# Patient Record
Sex: Male | Born: 1935 | Race: Black or African American | Hispanic: No | Marital: Single | Smoking: Former smoker
Health system: Southern US, Community
[De-identification: ages and names within clinical notes are randomized; demographics above are authoritative.]

## PROBLEM LIST (undated history)

## (undated) DIAGNOSIS — I10 Essential (primary) hypertension: Secondary | ICD-10-CM

## (undated) DIAGNOSIS — R0602 Shortness of breath: Secondary | ICD-10-CM

## (undated) DIAGNOSIS — J449 Chronic obstructive pulmonary disease, unspecified: Secondary | ICD-10-CM

## (undated) DIAGNOSIS — K219 Gastro-esophageal reflux disease without esophagitis: Secondary | ICD-10-CM

## (undated) DIAGNOSIS — M1990 Unspecified osteoarthritis, unspecified site: Secondary | ICD-10-CM

## (undated) DIAGNOSIS — H9190 Unspecified hearing loss, unspecified ear: Secondary | ICD-10-CM

## (undated) HISTORY — DX: Gastro-esophageal reflux disease without esophagitis: K21.9

## (undated) HISTORY — PX: EYE SURGERY: SHX253

## (undated) HISTORY — DX: Essential (primary) hypertension: I10

---

## 1998-01-07 HISTORY — PX: CYSTOSCOPY: SHX5120

## 2000-12-16 ENCOUNTER — Ambulatory Visit (HOSPITAL_COMMUNITY): Admission: RE | Admit: 2000-12-16 | Discharge: 2000-12-16 | Payer: Self-pay | Admitting: Internal Medicine

## 2000-12-16 ENCOUNTER — Encounter: Payer: Self-pay | Admitting: Internal Medicine

## 2000-12-19 ENCOUNTER — Ambulatory Visit (HOSPITAL_COMMUNITY): Admission: RE | Admit: 2000-12-19 | Discharge: 2000-12-19 | Payer: Self-pay | Admitting: Internal Medicine

## 2000-12-19 ENCOUNTER — Encounter: Payer: Self-pay | Admitting: Internal Medicine

## 2002-02-18 ENCOUNTER — Ambulatory Visit (HOSPITAL_COMMUNITY): Admission: RE | Admit: 2002-02-18 | Discharge: 2002-02-18 | Payer: Self-pay | Admitting: Internal Medicine

## 2002-02-19 ENCOUNTER — Encounter (INDEPENDENT_AMBULATORY_CARE_PROVIDER_SITE_OTHER): Payer: Self-pay | Admitting: Internal Medicine

## 2002-02-19 ENCOUNTER — Ambulatory Visit (HOSPITAL_COMMUNITY): Admission: RE | Admit: 2002-02-19 | Discharge: 2002-02-19 | Payer: Self-pay | Admitting: Internal Medicine

## 2002-08-05 ENCOUNTER — Emergency Department (HOSPITAL_COMMUNITY): Admission: EM | Admit: 2002-08-05 | Discharge: 2002-08-05 | Payer: Self-pay | Admitting: Internal Medicine

## 2002-08-18 ENCOUNTER — Encounter: Payer: Self-pay | Admitting: Internal Medicine

## 2002-08-18 ENCOUNTER — Ambulatory Visit (HOSPITAL_COMMUNITY): Admission: RE | Admit: 2002-08-18 | Discharge: 2002-08-18 | Payer: Self-pay | Admitting: Internal Medicine

## 2002-08-26 ENCOUNTER — Emergency Department (HOSPITAL_COMMUNITY): Admission: EM | Admit: 2002-08-26 | Discharge: 2002-08-26 | Payer: Self-pay | Admitting: Emergency Medicine

## 2002-08-26 ENCOUNTER — Encounter: Payer: Self-pay | Admitting: Emergency Medicine

## 2002-08-31 ENCOUNTER — Ambulatory Visit (HOSPITAL_COMMUNITY): Admission: RE | Admit: 2002-08-31 | Discharge: 2002-08-31 | Payer: Self-pay | Admitting: Internal Medicine

## 2003-06-14 ENCOUNTER — Emergency Department (HOSPITAL_COMMUNITY): Admission: EM | Admit: 2003-06-14 | Discharge: 2003-06-14 | Payer: Self-pay | Admitting: Emergency Medicine

## 2003-06-27 ENCOUNTER — Ambulatory Visit (HOSPITAL_COMMUNITY): Admission: RE | Admit: 2003-06-27 | Discharge: 2003-06-27 | Payer: Self-pay | Admitting: Urology

## 2003-09-04 ENCOUNTER — Emergency Department (HOSPITAL_COMMUNITY): Admission: EM | Admit: 2003-09-04 | Discharge: 2003-09-04 | Payer: Self-pay | Admitting: Emergency Medicine

## 2003-10-24 ENCOUNTER — Emergency Department (HOSPITAL_COMMUNITY): Admission: EM | Admit: 2003-10-24 | Discharge: 2003-10-24 | Payer: Self-pay | Admitting: Emergency Medicine

## 2003-11-09 ENCOUNTER — Ambulatory Visit: Payer: Self-pay | Admitting: Orthopedic Surgery

## 2004-03-28 ENCOUNTER — Ambulatory Visit: Payer: Self-pay | Admitting: Internal Medicine

## 2004-03-28 ENCOUNTER — Ambulatory Visit (HOSPITAL_COMMUNITY): Admission: RE | Admit: 2004-03-28 | Discharge: 2004-03-28 | Payer: Self-pay | Admitting: Internal Medicine

## 2005-02-21 ENCOUNTER — Ambulatory Visit: Payer: Self-pay | Admitting: Internal Medicine

## 2005-04-01 ENCOUNTER — Ambulatory Visit (HOSPITAL_COMMUNITY): Admission: RE | Admit: 2005-04-01 | Discharge: 2005-04-01 | Payer: Self-pay | Admitting: General Surgery

## 2005-08-13 ENCOUNTER — Ambulatory Visit (HOSPITAL_COMMUNITY): Admission: RE | Admit: 2005-08-13 | Discharge: 2005-08-13 | Payer: Self-pay | Admitting: Internal Medicine

## 2006-02-27 ENCOUNTER — Ambulatory Visit: Payer: Self-pay | Admitting: Internal Medicine

## 2006-03-03 ENCOUNTER — Ambulatory Visit (HOSPITAL_COMMUNITY): Admission: RE | Admit: 2006-03-03 | Discharge: 2006-03-03 | Payer: Self-pay | Admitting: Internal Medicine

## 2006-04-24 ENCOUNTER — Ambulatory Visit (HOSPITAL_COMMUNITY): Admission: RE | Admit: 2006-04-24 | Discharge: 2006-04-24 | Payer: Self-pay | Admitting: Ophthalmology

## 2006-05-08 ENCOUNTER — Ambulatory Visit (HOSPITAL_COMMUNITY): Admission: RE | Admit: 2006-05-08 | Discharge: 2006-05-08 | Payer: Self-pay | Admitting: Ophthalmology

## 2007-04-14 ENCOUNTER — Ambulatory Visit (HOSPITAL_COMMUNITY): Admission: RE | Admit: 2007-04-14 | Discharge: 2007-04-14 | Payer: Self-pay | Admitting: Internal Medicine

## 2007-04-16 ENCOUNTER — Ambulatory Visit (HOSPITAL_COMMUNITY): Admission: RE | Admit: 2007-04-16 | Discharge: 2007-04-16 | Payer: Self-pay | Admitting: Internal Medicine

## 2008-10-14 ENCOUNTER — Encounter: Payer: Self-pay | Admitting: Orthopedic Surgery

## 2008-10-17 ENCOUNTER — Ambulatory Visit: Payer: Self-pay | Admitting: Orthopedic Surgery

## 2008-10-17 DIAGNOSIS — M7580 Other shoulder lesions, unspecified shoulder: Secondary | ICD-10-CM

## 2008-10-17 DIAGNOSIS — M75120 Complete rotator cuff tear or rupture of unspecified shoulder, not specified as traumatic: Secondary | ICD-10-CM | POA: Insufficient documentation

## 2008-10-17 DIAGNOSIS — M25519 Pain in unspecified shoulder: Secondary | ICD-10-CM | POA: Insufficient documentation

## 2008-11-10 ENCOUNTER — Encounter: Payer: Self-pay | Admitting: Urgent Care

## 2009-01-03 ENCOUNTER — Encounter: Payer: Self-pay | Admitting: Gastroenterology

## 2009-06-20 ENCOUNTER — Ambulatory Visit (HOSPITAL_COMMUNITY): Admission: RE | Admit: 2009-06-20 | Discharge: 2009-06-20 | Payer: Self-pay | Admitting: Internal Medicine

## 2009-07-24 ENCOUNTER — Encounter: Payer: Self-pay | Admitting: Emergency Medicine

## 2009-07-25 ENCOUNTER — Ambulatory Visit: Payer: Self-pay | Admitting: Pulmonary Disease

## 2009-07-25 ENCOUNTER — Ambulatory Visit: Payer: Self-pay | Admitting: Internal Medicine

## 2009-07-25 ENCOUNTER — Inpatient Hospital Stay (HOSPITAL_COMMUNITY): Admission: EM | Admit: 2009-07-25 | Discharge: 2009-07-26 | Payer: Self-pay | Admitting: Internal Medicine

## 2009-07-25 ENCOUNTER — Encounter: Payer: Self-pay | Admitting: Cardiovascular Disease

## 2009-08-11 ENCOUNTER — Encounter: Payer: Self-pay | Admitting: *Deleted

## 2009-08-13 ENCOUNTER — Inpatient Hospital Stay (HOSPITAL_COMMUNITY): Admission: EM | Admit: 2009-08-13 | Discharge: 2009-08-18 | Payer: Self-pay | Admitting: Emergency Medicine

## 2009-08-17 ENCOUNTER — Encounter (INDEPENDENT_AMBULATORY_CARE_PROVIDER_SITE_OTHER): Payer: Self-pay | Admitting: *Deleted

## 2010-02-06 NOTE — Letter (Signed)
Summary: Appointment - Missed  [facility] [place]  [address] [place] [address]   Phone: [phone]  Fax: [phone]     August 17, 2009 MRN: [id number]   Kurt Burke [address] [name], [place]  [address]   Dear [name],  Our records indicate you missed your appointment on      08/17/09                  with Dr.       .      Ladona Ridgel                              It is very important that we reach you to reschedule this appointment. We look forward to participating in your health care needs. Please contact us at the number listed above at your earliest convenience to reschedule this appointment.     Sincerely,    Glass blower/designer

## 2010-03-23 LAB — CBC
HCT: 36.3 % — ABNORMAL LOW (ref 39.0–52.0)
HCT: 39.5 % (ref 39.0–52.0)
Hemoglobin: 13.4 g/dL (ref 13.0–17.0)
Hemoglobin: 14.1 g/dL (ref 13.0–17.0)
MCHC: 33.6 g/dL (ref 30.0–36.0)
MCHC: 33.8 g/dL (ref 30.0–36.0)
MCV: 91.6 fL (ref 78.0–100.0)
Platelets: 200 10*3/uL (ref 150–400)
Platelets: 203 10*3/uL (ref 150–400)
RBC: 3.93 MIL/uL — ABNORMAL LOW (ref 4.22–5.81)
RBC: 4.29 MIL/uL (ref 4.22–5.81)
RBC: 4.58 MIL/uL (ref 4.22–5.81)
RDW: 13.2 % (ref 11.5–15.5)
RDW: 13.5 % (ref 11.5–15.5)
WBC: 4.4 10*3/uL (ref 4.0–10.5)
WBC: 5.4 10*3/uL (ref 4.0–10.5)

## 2010-03-23 LAB — DIFFERENTIAL
Basophils Absolute: 0 10*3/uL (ref 0.0–0.1)
Basophils Relative: 1 % (ref 0–1)
Basophils Relative: 1 % (ref 0–1)
Basophils Relative: 1 % (ref 0–1)
Eosinophils Absolute: 0.2 10*3/uL (ref 0.0–0.7)
Eosinophils Relative: 4 % (ref 0–5)
Lymphocytes Relative: 30 % (ref 12–46)
Lymphocytes Relative: 33 % (ref 12–46)
Lymphs Abs: 1.6 10*3/uL (ref 0.7–4.0)
Lymphs Abs: 2.5 10*3/uL (ref 0.7–4.0)
Monocytes Absolute: 0.4 10*3/uL (ref 0.1–1.0)
Monocytes Relative: 14 % — ABNORMAL HIGH (ref 3–12)
Monocytes Relative: 9 % (ref 3–12)
Neutro Abs: 2.3 10*3/uL (ref 1.7–7.7)
Neutro Abs: 2.3 10*3/uL (ref 1.7–7.7)
Neutro Abs: 3 10*3/uL (ref 1.7–7.7)
Neutrophils Relative %: 42 % — ABNORMAL LOW (ref 43–77)
Neutrophils Relative %: 42 % — ABNORMAL LOW (ref 43–77)

## 2010-03-23 LAB — LUPUS ANTICOAGULANT PANEL
DRVVT: 46.8 secs — ABNORMAL HIGH (ref 36.2–44.3)
Lupus Anticoagulant: NOT DETECTED
PTTLA 4:1 Mix: 58.1 secs — ABNORMAL HIGH (ref 30.0–45.6)
dRVVT Incubated 1:1 Mix: 40.5 secs (ref 36.2–44.3)

## 2010-03-23 LAB — BASIC METABOLIC PANEL
BUN: 13 mg/dL (ref 6–23)
Calcium: 10.1 mg/dL (ref 8.4–10.5)
Chloride: 105 mEq/L (ref 96–112)
Creatinine, Ser: 1.19 mg/dL (ref 0.4–1.5)
GFR calc Af Amer: >60 mL/min (ref >60)
GFR calc Af Amer: >60 mL/min (ref >60)
GFR calc non Af Amer: 50 mL/min — ABNORMAL LOW (ref >60)
GFR calc non Af Amer: 59 mL/min — ABNORMAL LOW (ref >60)
Potassium: 3.7 mEq/L (ref 3.5–5.1)
Sodium: 139 mEq/L (ref 135–145)

## 2010-03-23 LAB — COMPREHENSIVE METABOLIC PANEL
ALT: 14 U/L (ref 0–53)
AST: 22 U/L (ref 0–37)
Albumin: 3.4 g/dL — ABNORMAL LOW (ref 3.5–5.2)
Alkaline Phosphatase: 55 U/L (ref 39–117)
Alkaline Phosphatase: 73 U/L (ref 39–117)
BUN: 11 mg/dL (ref 6–23)
CO2: 28 mEq/L (ref 19–32)
Chloride: 105 mEq/L (ref 96–112)
GFR calc Af Amer: 59 mL/min — ABNORMAL LOW (ref >60)
GFR calc Af Amer: >60 mL/min (ref >60)
GFR calc non Af Amer: 49 mL/min — ABNORMAL LOW (ref >60)
Glucose, Bld: 118 mg/dL — ABNORMAL HIGH (ref 70–99)
Potassium: 3.4 mEq/L — ABNORMAL LOW (ref 3.5–5.1)
Potassium: 3.8 mEq/L (ref 3.5–5.1)
Sodium: 139 mEq/L (ref 135–145)
Total Bilirubin: 0.4 mg/dL (ref 0.3–1.2)
Total Protein: 6.4 g/dL (ref 6.0–8.3)

## 2010-03-23 LAB — D-DIMER, QUANTITATIVE: D-Dimer, Quant: 1.56 ug/mL-FEU — ABNORMAL HIGH (ref 0.00–0.48)

## 2010-03-23 LAB — PROTIME-INR
INR: 1.79 — ABNORMAL HIGH (ref 0.00–1.49)
Prothrombin Time: 13.5 seconds (ref 11.6–15.2)
Prothrombin Time: 14.5 seconds (ref 11.6–15.2)
Prothrombin Time: 19.7 seconds — ABNORMAL HIGH (ref 11.6–15.2)
Prothrombin Time: 21 seconds — ABNORMAL HIGH (ref 11.6–15.2)

## 2010-03-23 LAB — POCT CARDIAC MARKERS: Troponin i, poc: <0.05 ng/mL (ref 0.00–0.09)

## 2010-03-23 LAB — URINALYSIS, ROUTINE W REFLEX MICROSCOPIC
Hgb urine dipstick: NEGATIVE
Nitrite: NEGATIVE
Protein, ur: NEGATIVE mg/dL
Urobilinogen, UA: 1 mg/dL (ref 0.0–1.0)

## 2010-03-23 LAB — CK TOTAL AND CKMB (NOT AT ARMC)
CK, MB: 4.3 ng/mL — ABNORMAL HIGH (ref 0.3–4.0)
CK, MB: 5.5 ng/mL — ABNORMAL HIGH (ref 0.3–4.0)
Relative Index: 2.3 (ref 0.0–2.5)
Total CK: 208 U/L (ref 7–232)

## 2010-03-23 LAB — CARDIOLIPIN ANTIBODIES, IGG, IGM, IGA
Anticardiolipin IgA: 2 APL U/mL — ABNORMAL LOW (ref <22)
Anticardiolipin IgG: 9 GPL U/mL — ABNORMAL LOW (ref <23)

## 2010-03-23 LAB — MRSA PCR SCREENING: MRSA by PCR: POSITIVE — AB

## 2010-03-23 LAB — BETA-2-GLYCOPROTEIN I ABS, IGG/M/A: Beta-2 Glyco I IgG: 2 G Units (ref <20)

## 2010-03-23 LAB — BRAIN NATRIURETIC PEPTIDE: Pro B Natriuretic peptide (BNP): <30 pg/mL (ref 0.0–100.0)

## 2010-03-23 LAB — PROTHROMBIN GENE MUTATION

## 2010-03-23 LAB — TROPONIN I: Troponin I: 0.01 ng/mL (ref 0.00–0.06)

## 2010-03-23 LAB — ANTITHROMBIN III: AntiThromb III Func: 95 % (ref 76–126)

## 2010-03-23 LAB — APTT: aPTT: 59 seconds — ABNORMAL HIGH (ref 24–37)

## 2010-03-24 LAB — PROTIME-INR
INR: 1.17 (ref 0.00–1.49)
Prothrombin Time: 14.8 seconds (ref 11.6–15.2)

## 2010-03-24 LAB — CARDIAC PANEL(CRET KIN+CKTOT+MB+TROPI)
CK, MB: 4.7 ng/mL — ABNORMAL HIGH (ref 0.3–4.0)
CK, MB: 5.6 ng/mL — ABNORMAL HIGH (ref 0.3–4.0)
CK, MB: 5.7 ng/mL — ABNORMAL HIGH (ref 0.3–4.0)
Relative Index: 2.4 (ref 0.0–2.5)
Total CK: 199 U/L (ref 7–232)
Total CK: 237 U/L — ABNORMAL HIGH (ref 7–232)
Total CK: 276 U/L — ABNORMAL HIGH (ref 7–232)
Troponin I: 0.11 ng/mL — ABNORMAL HIGH (ref 0.00–0.06)

## 2010-03-24 LAB — LIPID PANEL
Cholesterol: 204 mg/dL — ABNORMAL HIGH (ref 0–200)
LDL Cholesterol: 143 mg/dL — ABNORMAL HIGH (ref 0–99)
Total CHOL/HDL Ratio: 4.1 RATIO

## 2010-03-24 LAB — MRSA PCR SCREENING: MRSA by PCR: NEGATIVE

## 2010-03-24 LAB — POCT CARDIAC MARKERS
CKMB, poc: 1.2 ng/mL (ref 1.0–8.0)
Myoglobin, poc: 220 ng/mL (ref 12–200)
Troponin i, poc: <0.05 ng/mL (ref 0.00–0.09)

## 2010-03-24 LAB — DIFFERENTIAL
Basophils Absolute: 0 10*3/uL (ref 0.0–0.1)
Lymphocytes Relative: 3 % — ABNORMAL LOW (ref 12–46)
Lymphs Abs: 0.4 10*3/uL — ABNORMAL LOW (ref 0.7–4.0)
Neutro Abs: 12.6 10*3/uL — ABNORMAL HIGH (ref 1.7–7.7)
Neutrophils Relative %: 92 % — ABNORMAL HIGH (ref 43–77)

## 2010-03-24 LAB — COMPREHENSIVE METABOLIC PANEL
ALT: 18 U/L (ref 0–53)
Alkaline Phosphatase: 63 U/L (ref 39–117)
BUN: 14 mg/dL (ref 6–23)
CO2: 26 mEq/L (ref 19–32)
Calcium: 9.5 mg/dL (ref 8.4–10.5)
GFR calc non Af Amer: 60 mL/min — ABNORMAL LOW (ref >60)
Glucose, Bld: 98 mg/dL (ref 70–99)
Potassium: 3.8 mEq/L (ref 3.5–5.1)
Sodium: 139 mEq/L (ref 135–145)
Total Protein: 6.9 g/dL (ref 6.0–8.3)

## 2010-03-24 LAB — BASIC METABOLIC PANEL
Calcium: 9.8 mg/dL (ref 8.4–10.5)
Chloride: 107 mEq/L (ref 96–112)
Creatinine, Ser: 1.33 mg/dL (ref 0.4–1.5)
GFR calc Af Amer: >60 mL/min (ref >60)
GFR calc non Af Amer: 52 mL/min — ABNORMAL LOW (ref >60)

## 2010-03-24 LAB — CBC
MCV: 92.2 fL (ref 78.0–100.0)
Platelets: 194 10*3/uL (ref 150–400)
RBC: 4.3 MIL/uL (ref 4.22–5.81)
WBC: 13.7 10*3/uL — ABNORMAL HIGH (ref 4.0–10.5)

## 2010-03-24 LAB — TROPONIN I: Troponin I: 0.14 ng/mL — ABNORMAL HIGH (ref 0.00–0.06)

## 2010-03-24 LAB — BLOOD GAS, ARTERIAL
Acid-Base Excess: 1.7 mmol/L (ref 0.0–2.0)
Bicarbonate: 25.7 mEq/L — ABNORMAL HIGH (ref 20.0–24.0)
Patient temperature: 37
TCO2: 22.8 mmol/L (ref 0–100)

## 2010-03-24 LAB — D-DIMER, QUANTITATIVE: D-Dimer, Quant: 0.52 ug/mL-FEU — ABNORMAL HIGH (ref 0.00–0.48)

## 2010-03-24 LAB — CK TOTAL AND CKMB (NOT AT ARMC): Relative Index: 1.8 (ref 0.0–2.5)

## 2010-03-24 LAB — BRAIN NATRIURETIC PEPTIDE: Pro B Natriuretic peptide (BNP): 35 pg/mL (ref 0.0–100.0)

## 2010-05-25 NOTE — H&P (Signed)
NAME:  Kurt Burke, Kurt Burke                       ACCOUNT NO.:  [id number]   MEDICAL RECORD NO.:  [id number]                   PATIENT TYPE:  AMB   LOCATION:  DAY                                  FACILITY:  [facility]   PHYSICIAN:  Dennie Maizes, M.D.                DATE OF BIRTH:  01/14/1929   DATE OF ADMISSION:  06/27/2003  DATE OF DISCHARGE:                                HISTORY & PHYSICAL   CHIEF COMPLAINT:  Scrotal swelling, elevated PSA.   HISTORY OF PRESENT ILLNESS:  This 77 year old male was referred to me by Dr.  Jerolyn Shin C. Smith for evaluation of elevated PSA.  The patient had a PSA of  7.56 ng/mL.  He was treated with a course of antibiotics.  His repeat PSA  has come down to 3.0 ng/mL.   The patient complains of having bilateral scrotal swelling which has been  increasing in size.  He has had the swelling for several years.  The  swellings are increasing in size and causes him some discomfort.  The  patient denies having any injury to the scrotum.  He denies having any  voiding difficulty.  He has urinary frequency x3-4 and nocturia x0.  There  is no past history of hematuria, urolithiasis, urinary tract infections or  chronic prostatitis.   PAST MEDICAL HISTORY:  History of:  1. Hypertension.  2. GERD.  3. COPD.   MEDICATIONS:  __________ for hypertension, albuterol inhaler, ibuprofen,  which has been discontinued for the surgery.   ALLERGIES:  CODEINE.   FAMILY HISTORY:  Family history is positive for carcinoma of prostate, heart  disease and valvular heart disease.   EXAMINATION:  GENERAL:  Height 5 feet 5 inches.  Weight 209 pounds.  HEENT:  Normal.  LUNGS:  Lungs clear to auscultation.  HEART:  Regular rate and rhythm.  No murmurs.  ABDOMEN:  Abdomen soft.  No palpable flank mass.  No CVA tenderness.  Bladder not palpable.  GU:  Penis normal.  Bilateral large hydroceles are noted; the left-sided  hydrocele is larger than the right side.  Testes could not be  palpated.  RECTAL:  Forty-five-gram-size benign prostate.   IMPRESSION:  1. Bilateral hydroceles.  2. Benign prostatic hypertrophy.   PLAN:  Bilateral hydrocelectomies under anesthesia in day hospital.  I have  discussed with the patient regarding the diagnosis, operative details, the  alternative treatments, the outcomes, the possible risks and complications  and he has agreed for the patient to be done.     ___________________________________________                                         Dennie Maizes, M.D.   SK/MEDQ  D:  06/26/2003  T:  06/27/2003  Job:  [id number]   cc:   Dirk Dress. Katrinka Blazing, M.D.  [address]  [place]  [place] [address]  Fax: [phone]

## 2010-05-25 NOTE — Op Note (Signed)
NAME:  Kurt Burke, Kurt Burke                       ACCOUNT NO.:  [id number]   MEDICAL RECORD NO.:  [id number]                   PATIENT TYPE:  AMB   LOCATION:  DAY                                  FACILITY:  [facility]   PHYSICIAN:  Lionel December, M.D.                 DATE OF BIRTH:  1929-05-15   DATE OF PROCEDURE:  02/18/2002  DATE OF DISCHARGE:                                 OPERATIVE REPORT   PROCEDURE:  Esophagogastroduodenoscopy followed by total colonoscopy with  polypectomy.   INDICATION:  Kurt Burke is a 77 year old, African-American male, who has  chronic GERD.  He has been maintained on PPI, but his symptoms are very  poorly controlled.  He has frequent heartburn and regurgitation.  He also  gives history of rectal bleeding.  He was hospitalized at __________ seen  September 2002 with liver abscess.  At that time, he was anemic and  documented to have iron deficiency.  According to records received,  colonoscopy was attempted but prep was poor, and it was not completed.  To  date, he has never had GI evaluation.  However, recent hemoglobin was in mid  __________ range.  He has intermittent hematochezia.  He is undergoing a  diagnostic evaluation.  Procedure risks were reviewed with the patient.  Informed consent was obtained.   PREOPERATIVE MEDICATIONS:  Cetacaine spray for pharyngeal topical  anesthesia, Demerol 25 mg IV, Versed 4 mg IV in divided dose.   INSTRUMENT:  Olympus video system.   FINDINGS:  Procedure performed in endoscopy suite.  The patient's vital  signs and O2 saturations were monitored during the procedure and remained  stable.   PROCEDURE #1 ESOPHAGOGASTRODUODENOSCOPY:  The patient was placed in left  lateral position.  Endoscope was passed via pharynx without any difficulty  into esophagus.   ESOPHAGUS:  Mucosa of the proximal mid was normal.  Distal 6-7 cm revealed  three columns of esophageal varices which were felt to be at least grade 2.  There  were no real markings or stigmata of bleeding.  He had few erosions  distal esophagus and a large ulcer and two ulcers at esophagogastric  junction.  In this area, he also had a pseudodiverticulum.  There was a  moderate-size sliding hiatal hernia with the dependent portion on the left  side.  There was no stricture at GE junction.   STOMACH:  It was empty and distended very well with insufflation.  Folds of  the proximal stomach are normal.  Examination of the mucosa revealed some  erythema, but there were no erosions or ulcers noted.  Pyloric channel was  patent.  Angularis, fundus, and cardia were examined by retroflexing the  scope and were normal.  I did not see fundal varices.   DUODENUM:  Exam of the bulb;  The second and third part of the duodenum was  normal.  Endoscope was withdrawn, and the patient was  prepared for procedure  #2.   PROCEDURE #2 TOTAL COLONOSCOPY:  Total colonoscopy.  Rectal examination  performed.  No abnormality noted on external or digital exam.  Scope was  placed in the rectum and advanced under vision into the sigmoid colon.  He  had multiple large diverticula at sigmoid colon.  He had some formed stool  scattered throughout his colon.  I was able to pass the scope to the cecum,  which was identified by ileocecal valve and appendiceal orifice.  Pictures  taken for the record.  There were few small scattered diverticula proximal  to splenic flexure.  There was about 10-12 mm pedunculated polyp at mid  sigmoid colon with ulcerated surface.  This was snared and retrieved for  histologic examination.  Rectal mucosa was normal.  Scope was retroflexed to  examine the anorectal junction, which was unremarkable.  Endoscope was  straightened and withdrawn.  The patient tolerated the procedure well.   FINAL DIAGNOSES:  Multiple findings which include:  1. Three columns of grade 2 esophageal varices which is worrisome for     underlying cirrhosis.  2. Ulcerative  reflux esophagitis with moderate-sized sliding hiatal hernia.  3. Pancolonic diverticula.  Most of the diverticula, however, were in the     sigmoid colon.  4. About 10-12 mm pedunculated polyp, snared from the sigmoid colon.   RECOMMENDATIONS:  1. Antireflux measures reinforced.  2. Will increase his Prevacid to 30 mg b.i.d. for the next 8-12 weeks.  New     prescription given for a month with 2 refills.  3. Will check LFTs and prothrombin time today.  4. High fiber diet and Citrucel 1 tbsp. full daily.  5. Will arrange for abdominal ultrasound with attention to liver, spleen,     hepatoportal venous system, including Doppler study.  6. He was also asked not to take aspirin for 10 days.  7. I will contact patient with results of these studies and further     recommendations.                                               Lionel December, M.D.    NR/MEDQ  D:  02/18/2002  T:  02/18/2002  Job:  [id number]   cc:   Kurt Burke, M.D.  [address]  [place]  [place] [address]  Fax: [phone]

## 2010-05-25 NOTE — Procedures (Signed)
   NAME:  JEF, FUTCH                       ACCOUNT NO.:  [id number]   MEDICAL RECORD NO.:  [id number]                   PATIENT TYPE:  EMS   LOCATION:  ED                                   FACILITY:  [facility]   PHYSICIAN:  Edward L. Juanetta Gosling, M.D.             DATE OF BIRTH:  1929-12-25   DATE OF PROCEDURE:  08/26/2002  DATE OF DISCHARGE:  08/26/2002                                EKG INTERPRETATION   1755 hours on August 26, 2002.   RESULTS:  The rhythm is sinus rhythm with a tachycardic rate of about 115.  There are Q waves inferiorly which could indicate a previous inferior  myocardial infarction.  There is suggestion of atrial enlargement.  There  are diffuse, but nonspecific ST abnormalities.   IMPRESSION:  Abnormal electrocardiogram.                                               Edward L. Juanetta Gosling, M.D.    ELH/MEDQ  D:  09/06/2002  T:  09/07/2002  Job:  [id number]

## 2010-05-25 NOTE — Procedures (Signed)
   NAME:  Kurt Burke, Kurt Burke                       ACCOUNT NO.:  [id number]   MEDICAL RECORD NO.:  [id number]                   PATIENT TYPE:  OUT   LOCATION:  RAD                                  FACILITY:  [facility]   PHYSICIAN:  Vida Roller, M.D.                DATE OF BIRTH:  1929-09-22   DATE OF PROCEDURE:  DATE OF DISCHARGE:                                  ECHOCARDIOGRAM   TAPE NUMBER:  [id number]   TAPE COUNT:  [id number]   CLINICAL INFORMATION:  This is a 77 year old male with dyspnea and  hypertension.   TECHNICAL QUALITY:  Adequate   M-MODE TRACINGS:  1. The aorta is 35 mm.  2. The left atrium is 40 mm.  3. The septum is 13 mm.  4. The posterior wall is 13 mm.  5. The left ventricular diastolic dimension is 54 mm.  6. The left ventricular systolic dimension is 47 mm.   2-D AND DOPPLER IMAGING:  1. The left ventricle is normal size with moderate left ventricular     hypertrophy in a concentric manner.  The left ventricular systolic     function is depressed at 45-50% with evidence of inferior, inferior-     posterior hypokinesis.  Diastolic function was not assessed.  2. The right ventricle is normal size with normal systolic function.  3. Both atria are at the top end of normal.  4. The aortic valve is sclerotic with no evidence of stenosis or     regurgitation.  5. The mitral valve is morphologically unremarkable with trace     insufficiency.  No stenosis is seen.  6. The tricuspid valve is morphologically unremarkable with mild     insufficiency.  No stenosis is seen.  7. The pulmonic valve is not well seen.  8. The inferior vena cava is not well seen.  The pericardial structures     appear to be normal.  The ascending aorta was not well seen.                                               Vida Roller, M.D.    JH/MEDQ  D:  08/31/2002  T:  09/01/2002  Job:  [id number]

## 2010-05-25 NOTE — Op Note (Signed)
Burke, Kurt             ACCOUNT NO.:  [id number]   MEDICAL RECORD NO.:  [id number]          PATIENT TYPE:  AMB   LOCATION:  DAY                           FACILITY:  [facility]   PHYSICIAN:  Lionel December, M.D.    DATE OF BIRTH:  06/23/1929   DATE OF PROCEDURE:  03/28/2004  DATE OF DISCHARGE:                                 OPERATIVE REPORT   PROCEDURE:  Esophagogastroduodenoscopy with esophageal dilation.   INDICATIONS FOR PROCEDURE:  Kurt Burke is a 77 year old African/American male  who has known esophageal varices presumed to be secondary to cirrhosis,  which has not been confirmed cirrhosis, whose last EGD was in February 2004,  revealing three columns of grade 2 esophageal varices. Follow-up EGD was  therefore advised. He is now complaining of dysphagia, primarily to solids.  He has a history of complicated gastroesophageal reflux disease.  He states  his heartburn is well-controlled with a PPI, and he may have it occasionally  with certain foods. He had melena a few months ago. His CBC was normal one  year ago, but was 10.9 and 34.0 in November 2005. He has not had a follow-up  test.   The procedure and risks were reviewed with the patient. An informed consent  was obtained.   PREOPERATIVE MEDICATIONS:  Cetacaine spray for pharyngeal topical  anesthesia, Demerol 25 milligrams IV and Versed 4 milligrams IV in divided  dose.   </FINDINGS/DESCRIPTION OF PROCEDURE>  The procedure was performed in the endoscopy suite. The patient's vital  signs and O2 sat were monitored during the procedure and remained stable.  The patient was placed in left lateral recumbent position, and the Olympus  video scope was passed through the oropharynx without any difficulty, into  the esophagus.   Esophagus. There were three columns of esophageal varices involving the  distal 8-9 cm. One column at 2 or 3 o'clock was at least grade two.  The  other ones were smaller. There were a few erosions  noted in the distal 5 cm.  One of the erosions was overlying the varix, but there was no bleeding.  There was a 5 mm ux 5 mm ulcer at the GE junction with a stricture. The  scope  was easily passed through it. The GE junction was at 35 cm, and the  hiatus was at 40 cm. He had a hiatal hernia which was felt to be moderate-  sized.   Stomach:  The stomach was empty and distended very well with insufflation.  The folds in the proximal stomach were normal. Examination of the mucosa at  body, antrum, pyloric channel, as well as the fundus and cardia were  normal. No fundal varices were identified.   Duodenum:  The bulbar mucosa was normal. The scope was passed into the  second part of the duodenum. The mucosa and folds were normal. The endoscope  was pulled back into the stomach.  A balloon  dilator was advanced through  the scope and positioned across the stricture by withdrawing the scope into  the body of the esophagus. The stricture was dilated to 15 mm, 16.5  mm and  18 mm successively. There was only a very slight mucosal disruption at the  level of the ulcer, but no bleeding was noted. Pictures were taken for the  record. The endoscope was withdrawn. The patient tolerated the procedure  well.   FINAL DIAGNOSIS:  1.  Three columns of grade 1 to 2 esophageal varices. These have not changed      since the previous esophagogastroduodenoscopy of two years ago.  2.  Erosive/ ulcerative esophagitis with stricture at the gastroesophageal      junction, which was dilated to 18 mm with a balloon.  3.  Moderate-sized sliding hiatal hernia.  4.  Normal examination of the stomach and first and second part of duodenum.   RECOMMENDATIONS:  1.  Will check a CBC and LFTs today.  2.  Anti-reflux measures reinforced.  3.  Will increase Prevacid to 30 milligrams p.o. b.i.d..   He will return for an OV in two months from now.      NR/MEDQ  D:  03/28/2004  T:  03/28/2004  Job:  [id number]

## 2010-05-25 NOTE — Op Note (Signed)
NAME:  Kurt Burke, Kurt Burke                       ACCOUNT NO.:  [id number]   MEDICAL RECORD NO.:  [id number]                   PATIENT TYPE:  AMB   LOCATION:  DAY                                  FACILITY:  [facility]   PHYSICIAN:  Dennie Maizes, M.D.                DATE OF BIRTH:  September 14, 1929   DATE OF PROCEDURE:  06/27/2003  DATE OF DISCHARGE:                                 OPERATIVE REPORT   PREOPERATIVE DIAGNOSIS:  Large bilateral hydrocele.   POSTOPERATIVE DIAGNOSIS:  Large bilateral hydrocele.   OPERATION/PROCEDURE:  Bilateral hydrocelectomy.   ANESTHESIA:  Spinal.   SURGEON:  Dennie Maizes, M.D.   COMPLICATIONS:  None.   ESTIMATED BLOOD LOSS:  Minimal.   INDICATIONS FOR PROCEDURE:  This 77 year old male with a large symptomatic  bilateral hydrocele was taken to the OR today for bilateral hydrocelectomy  under anesthesia.   DESCRIPTION OF PROCEDURE:  Spinal anesthesia was induced and the patient was  placed on the OR table in the supine position.  The lower abdomen and  genitalia were prepped and draped in a sterile fashion.  A transverse  anterior scrotal incision was then made.  The hydrocele sac on the left side  was then separated by sharp and blunt dissection.  The hydrocele sac was  then delivered out of the incision.  An incision was made in the tunica and  about 500 cc of clear amber-colored fluid was drained.  Examination of the  testes and epididymis was normal.  The hydrocele sac was then reversed.  Hemostasis was obtained by cauterization.  The sac was then approximated  using 3-0 Vicryl.  A similar procedure was done on the right side and the  hydrocele repair was done.  Both the testes were then replaced in the  scrotal cavity.  The scrotal cavity was closely examined and complete  hemostasis was obtained by cauterization.  The scrotal wall was then closed  in two layers using 3-0 chromic.  The skin was closed using 3-0  chromic.  The estimated blood loss was  minimal.  The sponges and instruments  were correct x2 at the time of closure.  The patient was transferred to the  PACU in a satisfactory condition.  A scrotal support was applied before the  transfer.      ___________________________________________                                            Dennie Maizes, M.D.   SK/MEDQ  D:  06/27/2003  T:  06/28/2003  Job:  [id number]   cc:   Dirk Dress. Katrinka Blazing, M.D.  [address]  [place]  [place] [address]  Fax: [phone]

## 2010-05-25 NOTE — Procedures (Signed)
NAMEMITCHELL, IWANICKI             ACCOUNT NO.:  [id number]   MEDICAL RECORD NO.:  [id number]          PATIENT TYPE:  OUT   LOCATION:  RESP                          FACILITY:  [facility]   PHYSICIAN:  Edward L. Juanetta Gosling, M.D.DATE OF BIRTH:  [birth year]   DATE OF PROCEDURE:  08/14/2005  DATE OF DISCHARGE:                              PULMONARY FUNCTION TEST   PULMONARY FUNCTION:  1. Spirometry shows a mild ventilatory defect with evidence of airflow      obstruction.  2. Lung volumes are normal.  3. DLCO is normal.  4. Arterial blood gases were normal.   This is consistent with the clinical diagnosis of chronic obstructive  pulmonary disease.      Edward L. Juanetta Gosling, M.D.  Electronically Signed     ELH/MEDQ  D:  08/14/2005  T:  08/14/2005  Job:  [id number]   cc:   Catalina Pizza, M.D.  Fax: [phone]

## 2010-05-25 NOTE — Procedures (Signed)
   NAME:  Kurt Burke, Kurt Burke                       ACCOUNT NO.:  [id number]   MEDICAL RECORD NO.:  [id number]                   PATIENT TYPE:  EMS   LOCATION:  ED                                   [facility]   PHYSICIAN:  Edward L. Juanetta Gosling, M.D.             DATE OF BIRTH:  08-Jan-1930   DATE OF PROCEDURE:  08/26/2002  DATE OF DISCHARGE:  08/26/2002                                EKG INTERPRETATION   1754 hours on August 26, 2002.   RESULTS:  The rhythm is sinus tachycardia with a rate of about 108.  There  are Q waves inferiorly which could indicate a previous inferior myocardial  infarction and clinical correlation is suggested.  There appears to be  atrial enlargement.  There are diffuse ST and T wave changes which are  nonspecific.   IMPRESSION:  Abnormal electrocardiogram.                                               Edward L. Juanetta Gosling, M.D.    ELH/MEDQ  D:  09/06/2002  T:  09/07/2002  Job:  [id number]

## 2010-09-18 ENCOUNTER — Ambulatory Visit (INDEPENDENT_AMBULATORY_CARE_PROVIDER_SITE_OTHER): Payer: Self-pay | Admitting: Internal Medicine

## 2010-10-22 ENCOUNTER — Ambulatory Visit (INDEPENDENT_AMBULATORY_CARE_PROVIDER_SITE_OTHER): Payer: Medicare Other | Admitting: Internal Medicine

## 2010-10-22 ENCOUNTER — Encounter (INDEPENDENT_AMBULATORY_CARE_PROVIDER_SITE_OTHER): Payer: Self-pay | Admitting: Internal Medicine

## 2010-10-22 DIAGNOSIS — K219 Gastro-esophageal reflux disease without esophagitis: Secondary | ICD-10-CM

## 2010-10-22 DIAGNOSIS — K7460 Unspecified cirrhosis of liver: Secondary | ICD-10-CM

## 2010-10-22 NOTE — Patient Instructions (Signed)
No changes made in medications today. We'll request copy of blood work from Dr. Keane Police office and determine what needs to be done. Followup with Dr. Jaynie Bream regarding shoulder pain and limited mobility. Dr. Dwana Melena may need to make an appointment for you

## 2010-10-23 DIAGNOSIS — K219 Gastro-esophageal reflux disease without esophagitis: Secondary | ICD-10-CM | POA: Insufficient documentation

## 2010-10-23 DIAGNOSIS — K7460 Unspecified cirrhosis of liver: Secondary | ICD-10-CM | POA: Insufficient documentation

## 2010-10-23 NOTE — Progress Notes (Signed)
Presenting complaint; followup for chronic GERD and cirrhosis. Subjective; patient is 77 year old Afro-American male who is here for scheduled visit. He was last seen one year ago in [place]. He tells been August last year he developed DVT and pulmonary embolism and was admitted to  [facility] and [facility]. His Coumadin was stopped after one year of therapy. His only complaint is one of a right shoulder pain and limited mobility. He has seen Dr. Romeo Apple. and had intra-articular steroid injection. That was over 2 years ago. He states his heartburn is well controlled with treatment. However if he misses one dose he starts having heartburn and regurgitation within 24 hours. He denies nausea vomiting abdominal pain melena or rectal bleeding. He says he has a very good appetite. Current Outpatient Prescriptions  Medication Sig Dispense Refill  . benazepril-hydrochlorthiazide (LOTENSIN HCT) 20-12.5 MG per tablet Take 1 tablet by mouth daily.        . carbamide peroxide (DEBROX) 6.5 % otic solution Place 5 drops into both ears as needed.        Marland Kitchen dexlansoprazole (DEXILANT) 60 MG capsule Take 60 mg by mouth daily.        Marland Kitchen ENSURE (ENSURE) Take 1 Can by mouth daily.        . fluticasone (FLONASE) 50 MCG/ACT nasal spray Place 2 sprays into the nose every evening.        . furosemide (LASIX) 20 MG tablet Take 20 mg by mouth as needed.        . meclizine (ANTIVERT) 25 MG tablet Take 25 mg by mouth daily.        . metoprolol (LOPRESSOR) 50 MG tablet Take 50 mg by mouth daily.         objective BP 110/68  Pulse 74  Temp(Src) 97.3 F (36.3 C) (Oral)  Resp 12  Ht 5\' 6"  (1.676 m)  Wt 180 lb (81.647 kg)  BMI 29.05 kg/m2 He is alert and does not have asterixis. Conjunctiva is pink; sclera is nonicteric.  Oropharyngeal mucosa is normal. No neck masses or thyromegaly noted. No spider angiomata or gynecomastia noted. Abdomen is full but soft and nontender. Spleen is not palpable. Left lobe of the liver is easily palpable  firm and nontender. Shifting dullness is absent. He does not have peripheral edema. Assessment #1. Chronic GERD. He is doing well with therapy. We consider dropping dose of dexilant next year. #2. History of cirrhosis presumed to be due to prior ethanol use. He does not have stigmata of chronic liver disease. Last screening ultrasound was in June 2011 and was normal Plan He will continue PPI as before. Will request copy of his blood work from Dr. Keane Police office before ordering alpha-fetoprotein or other tests.

## 2010-11-01 ENCOUNTER — Other Ambulatory Visit (INDEPENDENT_AMBULATORY_CARE_PROVIDER_SITE_OTHER): Payer: Self-pay | Admitting: Internal Medicine

## 2010-11-01 ENCOUNTER — Telehealth (INDEPENDENT_AMBULATORY_CARE_PROVIDER_SITE_OTHER): Payer: Self-pay | Admitting: *Deleted

## 2010-11-01 DIAGNOSIS — K7460 Unspecified cirrhosis of liver: Secondary | ICD-10-CM

## 2010-11-01 NOTE — Telephone Encounter (Signed)
Per Dr. Karilyn Cota the patient will need a AFP and Hepatic Ultrasound. Diagsnosis: H/O Cirrhosis. Lab has been noted, forwarded to [facility] to address Hepatic U/S.

## 2010-11-01 NOTE — Telephone Encounter (Signed)
U/S sch'd 11/07/10, patient aware

## 2010-11-07 ENCOUNTER — Ambulatory Visit (HOSPITAL_COMMUNITY)
Admission: RE | Admit: 2010-11-07 | Discharge: 2010-11-07 | Disposition: A | Discharge status: Home / Self Care | Payer: Medicare Other | Source: Ambulatory Visit | Attending: Internal Medicine | Admitting: Internal Medicine

## 2010-11-07 DIAGNOSIS — K7460 Unspecified cirrhosis of liver: Secondary | ICD-10-CM | POA: Insufficient documentation

## 2010-11-07 DIAGNOSIS — Q619 Cystic kidney disease, unspecified: Secondary | ICD-10-CM | POA: Insufficient documentation

## 2010-11-14 ENCOUNTER — Encounter (INDEPENDENT_AMBULATORY_CARE_PROVIDER_SITE_OTHER): Payer: Self-pay | Admitting: *Deleted

## 2010-11-22 ENCOUNTER — Other Ambulatory Visit (INDEPENDENT_AMBULATORY_CARE_PROVIDER_SITE_OTHER): Payer: Self-pay | Admitting: Internal Medicine

## 2010-11-23 LAB — AFP TUMOR MARKER: AFP-Tumor Marker: 3.5 ng/mL (ref 0.0–8.0)

## 2010-11-26 ENCOUNTER — Telehealth (INDEPENDENT_AMBULATORY_CARE_PROVIDER_SITE_OTHER): Payer: Self-pay | Admitting: *Deleted

## 2010-11-26 DIAGNOSIS — K7460 Unspecified cirrhosis of liver: Secondary | ICD-10-CM

## 2010-11-26 NOTE — Telephone Encounter (Signed)
This lab is due May 2013.

## 2011-05-09 ENCOUNTER — Encounter (INDEPENDENT_AMBULATORY_CARE_PROVIDER_SITE_OTHER): Payer: Self-pay | Admitting: *Deleted

## 2011-05-13 ENCOUNTER — Other Ambulatory Visit (INDEPENDENT_AMBULATORY_CARE_PROVIDER_SITE_OTHER): Payer: Self-pay | Admitting: *Deleted

## 2011-05-13 DIAGNOSIS — K7460 Unspecified cirrhosis of liver: Secondary | ICD-10-CM

## 2011-05-22 LAB — AFP TUMOR MARKER: AFP-Tumor Marker: 2.4 ng/mL (ref 0.0–8.0)

## 2011-05-23 ENCOUNTER — Telehealth (INDEPENDENT_AMBULATORY_CARE_PROVIDER_SITE_OTHER): Payer: Self-pay | Admitting: *Deleted

## 2011-05-23 DIAGNOSIS — K7460 Unspecified cirrhosis of liver: Secondary | ICD-10-CM

## 2011-05-23 NOTE — Telephone Encounter (Signed)
Recent AFP normal and will repeat in 6 months,lab noted for November 2013

## 2011-06-12 ENCOUNTER — Telehealth (INDEPENDENT_AMBULATORY_CARE_PROVIDER_SITE_OTHER): Payer: Self-pay | Admitting: *Deleted

## 2011-06-12 MED ORDER — DEXLANSOPRAZOLE 60 MG PO CPDR: 60.0000 mg | DELAYED_RELEASE_CAPSULE | Freq: Every day | ORAL | Status: DC

## 2011-06-12 NOTE — Telephone Encounter (Signed)
[facility] has requested a refill on Dexilant 60 mg capsules, take 1 capsule by mouth once daily for acid reflux.

## 2011-09-19 ENCOUNTER — Encounter (INDEPENDENT_AMBULATORY_CARE_PROVIDER_SITE_OTHER): Payer: Self-pay | Admitting: *Deleted

## 2011-09-23 ENCOUNTER — Ambulatory Visit (INDEPENDENT_AMBULATORY_CARE_PROVIDER_SITE_OTHER): Payer: PRIVATE HEALTH INSURANCE

## 2011-09-23 ENCOUNTER — Ambulatory Visit (INDEPENDENT_AMBULATORY_CARE_PROVIDER_SITE_OTHER): Payer: PRIVATE HEALTH INSURANCE | Admitting: Orthopedic Surgery

## 2011-09-23 ENCOUNTER — Encounter: Payer: Self-pay | Admitting: Orthopedic Surgery

## 2011-09-23 VITALS — BP 110/62 | Ht 62.0 in | Wt 179.0 lb

## 2011-09-23 DIAGNOSIS — M25511 Pain in right shoulder: Secondary | ICD-10-CM

## 2011-09-23 DIAGNOSIS — M67919 Unspecified disorder of synovium and tendon, unspecified shoulder: Secondary | ICD-10-CM

## 2011-09-23 DIAGNOSIS — M25519 Pain in unspecified shoulder: Secondary | ICD-10-CM

## 2011-09-23 DIAGNOSIS — M25319 Other instability, unspecified shoulder: Secondary | ICD-10-CM | POA: Insufficient documentation

## 2011-09-23 NOTE — Progress Notes (Signed)
Patient ID: Kurt Burke, male   DOB: 1929/09/09, 78 y.o.   MRN: [id number] Chief Complaint  Patient presents with  . Shoulder Pain    right shoulder to elbow pain, gradual onset    Consult requested by Dr. Dwana Melena Reason for consultation shoulder pain, right x3 months  This is an 12+ or old male with no history of trauma a gradual onset of dull pain in his right shoulder for the last 3 months. He complains a 4/10 intermittent pain in his right shoulder associated with his activities of daily living he also complains of some numbness in the right shoulder but not below the elbow. No previous treatment. No current medications related to this problem.  His reported review of systems that are positive include fatigue redness and watering of the eyes blurred vision. Shortness of breath, cough. Heartburn. Stiffness in the joint. Weakness of the right upper extremity. Itching, dizziness, allergies the remaining systems were reviewed and were negative.  His medical history has been recorded and reviewed.  BP 110/62  Ht 5\' 2"  (1.575 m)  Wt 81.194 kg (179 lb)  BMI 32.74 kg/m2  General exam is notable for no gross deformities. He is oriented x3 his mood and affect are normal he is ambulatory with a cane in the right hand  His right shoulder has normal passive range of motion he has a positive drop test he has slight traction of the humerus related to the acromion. His external rotation was normal his internal rotation was limited by 4-5 levels in terms of vertebrae. The shoulder was otherwise stable with weakness of the supraspinatus normal strength of the infraspinatus and subscapularis. The skin overlying the right shoulder was normal  Had a normal pulse in the right upper extremity temperature was normal no edema no swelling no lymph nodes in the axilla or the supraclavicular region. He had normal sensation no pathologic reflexes and coordination was normal  His left shoulder had full range  of motion normal stability and strength skin was normal and inspection revealed no tenderness  X-rays show chronic rotator cuff disease type changes with a type III acromion  Impression rotator cuff insufficiency  Recommend subacromial injection and occupational therapy at the hospital to improve strength in the rotator cuff that remains  His glenohumeral joint looks good so it is unlikely that he would need surgery at this time. He may be a candidate for a reverse prosthesis or humeral head substituting prosthesis such as the CTA followup as needed  Shoulder Injection Procedure Note   Pre-operative Diagnosis: right  RC Syndrome  Post-operative Diagnosis: same  Indications: pain   Anesthesia: ethyl chloride   Procedure Details   Verbal consent was obtained for the procedure. The shoulder was prepped withalcohol and the skin was anesthetized. A 20 gauge needle was advanced into the subacromial space through posterior approach without difficulty  The space was then injected with 3 ml 1% lidocaine and 1 ml of depomedrol. The injection site was cleansed with isopropyl alcohol and a dressing was applied.  Complications:  None; patient tolerated the procedure well.

## 2011-09-23 NOTE — Patient Instructions (Addendum)
START PHYSICAL THERAPY  Call hospital to make appointment   You have received a steroid shot. 15% of patients experience increased pain at the injection site with in the next 24 hours. This is best treated with ice and tylenol extra strength 2 tabs every 8 hours. If you are still having pain please call the office.    Rotator Cuff Tear The rotator cuff is four tendons that assist in the motion of the shoulder. A rotator cuff tear is a tear in one of these four tendons. It is characterized by pain and weakness of the shoulder. The rotator cuff tendons surround the shoulder ball and socket joint (humeral head). The rotator cuff tendons attach to the shoulder blade (scapula) on one side and the upper arm bone (humerus) on the other side. The rotator cuff is essential for shoulder stability and shoulder motion. SYMPTOMS    Pain around the shoulder, often at the outer portion of the upper arm.   Pain that is worse with shoulder function, especially when reaching overhead or lifting.   Weakness of the shoulder muscles.   Aching when not using your arm; often, pain awakens you at night, especially when sleeping on the affected side.   Tenderness, swelling, warmth, or redness over the outer aspect of the shoulder.   Loss of strength.   Limited motion of the shoulder, especially reaching behind (reaching into one's back pocket) or across your body.   A crackling sound (crepitation) when moving the shoulder.   Biceps tendon pain (in the front of the shoulder) and inflammation, worse with bending the elbow or lifting.  CAUSES    Strain from sudden increase in amount or intensity of activity.   Direct blow or injury to the shoulder.   Aging, wear from from normal use.   Roof of the shoulder (acromial) spur.  RISK INCREASES WITH:    Contact sports (football, wrestling, or boxing).   Throwing or hitting sports (baseball, tennis, or volleyball).   Weightlifting and bodybuilding.   Heavy  labor.   Previous injury to rotator cuff.   Failure to warm up properly before activity.   Inadequate protective equipment.   Increasing age.   Spurring of the outer end of the scapula (acromion).   Cortisone injections.   Poor shoulder strength and flexibility.  PREVENTION  Warm up and stretch properly before activity.   Allow time for rest and recovery between practices and competition.   Maintain physical fitness:   Cardiovascular fitness.   Shoulder flexibility.   Strength and endurance of the rotator cuff muscles and muscles of the shoulder blade.   Learn and use proper technique when throwing or hitting.  PROGNOSIS Surgery is often needed. Although, symptoms may go away by themselves. RELATED COMPLICATIONS    Persistent pain that may progress to constant pain.   Shoulder stiffness, frozen shoulder syndrome, or loss of motion.   Recurrence of symptoms, especially if treated without surgery.   Inability to return to same level of sports, even with surgery.   Persistent weakness.   Risks of surgery, including infection, bleeding, injury to nerves, shoulder stiffness, weakness, re-tearing of the rotator cuff tendon.   Deltoid detachment, acromial fracture, and persistent pain.  TREATMENT Treatment involves the use of ice and medicine to reduce pain and inflammation. Strengthening and stretching exercise are usually recommended. These exercises may be completed at home or with a therapist. You may also be instructed to modify offending activities. Corticosteroid injections may be given to reduce  inflammation. Surgery is usually recommended for athletes. Surgery has the best chance for a full recovery. Surgery involves:  Removal of an inflamed bursa.   Removal of an acromial spur if present.   Suturing the torn tendon back together.  Rotator cuff surgeries may be preformed either arthroscopically or through an open incision. Recovery typically takes 6 to 12  months. MEDICATION  If pain medicine is necessary, then nonsteroidal anti-inflammatory medicines, such as aspirin and ibuprofen, or other minor pain relievers, such as acetaminophen, are often recommended.   Do not take pain medicine for 7 days before surgery.   Prescription pain relievers are usually only prescribed after surgery. Use only as directed and only as much as you need.   Corticosteroid injections may be given to reduce inflammation. However, there is a limited number of times the joint may be injected with these medicines.

## 2011-09-26 ENCOUNTER — Telehealth (INDEPENDENT_AMBULATORY_CARE_PROVIDER_SITE_OTHER): Payer: Self-pay | Admitting: *Deleted

## 2011-09-26 ENCOUNTER — Other Ambulatory Visit (INDEPENDENT_AMBULATORY_CARE_PROVIDER_SITE_OTHER): Payer: Self-pay | Admitting: *Deleted

## 2011-09-26 ENCOUNTER — Ambulatory Visit (INDEPENDENT_AMBULATORY_CARE_PROVIDER_SITE_OTHER): Payer: PRIVATE HEALTH INSURANCE | Admitting: Internal Medicine

## 2011-09-26 ENCOUNTER — Encounter (INDEPENDENT_AMBULATORY_CARE_PROVIDER_SITE_OTHER): Payer: Self-pay | Admitting: Internal Medicine

## 2011-09-26 VITALS — BP 104/80 | HR 52 | Temp 98.0°F | Ht 64.0 in | Wt 180.9 lb

## 2011-09-26 DIAGNOSIS — Z1211 Encounter for screening for malignant neoplasm of colon: Secondary | ICD-10-CM

## 2011-09-26 DIAGNOSIS — K625 Hemorrhage of anus and rectum: Secondary | ICD-10-CM | POA: Insufficient documentation

## 2011-09-26 DIAGNOSIS — Z86010 Personal history of colonic polyps: Secondary | ICD-10-CM

## 2011-09-26 MED ORDER — PEG-KCL-NACL-NASULF-NA ASC-C 100 G PO SOLR: 1.0000 | Freq: Once | ORAL | Status: DC

## 2011-09-26 NOTE — Patient Instructions (Addendum)
Colonoscopy with Dr. [name]. The risks and benefits such as perforation, bleeding, and infection were reviewed with the patient and is agreeable. 

## 2011-09-26 NOTE — Telephone Encounter (Signed)
Patient needs movi prep 

## 2011-09-26 NOTE — Progress Notes (Signed)
Subjective:     Patient ID: Kurt Burke, male   DOB: 15-Dec-1929, 78 y.o.   MRN: [id number]  HPI  Referred by Dr. Scharlene Gloss office for rectal bleeding.  He noticed blood in his stool about 2 weeks ago. He denies seeing any further bleeding. His stools cards were positive for blood grossly per Adrian Saran, NP notes.   He apparently has been taking ASA and Motrin. Appetite is good. NO weight loss. BMs are brown. Usually has one a day. No abdominal pain. He denies any acid reflux.    His last colonoscoy was in 2009. Hx of colonic polyps. He had a large pedunculated adenoma removed in February 2004.  Pancolonic diverticulosis, however, multiple diverticula noted at sigmoid colon. 3 mm polyp ablated. Biopsy: benign colonic polyp.   Review of Systems See hpi Current Outpatient Prescriptions  Medication Sig Dispense Refill  . albuterol (PROVENTIL HFA;VENTOLIN HFA) 108 (90 BASE) MCG/ACT inhaler Inhale 2 puffs into the lungs every 6 (six) hours as needed.      Marland Kitchen dexlansoprazole (DEXILANT) 60 MG capsule Take 1 capsule (60 mg total) by mouth daily.  30 capsule  6  . ENSURE (ENSURE) Take 1 Can by mouth daily.        . fluticasone (FLONASE) 50 MCG/ACT nasal spray Place 2 sprays into the nose every evening.        . furosemide (LASIX) 20 MG tablet Take 20 mg by mouth as needed.        . meclizine (ANTIVERT) 25 MG tablet Take 25 mg by mouth daily.        . metoprolol (LOPRESSOR) 50 MG tablet Take 50 mg by mouth daily.        . benazepril-hydrochlorthiazide (LOTENSIN HCT) 20-12.5 MG per tablet Take 1 tablet by mouth daily.        . carbamide peroxide (DEBROX) 6.5 % otic solution Place 5 drops into both ears as needed.         Past Medical History  Diagnosis Date  . GERD (gastroesophageal reflux disease)   . Hypertension    Past Surgical History  Procedure Date  . Cystoscopy 2000    Preformed to remove cyst from liver at [facility]   History   Social History  . Marital Status: Single   Spouse Name: N/A    Number of Children: N/A  . Years of Education: 12   Occupational History  . Not on file.   Social History Main Topics  . Smoking status: Former Smoker    Types: Cigarettes    Quit date: 10/22/1990  . Smokeless tobacco: Never Used  . Alcohol Use: No  . Drug Use: No  . Sexually Active: Not on file   Other Topics Concern  . Not on file   Social History Narrative  . No narrative on file   Family Status  Relation Status Death Age  . Mother Deceased 40  . Father Deceased 71  . Sister Alive     Per Patient she has several problem but not sure what they are  . Brother Deceased 91    breathing problems  . Sister Alive    .aall      Objective:   Physical Exam Filed Vitals:   09/26/11 1101  BP: 104/80  Pulse: 52  Temp: 98 F (36.7 C)  Height: 5\' 4"  (1.626 m)  Weight: 180 lb 14.4 oz (82.056 kg)        Assessment:   Rectal bleeding. Hx of  large adenoma removed in 2004. Last colonoscopy in 2009 with a benign polyp.    Plan:    Colonoscopy with Dr Karilyn Cota. The risks and benefits such as perforation, bleeding, and infection were reviewed with the patient and is agreeable.  No NSAIDs.

## 2011-09-27 ENCOUNTER — Encounter (HOSPITAL_COMMUNITY): Payer: Self-pay | Admitting: Pharmacy Technician

## 2011-10-02 ENCOUNTER — Ambulatory Visit (HOSPITAL_COMMUNITY)
Admission: RE | Admit: 2011-10-02 | Discharge: 2011-10-02 | Disposition: A | Discharge status: Home / Self Care | Payer: PRIVATE HEALTH INSURANCE | Source: Ambulatory Visit | Attending: Orthopedic Surgery | Admitting: Orthopedic Surgery

## 2011-10-02 DIAGNOSIS — IMO0001 Reserved for inherently not codable concepts without codable children: Secondary | ICD-10-CM | POA: Insufficient documentation

## 2011-10-02 DIAGNOSIS — M6281 Muscle weakness (generalized): Secondary | ICD-10-CM | POA: Insufficient documentation

## 2011-10-02 DIAGNOSIS — M25819 Other specified joint disorders, unspecified shoulder: Secondary | ICD-10-CM

## 2011-10-02 DIAGNOSIS — I10 Essential (primary) hypertension: Secondary | ICD-10-CM | POA: Insufficient documentation

## 2011-10-02 DIAGNOSIS — M25519 Pain in unspecified shoulder: Secondary | ICD-10-CM

## 2011-10-02 DIAGNOSIS — M75120 Complete rotator cuff tear or rupture of unspecified shoulder, not specified as traumatic: Secondary | ICD-10-CM

## 2011-10-02 DIAGNOSIS — M25319 Other instability, unspecified shoulder: Secondary | ICD-10-CM

## 2011-10-02 DIAGNOSIS — M25619 Stiffness of unspecified shoulder, not elsewhere classified: Secondary | ICD-10-CM | POA: Insufficient documentation

## 2011-10-02 NOTE — Evaluation (Signed)
Occupational Therapy Evaluation  Patient Details  Name: Kurt Burke MRN: [id number] Date of Birth: February 25, 1929  Today's Date: 10/02/2011 Time: 7829-5621 OT Time Calculation (min): 45 min OT Evaluation 1430-1450 20' Manual Therapy 1450-1510 20' Visit#: 1  of 12   Re-eval: 10/30/11  Assessment Diagnosis: Right Rotator Cuff Insufficiency Next MD Visit: unknown Prior Therapy: none  Authorization: [employer]  Authorization Time Period: before 10th visit  Authorization Visit#: 1  of 10    Past Medical History:  Past Medical History  Diagnosis Date  . GERD (gastroesophageal reflux disease)   . Hypertension    Past Surgical History:  Past Surgical History  Procedure Date  . Cystoscopy 2000    Preformed to remove cyst from liver at [facility]    Subjective Symptoms/Limitations Symptoms: S:  I hurt my right shoulder over 40 years ago.  Its slowly gotten worse over time.  Now, I can't swing a hammer without it hurting me.  Pertinent History: Kurt Burke has had pain in his right shoulder for at least 40 years, stemming from a work injury.  His movement became quite limited and he consulted with Dr. Romeo Apple.  He received a cortisone injection last week.  He has been referred to occupational therapy for evaluation and treatment.   Special Tests: UEFI:  39/80= 50% Patient Stated Goals: I want to be able to move better. Pain Assessment Currently in Pain?: Yes Pain Score:   3 Pain Location: Shoulder Pain Orientation: Right Pain Type: Chronic pain  Precautions/Restrictions    N/A Prior Functioning  Home Living Lives With: Alone Prior Function Level of Independence: Independent with basic ADLs;Independent with homemaking with ambulation Driving: No Vocation: On disability Leisure:  (spending time with friends)  Assessment ADL/Vision/Perception ADL ADL Comments: Kurt Burke has difficulty reaching overhead, reaching out to the side is difficult Dominant Hand:  Right Vision - History Baseline Vision: Wears glasses for distance only  Cognition/Observation Cognition Overall Cognitive Status: Appears within functional limits for tasks assessed  Sensation/Coordination/Edema Sensation Light Touch: Appears Intact Coordination Gross Motor Movements are Fluid and Coordinated: Yes Fine Motor Movements are Fluid and Coordinated: Yes  Additional Assessments RUE AROM (degrees) RUE Overall AROM Comments: Assessed in seated.  ER/IR with shoulder adducted  Right Shoulder Flexion: 80 Degrees Right Shoulder ABduction: 115 Degrees Right Shoulder Internal Rotation: 80 Degrees Right Shoulder External Rotation: 20 Degrees RUE PROM (degrees) RUE Overall PROM Comments: Assessed in supine Right Shoulder Flexion: 170 Degrees Right Shoulder ABduction: 170 Degrees Right Shoulder Internal Rotation: 90 Degrees Right Shoulder External Rotation: 90 Degrees RUE Strength Right Shoulder Flexion:  (4-/5) Right Shoulder ABduction:  (4-/5) Right Shoulder Internal Rotation:  (4-/5) Right Shoulder External Rotation:  (4-/5) Palpation Palpation: Moderate fascial restrictions in his scapular region and upper arm     Exercise/Treatments    Manual Therapy Manual Therapy: Myofascial release Myofascial Release: MFR and manual stretching to right upper arm, shoulder, and scapular region to decrease pain and restrictions and increase pain free mobility.    Occupational Therapy Assessment and Plan OT Assessment and Plan Clinical Impression Statement: A:  78 year old male presents with increased pain and restrictions and decreased AROM and strength in his right shoulder, limiting his I with his daily activities.   Pt will benefit from skilled therapeutic intervention in order to improve on the following deficits: Increased fascial restricitons;Impaired UE functional use;Pain;Increased muscle spasms;Decreased range of motion;Decreased strength Rehab Potential: Good OT  Frequency: Min 2X/week OT Duration: 6 weeks OT Treatment/Interventions: Self-care/ADL  training;Therapeutic exercise;Therapeutic activities;Manual therapy;Patient/family education;Modalities OT Plan: P:  Skilled OT intervention to decrease pain and restrictions and increase AROM and strength in his RUE needed for increased I with his daily activities.  Treatment Plan:  MFR and manual stretching in supine.  THerapeutic Exercises:  PROM and AAROM in supine progress to AROM as tolerated.  Seated ext, elev, row.  therapy ball stretches, thumb tacks, prot.ret..elev.dep.  progress as tolerated.    Goals Short Term Goals Time to Complete Short Term Goals: 3 weeks Short Term Goal 1: Patient will be educated on a HEP. Short Term Goal 2: Patient will increase AROM by 50% in his right shoulder for increased ability to reach into overhead cabinets. Short Term Goal 3: Patient will increase right shoulder strength to 4/5 for increased abiilty to lift bags of groceries. Short Term Goal 4: Patient will decrease pain in his right shoulder to 2/10 when reaching out to swat a fly. Short Term Goal 5: Patient will decrease fascial restrictions from moderate to min-mod in his right shoulder. Long Term Goals Time to Complete Long Term Goals: 6 weeks Long Term Goal 1: Patient will return to prior level of I with all B/IADLs and leisure activities. Long Term Goal 2: Patient will increase right shoulder AROM to [facility] for increased ability to put items into overhead cabinets. Long Term Goal 3: Patient will increase right shoulder strength to 4+/5 for increased ability to lift tools when working in his yard. Long Term Goal 4: Patient will decrease fascial restrictions to minimal in his right shoulder region. Long Term Goal 5: Patient will decrease his pain level to 1/10 in his right shoulder when reaching overhead.  Problem List Patient Active Problem List  Diagnosis  . SHOULDER PAIN  . IMPINGEMENT SYNDROME  . RUPTURE  ROTATOR CUFF  . GERD (gastroesophageal reflux disease)  . Cirrhosis  . Rotator cuff insufficiency  . Rectal bleeding    End of Session Activity Tolerance: Patient tolerated treatment well General Behavior During Session: [facility] for tasks performed Cognition: [facility] for tasks performed OT Plan of Care OT Home Exercise Plan: Educated on HEP for dowel rod exercises in supine.   Consulted and Agree with Plan of Care: Patient  GO Functional Assessment Tool Used: UEFI is 50% impaired with RUE use Functional Limitation: Carrying, moving and handling objects Carrying, Moving and Handling Objects Current Status (Z6109): At least 40 percent but less than 60 percent impaired, limited or restricted Carrying, Moving and Handling Objects Goal Status [id number]): At least 1 percent but less than 20 percent impaired, limited or restricted  Shirlean Mylar, OTR/L  10/02/2011, 5:15 PM  Physician Documentation Your signature is required to indicate approval of the treatment plan as stated above.  Please sign and either send electronically or make a copy of this report for your files and return this physician signed original.  Please mark one 1.__approve of plan  2. ___approve of plan with the following conditions.   ______________________________                                                          _____________________ Physician Signature  Date  

## 2011-10-07 ENCOUNTER — Ambulatory Visit (HOSPITAL_COMMUNITY)
Admission: RE | Admit: 2011-10-07 | Discharge: 2011-10-07 | Disposition: A | Discharge status: Home / Self Care | Payer: PRIVATE HEALTH INSURANCE | Source: Ambulatory Visit | Attending: Internal Medicine | Admitting: Internal Medicine

## 2011-10-07 ENCOUNTER — Encounter (HOSPITAL_COMMUNITY)
Admission: RE | Disposition: A | Discharge status: Home / Self Care | Payer: Self-pay | Source: Ambulatory Visit | Attending: Internal Medicine

## 2011-10-07 ENCOUNTER — Encounter (HOSPITAL_COMMUNITY): Payer: Self-pay | Admitting: *Deleted

## 2011-10-07 DIAGNOSIS — K921 Melena: Secondary | ICD-10-CM

## 2011-10-07 DIAGNOSIS — K5730 Diverticulosis of large intestine without perforation or abscess without bleeding: Secondary | ICD-10-CM

## 2011-10-07 DIAGNOSIS — Z860100 Personal history of colon polyps, unspecified: Secondary | ICD-10-CM | POA: Insufficient documentation

## 2011-10-07 DIAGNOSIS — D126 Benign neoplasm of colon, unspecified: Secondary | ICD-10-CM

## 2011-10-07 DIAGNOSIS — K644 Residual hemorrhoidal skin tags: Secondary | ICD-10-CM | POA: Insufficient documentation

## 2011-10-07 DIAGNOSIS — K625 Hemorrhage of anus and rectum: Secondary | ICD-10-CM

## 2011-10-07 DIAGNOSIS — I10 Essential (primary) hypertension: Secondary | ICD-10-CM | POA: Insufficient documentation

## 2011-10-07 HISTORY — PX: COLONOSCOPY: SHX5424

## 2011-10-07 HISTORY — DX: Unspecified hearing loss, unspecified ear: H91.90

## 2011-10-07 HISTORY — DX: Unspecified osteoarthritis, unspecified site: M19.90

## 2011-10-07 HISTORY — DX: Shortness of breath: R06.02

## 2011-10-07 LAB — CBC
MCV: 92.7 fL (ref 78.0–100.0)
Platelets: 259 10*3/uL (ref 150–400)
RBC: 4.51 MIL/uL (ref 4.22–5.81)
WBC: 4.3 10*3/uL (ref 4.0–10.5)

## 2011-10-07 SURGERY — COLONOSCOPY
Anesthesia: Moderate Sedation

## 2011-10-07 MED ORDER — MIDAZOLAM HCL 5 MG/5ML IJ SOLN
INTRAMUSCULAR | Status: DC | PRN
  Administered 2011-10-07: 2 mg via INTRAVENOUS

## 2011-10-07 MED ORDER — MEPERIDINE HCL 50 MG/ML IJ SOLN
INTRAMUSCULAR | Status: DC | PRN
  Administered 2011-10-07: 25 mg via INTRAVENOUS

## 2011-10-07 MED ORDER — MEPERIDINE HCL 50 MG/ML IJ SOLN
INTRAMUSCULAR | Status: AC
  Filled 2011-10-07: qty 1

## 2011-10-07 MED ORDER — PSYLLIUM 28 % PO PACK: 1.0000 | PACK | Freq: Every day | ORAL | Status: DC

## 2011-10-07 MED ORDER — MIDAZOLAM HCL 5 MG/5ML IJ SOLN
INTRAMUSCULAR | Status: AC
  Filled 2011-10-07: qty 5

## 2011-10-07 MED ORDER — SODIUM CHLORIDE 0.45 % IV SOLN
INTRAVENOUS | Status: DC
  Administered 2011-10-07: 1000 mL via INTRAVENOUS

## 2011-10-07 MED ORDER — STERILE WATER FOR IRRIGATION IR SOLN
Status: DC | PRN
  Administered 2011-10-07: 08:00:00

## 2011-10-07 NOTE — Op Note (Signed)
COLONOSCOPY PROCEDURE REPORT  PATIENT:  Kurt Burke  MR#:  [id number] Birthdate:  25-Aug-1929, 78 y.o., male Endoscopist:  Dr. Malissa Hippo, MD Referred By:  Dr. Catalina Pizza, MD Procedure Date: 10/07/2011  Procedure:   Colonoscopy  Indications:  Patient is 78 year old African male with history of colonic adenomas who experienced an episode or rectal bleeding 2 weeks ago. He subsequently had Hemoccult and they're also positive. He is undergoing diagnostic examination.  Informed Consent:  The procedure and risks were reviewed with the patient and informed consent was obtained.  Medications:  Demerol 25 mg IV Versed 2 mg IV  Description of procedure:  After a digital rectal exam was performed, that colonoscope was advanced from the anus through the rectum and colon to the area of the cecum, ileocecal valve and appendiceal orifice. The cecum was deeply intubated. These structures were well-seen and photographed for the record. From the level of the cecum and ileocecal valve, the scope was slowly and cautiously withdrawn. The mucosal surfaces were carefully surveyed utilizing scope tip to flexion to facilitate fold flattening as needed. The scope was pulled down into the rectum where a thorough exam including retroflexion was performed.  Findings:   Prep satisfactory. Multiple diverticula throughout the colon but most of these were at sigmoid colon. 4 mm polyp ablated via cold biopsy from cecum. Normal rectal mucosa. Small hemorrhoids below the dentate line.  Therapeutic/Diagnostic Maneuvers Performed:  See above  Complications:  None  Cecal Withdrawal Time:  10  minutes  Impression:  Examination performed to cecum. Pancolonic diverticulosis. Small cecal polyp ablated via cold biopsy. Small external hemorrhoids. Suspect she may have bled from colonic diverticula.  Recommendations:  Standard instructions given. Will check CBC today. High fiber diet and fiber supplement 3-4 g  daily. I will contact patient with results of blood work and biopsy.  [name],[name]  10/07/2011 8:07 AM  CC: Dr. Dwana Melena, MD & Dr. Bonnetta Barry ref. provider found

## 2011-10-07 NOTE — H&P (Signed)
Kurt Burke is an 78 y.o. male.   Chief Complaint: Patient is here for colonoscopy. HPI: Patient is 78 year old African male who experienced an episode of rectal bleeding few weeks ago. He was seen by his primary care physician Dr. Catalina Pizza had Hemoccult and follow for strongly positive. He has not experienced any more episodes of bleeding. He denies abdominal pain diarrhea or constipation. He says his heartburn is well controlled but he has productive cough with clear sputum. Patient has history of colonic adenomas. Pedunculated adenoma removed in 2004. His last colonoscopy was 5 years ago with movement of small polyp. He may have been taking NSAIDs but he denies taking any since then.  Past Medical History  Diagnosis Date  . GERD (gastroesophageal reflux disease)   . Hypertension   . HOH (hard of hearing)   . Shortness of breath     exertion  . Arthritis     Past Surgical History  Procedure Date  . Cystoscopy 2000    Preformed to remove cyst [facility] liver at [facility]  . Eye surgery     bilat [facility]    Family History  Problem Relation Age of Onset  . Heart disease Mother   . Heart disease Father   . Healthy Sister   . Arthritis     Social History:  reports that he quit smoking about 20 years ago. His smoking use included Cigarettes. He has never used smokeless tobacco. He reports that he does not drink alcohol or use illicit drugs.  Allergies:  Allergies  Allergen Reactions  . Acetaminophen   . Ibuprofen     MD took patient off as he had a blood clot in his leg    Medications Prior to Admission  Medication Sig Dispense Refill  . albuterol (PROVENTIL HFA;VENTOLIN HFA) 108 (90 BASE) MCG/ACT inhaler Inhale 2 puffs into the lungs every 6 (six) hours as needed.      . benazepril-hydrochlorthiazide (LOTENSIN HCT) 20-12.5 MG per tablet Take 1 tablet by mouth daily.        . carbamide peroxide (DEBROX) 6.5 % otic solution Place 5 drops into both ears as needed.        Marland Kitchen  dexlansoprazole (DEXILANT) 60 MG capsule Take 1 capsule (60 mg total) by mouth daily.  30 capsule  6  . ENSURE (ENSURE) Take 1 Can by mouth daily.        . fluticasone (FLONASE) 50 MCG/ACT nasal spray Place 2 sprays into the nose every evening.        . furosemide (LASIX) 20 MG tablet Take 20 mg by mouth daily.       . meclizine (ANTIVERT) 25 MG tablet Take 25 mg by mouth daily.        . metoprolol succinate (TOPROL-XL) 50 MG 24 hr tablet Take 50 mg by mouth daily.      . peg 3350 powder (MOVIPREP) 100 G SOLR Take 1 kit (100 g total) by mouth once.  1 kit  0  . SPIRIVA HANDIHALER 18 MCG inhalation capsule Place 18 mcg into inhaler and inhale daily.      Marland Kitchen ZETIA 10 MG tablet Take 10 mg by mouth daily.        No results found for this or any previous visit (from the past 48 hour(s)). No results found.  ROS  Blood pressure 142/101, temperature 97.7 F (36.5 C), temperature source Oral, resp. rate 16, height 5\' 4"  (1.626 m), weight 180 lb (81.647 kg), SpO2  92.00%. Physical Exam  Constitutional: He appears well-developed and well-nourished.  HENT:  Mouth/Throat: Oropharynx is clear and moist.  Eyes: Conjunctivae normal are normal. No scleral icterus.  Neck: No thyromegaly present.  Cardiovascular: Normal rate, regular rhythm and normal heart sounds.   No murmur heard. Respiratory: Effort normal.  GI: He exhibits no distension and no mass. There is no tenderness.  Musculoskeletal: He exhibits no edema.  Lymphadenopathy:    He has no cervical adenopathy.  Neurological: He is alert.  Skin: Skin is warm and dry.     Assessment/Plan Rectal bleeding. History of colonic adenomas. Diagnostic colonoscopy.  Shereese Bonnie U 10/07/2011, 7:39 AM

## 2011-10-08 ENCOUNTER — Ambulatory Visit (HOSPITAL_COMMUNITY)
Admission: RE | Admit: 2011-10-08 | Discharge: 2011-10-08 | Disposition: A | Discharge status: Home / Self Care | Payer: PRIVATE HEALTH INSURANCE | Source: Ambulatory Visit | Attending: Orthopedic Surgery | Admitting: Orthopedic Surgery

## 2011-10-08 DIAGNOSIS — IMO0001 Reserved for inherently not codable concepts without codable children: Secondary | ICD-10-CM | POA: Insufficient documentation

## 2011-10-08 DIAGNOSIS — M25519 Pain in unspecified shoulder: Secondary | ICD-10-CM | POA: Insufficient documentation

## 2011-10-08 DIAGNOSIS — I10 Essential (primary) hypertension: Secondary | ICD-10-CM | POA: Insufficient documentation

## 2011-10-08 DIAGNOSIS — M25619 Stiffness of unspecified shoulder, not elsewhere classified: Secondary | ICD-10-CM | POA: Insufficient documentation

## 2011-10-08 DIAGNOSIS — M6281 Muscle weakness (generalized): Secondary | ICD-10-CM | POA: Insufficient documentation

## 2011-10-08 NOTE — Progress Notes (Signed)
Occupational Therapy Treatment Patient Details  Name: Kurt Burke MRN: [id number] Date of Birth: 10-09-1929  Today's Date: 10/08/2011 Time: 4098-1191 OT Time Calculation (min): 46 min Manual Therapy: 221-243 22' Therapeutic Exercise: 243-307 24'  Visit#: 2  of 12   Re-eval: 10/30/11    Authorization: [employer]  Authorization Time Period: before 10th visit  Authorization Visit#: 2  of 10   Subjective Symptoms/Limitations Symptoms: S: Im not in any pain and doing fine.  Pain Assessment Currently in Pain?: No/denies Pain Score: 0-No pain  Precautions/Restrictions   N/A  Exercise/Treatments Supine Protraction: PROM;AAROM;10 reps Horizontal ABduction: PROM;AAROM;10 reps External Rotation: PROM;AAROM;10 reps Internal Rotation: PROM;AAROM;10 reps Flexion: PROM;AAROM;10 reps ABduction: PROM;AAROM;10 reps Seated Elevation: AROM;10 reps Extension: AROM;10 reps Retraction: AROM;10 reps Retraction Limitations:  (Pt require max tactile input to complete)    Therapy Ball Flexion: 20 reps ABduction: 20 reps ROM / Strengthening / Isometric Strengthening Thumb Tacks: 1' (pt did not complete in proper positioning due to fatigue)          Manual Therapy Manual Therapy: Myofascial release Myofascial Release: MFR and manual stretching to right upper arm, shoulder, and scapular region to decrease pain and restrictions and increase pain free mobility  Occupational Therapy Assessment and Plan OT Assessment and Plan Clinical Impression Statement: A: Pt stated he was not in any pain. He had complete PROM in supine. Pt completed AAROM exercises in supine requiring max assist with HAB for proper positioining. Pt presented with weak muscle tone  and trembled with fatigue through out. Pt tolerated therapy ball stretches well. Pt completed thumb tacks; however  his affected arm  did fatigue quickly and his positionging slipped to waist height .  Rehab Potential: Good OT Plan: P:  Increase time on thumb tacks with correct positioing. Increase reps  in AARPOM in supine to strengthen scapula muscles. Progress as tolerated.     Goals Short Term Goals Time to Complete Short Term Goals: 3 weeks Short Term Goal 1: Patient will be educated on a HEP. Short Term Goal 2: Patient will increase AROM by 50% in his right shoulder for increased ability to reach into overhead cabinets. Short Term Goal 3: Patient will increase right shoulder strength to 4/5 for increased abiilty to lift bags of groceries. Short Term Goal 4: Patient will decrease pain in his right shoulder to 2/10 when reaching out to swat a fly. Short Term Goal 5: Patient will decrease fascial restrictions from moderate to min-mod in his right shoulder. Long Term Goals Time to Complete Long Term Goals: 6 weeks Long Term Goal 1: Patient will return to prior level of I with all B/IADLs and leisure activities. Long Term Goal 2: Patient will increase right shoulder AROM to [facility] for increased ability to put items into overhead cabinets. Long Term Goal 3: Patient will increase right shoulder strength to 4+/5 for increased ability to lift tools when working in his yard. Long Term Goal 4: Patient will decrease fascial restrictions to minimal in his right shoulder region. Long Term Goal 5: Patient will decrease his pain level to 1/10 in his right shoulder when reaching overhead.  Problem List Patient Active Problem List  Diagnosis  . SHOULDER PAIN  . IMPINGEMENT SYNDROME  . RUPTURE ROTATOR CUFF  . GERD (gastroesophageal reflux disease)  . Cirrhosis  . Rotator cuff insufficiency  . Rectal bleeding    End of Session Activity Tolerance: Patient tolerated treatment well General Behavior During Session: [facility] for tasks performed Cognition: [facility] for tasks performed  GO    Mckyla Deckman L 10/08/2011, 5:07 PM

## 2011-10-11 ENCOUNTER — Ambulatory Visit (HOSPITAL_COMMUNITY)
Admission: RE | Admit: 2011-10-11 | Discharge: 2011-10-11 | Disposition: A | Discharge status: Home / Self Care | Payer: PRIVATE HEALTH INSURANCE | Source: Ambulatory Visit | Attending: Occupational Therapy | Admitting: Occupational Therapy

## 2011-10-11 ENCOUNTER — Encounter (HOSPITAL_COMMUNITY): Payer: Self-pay | Admitting: Internal Medicine

## 2011-10-11 NOTE — Progress Notes (Signed)
Occupational Therapy Treatment Patient Details  Name: Kurt Burke MRN: [id number] Date of Birth: 11-Oct-1929  Today's Date: 10/11/2011 Time: 8469-6295 OT Time Calculation (min): 35 min Manual Therapy 215-230 15' Therapeutic Exercise 231-250 19'  Visit#: 3  of 12   Re-eval: 10/30/11 Assessment Diagnosis: Right Rotator Cuff Insufficiency  Authorization: [employer]  Authorization Time Period: before 10th visit   Authorization Visit#: 3  of 10   Subjective Symptoms/Limitations Symptoms: S;  So far no pain.  Precautions/Restrictions     Exercise/Treatments Supine Protraction: PROM;AAROM;12 reps Horizontal ABduction: PROM;AAROM;12 reps External Rotation: PROM;AAROM;12 reps Internal Rotation: PROM;AAROM;12 reps Flexion: PROM;AAROM;12 reps ABduction: PROM;AAROM;12 reps Seated Elevation: AROM;10 reps Extension: AROM;10 reps Retraction: AROM;10 reps Standing External Rotation: Theraband;10 reps Theraband Level (Shoulder External Rotation): Level 2 (Red) Internal Rotation: Theraband;10 reps Theraband Level (Shoulder Internal Rotation): Level 2 (Red) Extension: Theraband;10 reps Theraband Level (Shoulder Extension): Level 2 (Red) Row: Theraband;10 reps Therapy Ball Flexion: 20 reps ABduction: 20 reps ROM / Strengthening / Isometric Strengthening Thumb Tacks: 1' (with assist to hold at 90 degrees secondary to fatigue.) Prot/Ret//Elev/Dep: 1     Manual Therapy Manual Therapy: Myofascial release Myofascial Release: MFR and manual stretching to right upper arm, shoulder, and scapular region to decrease pain and restrictions and increase pain free mobility   Occupational Therapy Assessment and Plan OT Assessment and Plan Clinical Impression Statement: A:  Patient had full PROM supine.  Mod cues to prevent compensation with wall exercises secondary to fatigue.  Added pro/ret/elev/dep and tband for scapular strengthening. OT Plan: P:  Increase reps with AAROM and tband    Goals Short Term Goals Time to Complete Short Term Goals: 3 weeks Short Term Goal 1: Patient will be educated on a HEP. Short Term Goal 2: Patient will increase AROM by 50% in his right shoulder for increased ability to reach into overhead cabinets. Short Term Goal 3: Patient will increase right shoulder strength to 4/5 for increased abiilty to lift bags of groceries. Short Term Goal 4: Patient will decrease pain in his right shoulder to 2/10 when reaching out to swat a fly. Short Term Goal 5: Patient will decrease fascial restrictions from moderate to min-mod in his right shoulder. Long Term Goals Time to Complete Long Term Goals: 6 weeks Long Term Goal 1: Patient will return to prior level of I with all B/IADLs and leisure activities. Long Term Goal 2: Patient will increase right shoulder AROM to [facility] for increased ability to put items into overhead cabinets. Long Term Goal 3: Patient will increase right shoulder strength to 4+/5 for increased ability to lift tools when working in his yard. Long Term Goal 4: Patient will decrease fascial restrictions to minimal in his right shoulder region. Long Term Goal 5: Patient will decrease his pain level to 1/10 in his right shoulder when reaching overhead.  Problem List Patient Active Problem List  Diagnosis  . SHOULDER PAIN  . IMPINGEMENT SYNDROME  . RUPTURE ROTATOR CUFF  . GERD (gastroesophageal reflux disease)  . Cirrhosis  . Rotator cuff insufficiency  . Rectal bleeding    End of Session Activity Tolerance: Patient tolerated treatment well General Behavior During Session: [facility] for tasks performed Cognition: [facility] for tasks performed  GO   Jaylianna Tatlock L. Kairee Kozma, COTA/L  10/11/2011, 4:09 PM

## 2011-10-15 ENCOUNTER — Ambulatory Visit (HOSPITAL_COMMUNITY)
Admission: RE | Admit: 2011-10-15 | Discharge: 2011-10-15 | Disposition: A | Discharge status: Home / Self Care | Payer: PRIVATE HEALTH INSURANCE | Source: Ambulatory Visit | Attending: Orthopedic Surgery | Admitting: Orthopedic Surgery

## 2011-10-15 ENCOUNTER — Encounter (INDEPENDENT_AMBULATORY_CARE_PROVIDER_SITE_OTHER): Payer: Self-pay | Admitting: *Deleted

## 2011-10-15 NOTE — Progress Notes (Signed)
Occupational Therapy Treatment Patient Details  Name: Kurt Burke MRN: [id number] Date of Birth: 03/13/1929  Today's Date: 10/15/2011 Time: 4098-1191 OT Time Calculation (min): 38 min Manual Therapy 225-235 10' Therapeutic Exercise 236-303 27'  Visit#: 4  of 12   Re-eval: 10/30/11 Assessment Diagnosis: Right Rotator Cuff Insufficiency  Authorization: [employer]  Authorization Time Period: before 10th visit   Authorization Visit#: 4  of 10   Subjective Symptoms/Limitations Symptoms: S: My arm feels pretty good.  Precautions/Restrictions     Exercise/Treatments Supine Protraction: PROM;AROM;10 reps Horizontal ABduction: PROM;AROM;10 reps External Rotation: PROM;AROM;10 reps Internal Rotation: PROM;AROM;10 reps Flexion: PROM;AAROM;10 reps Flexion Limitations: AAROM from therapist ABduction: PROM;AAROM;10 reps ABduction Limitations: AAROM from therapist Seated Elevation: AROM;15 reps Extension: AROM;15 reps Retraction: AROM;15 reps Therapy Ball Flexion: 20 reps ABduction: 20 reps ROM / Strengthening / Isometric Strengthening UBE (Upper Arm Bike): 3' forward 3' backwards 1.0 Thumb Tacks: 1' (with assist to keep shoulder depressed and hand at wall) Prot/Ret//Elev/Dep: 1     Manual Therapy Manual Therapy: Myofascial release Myofascial Release: MFR and manual stretching to right upper arm, shoulder, and scapular region to decrease pain and restrictions and increase pain free mobility   Occupational Therapy Assessment and Plan OT Assessment and Plan Clinical Impression Statement: A:  Patient with full PROM and no pain will change MFR to PRN and focus on strengthening.  Added UBE and AROM supine.  Attempted AROM for flexion and ABD but pt unable to complete full AROM without assist.  Also needed assist to keep shoulder depressed and arm at 90 degrees with thumbtacks.. Rehab Potential: Good OT Plan: P:  Add rythmic stab supine.   Goals Short Term Goals Time to  Complete Short Term Goals: 3 weeks Short Term Goal 1: Patient will be educated on a HEP. Short Term Goal 2: Patient will increase AROM by 50% in his right shoulder for increased ability to reach into overhead cabinets. Short Term Goal 3: Patient will increase right shoulder strength to 4/5 for increased abiilty to lift bags of groceries. Short Term Goal 4: Patient will decrease pain in his right shoulder to 2/10 when reaching out to swat a fly. Short Term Goal 5: Patient will decrease fascial restrictions from moderate to min-mod in his right shoulder. Long Term Goals Time to Complete Long Term Goals: 6 weeks Long Term Goal 1: Patient will return to prior level of I with all B/IADLs and leisure activities. Long Term Goal 2: Patient will increase right shoulder AROM to [facility] for increased ability to put items into overhead cabinets. Long Term Goal 3: Patient will increase right shoulder strength to 4+/5 for increased ability to lift tools when working in his yard. Long Term Goal 4: Patient will decrease fascial restrictions to minimal in his right shoulder region. Long Term Goal 5: Patient will decrease his pain level to 1/10 in his right shoulder when reaching overhead.  Problem List Patient Active Problem List  Diagnosis  . SHOULDER PAIN  . IMPINGEMENT SYNDROME  . RUPTURE ROTATOR CUFF  . GERD (gastroesophageal reflux disease)  . Cirrhosis  . Rotator cuff insufficiency  . Rectal bleeding    End of Session Activity Tolerance: Patient tolerated treatment well General Behavior During Session: [facility] for tasks performed Cognition: [facility] for tasks performed  GO    Hannahgrace Lalli L. Stellan Vick, COTA/L  10/15/2011, 3:14 PM

## 2011-10-17 ENCOUNTER — Ambulatory Visit (HOSPITAL_COMMUNITY)
Admission: RE | Admit: 2011-10-17 | Discharge: 2011-10-17 | Disposition: A | Discharge status: Home / Self Care | Payer: PRIVATE HEALTH INSURANCE | Source: Ambulatory Visit | Attending: Orthopedic Surgery | Admitting: Orthopedic Surgery

## 2011-10-17 NOTE — Progress Notes (Signed)
Occupational Therapy Treatment Patient Details  Name: Kurt Burke MRN: [id number] Date of Birth: October 08, 1929  Today's Date: 10/17/2011 Time: 7829-5621 OT Time Calculation (min): 41 min MFR 3086-5784 7' therex 6962-9528 34'  Visit#: 5  of 12   Re-eval: 10/30/11    Authorization: [employer]  Authorization Time Period: before 10th visit   Authorization Visit#: 5  of 10   Subjective Symptoms/Limitations Symptoms: S: I'm in no pain; just my billfold.  Pain Assessment Currently in Pain?: No/denies   Exercise/Treatments Supine Protraction: PROM;AROM;10 reps Protraction Limitations: AAROM from therapist Horizontal ABduction: PROM;AROM;10 reps Horizontal ABduction Limitations: AAROM from therapist External Rotation: PROM;AROM;10 reps External Rotation Limitations: AAROM from therapist Internal Rotation: PROM;AROM;10 reps Internal Rotation Limitations: AAROM from therapist Flexion: PROM;AAROM;10 reps Flexion Limitations: AAROM from therapist ABduction: PROM;AAROM;10 reps ABduction Limitations: AAROM from therapist Other Supine Exercises: rythmic stab 3x12 (assist for proper form and technique) Seated Elevation: AROM;15 reps Extension: AROM;15 reps Retraction: AROM;15 reps Retraction Limitations: max tactile input to complete Standing External Rotation: Theraband;10 reps Theraband Level (Shoulder External Rotation): Level 2 (Red) Internal Rotation: Theraband;10 reps Theraband Level (Shoulder Internal Rotation): Level 2 (Red) Extension: Theraband;10 reps Theraband Level (Shoulder Extension): Level 2 (Red) Row: Theraband;10 reps Theraband Level (Shoulder Row): Level 2 (Red) Retraction: Theraband;10 reps Therapy Ball Flexion: 20 reps ABduction: 20 reps ROM / Strengthening / Isometric Strengthening UBE (Upper Arm Bike): 3' forward 3' backwards 1.0 Thumb Tacks: 1' (physical assist at 46 sec mark due to fatigue)       Manual Therapy Myofascial Release: MFR and manual  stretching to right upper arm, shoulder, and scapular region to decrease pain and restrictions and increase pain free mobility  Occupational Therapy Assessment and Plan OT Assessment and Plan Clinical Impression Statement: A: Patient needed manual assist for AAROM supine, thumbtacks, rythmic stab and shoulder exercises with therapy ball. Patient reports no pain. OT Plan: P: cont. rythmic stab supine and proper position during exercises.   Goals Short Term Goals Time to Complete Short Term Goals: 3 weeks Short Term Goal 1: Patient will be educated on a HEP. Short Term Goal 1 Progress: Progressing toward goal Short Term Goal 2: Patient will increase AROM by 50% in his right shoulder for increased ability to reach into overhead cabinets. Short Term Goal 2 Progress: Progressing toward goal Short Term Goal 3: Patient will increase right shoulder strength to 4/5 for increased abiilty to lift bags of groceries. Short Term Goal 3 Progress: Progressing toward goal Short Term Goal 4: Patient will decrease pain in his right shoulder to 2/10 when reaching out to swat a fly. Short Term Goal 4 Progress: Met Short Term Goal 5: Patient will decrease fascial restrictions from moderate to min-mod in his right shoulder. Short Term Goal 5 Progress: Progressing toward goal Long Term Goals Time to Complete Long Term Goals: 6 weeks Long Term Goal 1: Patient will return to prior level of I with all B/IADLs and leisure activities. Long Term Goal 1 Progress: Progressing toward goal Long Term Goal 2: Patient will increase right shoulder AROM to [facility] for increased ability to put items into overhead cabinets. Long Term Goal 2 Progress: Progressing toward goal Long Term Goal 3: Patient will increase right shoulder strength to 4+/5 for increased ability to lift tools when working in his yard. Long Term Goal 3 Progress: Progressing toward goal Long Term Goal 4: Patient will decrease fascial restrictions to minimal in his  right shoulder region. Long Term Goal 4 Progress: Progressing toward goal Long Term  Goal 5: Patient will decrease his pain level to 1/10 in his right shoulder when reaching overhead. Long Term Goal 5 Progress: Progressing toward goal  Problem List Patient Active Problem List  Diagnosis  . SHOULDER PAIN  . IMPINGEMENT SYNDROME  . RUPTURE ROTATOR CUFF  . GERD (gastroesophageal reflux disease)  . Cirrhosis  . Rotator cuff insufficiency  . Rectal bleeding    End of Session Activity Tolerance: Patient tolerated treatment well General Behavior During Session: [facility] for tasks performed Cognition: [facility] for tasks performed   Noralee Stain, Xavia Kniskern L 10/17/2011, 3:15 PM

## 2011-10-22 ENCOUNTER — Ambulatory Visit (HOSPITAL_COMMUNITY)
Admission: RE | Admit: 2011-10-22 | Discharge: 2011-10-22 | Disposition: A | Discharge status: Home / Self Care | Payer: PRIVATE HEALTH INSURANCE | Source: Ambulatory Visit | Attending: Orthopedic Surgery | Admitting: Orthopedic Surgery

## 2011-10-22 NOTE — Progress Notes (Signed)
Occupational Therapy Treatment Patient Details  Name: Kurt Burke MRN: [id number] Date of Birth: January 30, 1929  Today's Date: 10/22/2011 Time: 4098-1191 OT Time Calculation (min): 45 min Massage 1440-1450 10' therex 4782-9562 35' Visit#: 6  of 12   Re-eval: 10/30/11    Authorization: [employer]   Authorization Time Period: before 10th visit   Authorization Visit#: 6  of 10   Subjective Symptoms/Limitations Symptoms: S: I feel like I'm 78 years old today. Pain Assessment Currently in Pain?: No/denies   Exercise/Treatments Supine Protraction: PROM;AROM;10 reps Protraction Limitations: AAROM from therapist Horizontal ABduction: PROM;AROM;10 reps Horizontal ABduction Limitations: AAROM from therapist External Rotation: PROM;AROM;10 reps Internal Rotation: PROM;AROM;10 reps Flexion: PROM;AAROM;10 reps ABduction: PROM;AAROM;10 reps ABduction Limitations: AAROM from therapist Other Supine Exercises: rythmic stab 3x12 (assist for form and technique) Seated Elevation: AROM;15 reps Extension: AROM;15 reps Retraction: AROM;15 reps Standing External Rotation: Theraband;10 reps Theraband Level (Shoulder External Rotation): Level 2 (Red) Internal Rotation: Theraband;10 reps Theraband Level (Shoulder Internal Rotation): Level 2 (Red) Extension: Theraband;10 reps Theraband Level (Shoulder Extension): Level 2 (Red) Row: Theraband;10 reps Theraband Level (Shoulder Row): Level 2 (Red) Row Limitations: assist from therapist Retraction: Theraband;10 reps Therapy Ball Flexion: 20 reps ABduction: 20 reps ROM / Strengthening / Isometric Strengthening UBE (Upper Arm Bike): 3' forward 3' backwards 1.0 Thumb Tacks: 1' Prot/Ret//Elev/Dep: 1'              Manual Therapy Manual Therapy: Massage Massage: Massage and stretching to right upper arm, shoulder and scapular region to decrease restrictions and increase pain free mobility. 1308-6578  Occupational Therapy Assessment  and Plan OT Assessment and Plan Clinical Impression Statement: A: Patient did not require manual assist for AAROM supine; only to start exercises initially for technique. Manual assist needed for thumbtacks and rythmic stab exercises. No report of pain. OT Plan: P: Try supine AROM exercises in seated position.   Goals Short Term Goals Time to Complete Short Term Goals: 3 weeks Short Term Goal 1: Patient will be educated on a HEP. Short Term Goal 2: Patient will increase AROM by 50% in his right shoulder for increased ability to reach into overhead cabinets. Short Term Goal 3: Patient will increase right shoulder strength to 4/5 for increased abiilty to lift bags of groceries. Short Term Goal 4: Patient will decrease pain in his right shoulder to 2/10 when reaching out to swat a fly. Short Term Goal 5: Patient will decrease fascial restrictions from moderate to min-mod in his right shoulder. Long Term Goals Time to Complete Long Term Goals: 6 weeks Long Term Goal 1: Patient will return to prior level of I with all B/IADLs and leisure activities. Long Term Goal 2: Patient will increase right shoulder AROM to [facility] for increased ability to put items into overhead cabinets. Long Term Goal 3: Patient will increase right shoulder strength to 4+/5 for increased ability to lift tools when working in his yard. Long Term Goal 4: Patient will decrease fascial restrictions to minimal in his right shoulder region. Long Term Goal 5: Patient will decrease his pain level to 1/10 in his right shoulder when reaching overhead.  Problem List Patient Active Problem List  Diagnosis  . SHOULDER PAIN  . IMPINGEMENT SYNDROME  . RUPTURE ROTATOR CUFF  . GERD (gastroesophageal reflux disease)  . Cirrhosis  . Rotator cuff insufficiency  . Rectal bleeding    End of Session Activity Tolerance: Patient tolerated treatment well General Behavior During Session: [facility] for tasks performed Cognition: [facility] for tasks  performed  Limmie Patricia, OTR/L 10/22/2011, 3:36 PM

## 2011-10-24 ENCOUNTER — Ambulatory Visit (HOSPITAL_COMMUNITY)
Admission: RE | Admit: 2011-10-24 | Discharge: 2011-10-24 | Disposition: A | Discharge status: Home / Self Care | Payer: PRIVATE HEALTH INSURANCE | Source: Ambulatory Visit | Attending: Orthopedic Surgery | Admitting: Orthopedic Surgery

## 2011-10-24 NOTE — Progress Notes (Signed)
Occupational Therapy Treatment Patient Details  Name: Kurt Burke MRN: [id number] Date of Birth: [birth year]  Today's Date: 10/24/2011 Time: 1478-2956 OT Time Calculation (min): 37 min Reassessment 37' Visit#: 7  of 12   Re-eval: 10/24/11    Authorization: [employer]  Authorization Time Period: before 10th visit  Authorization Visit#: 7  of 10   Subjective  S:    I think today can be my last day.  Special Tests: UEFI was 50% and is currently 90% Pain Assessment Currently in Pain?: No/denies Pain Score: 0-No pain  Precautions/Restrictions     Exercise/Treatments Supine Protraction: PROM;5 reps Horizontal ABduction: PROM;5 reps External Rotation: PROM;5 reps Internal Rotation: PROM;5 reps Flexion: PROM;5 reps ABduction: PROM;5 reps    Manual Therapy Manual Therapy: Myofascial release Myofascial Release: MFR to RUE x 5'  Occupational Therapy Assessment and Plan OT Assessment and Plan Clinical Impression Statement: A: Please see dc summary.  Patient has met all goals except those pertaining to strength.  He is satisfied with his progress in occupational therapy and would like to be discharged to a HEP. OT Plan: P:  DC from skilled OT services this date.   Goals Short Term Goals Time to Complete Short Term Goals: 3 weeks Short Term Goal 1: Patient will be educated on a HEP. Short Term Goal 1 Progress: Met Short Term Goal 2: Patient will increase AROM by 50% in his right shoulder for increased ability to reach into overhead cabinets. Short Term Goal 2 Progress: Met Short Term Goal 3: Patient will increase right shoulder strength to 4/5 for increased abiilty to lift bags of groceries. Short Term Goal 3 Progress: Progressing toward goal Short Term Goal 4: Patient will decrease pain in his right shoulder to 2/10 when reaching out to swat a fly. Short Term Goal 4 Progress: Met Short Term Goal 5: Patient will decrease fascial restrictions from moderate to min-mod in his  right shoulder. Short Term Goal 5 Progress: Met Long Term Goals Time to Complete Long Term Goals: 6 weeks Long Term Goal 1: Patient will return to prior level of I with all B/IADLs and leisure activities. Long Term Goal 1 Progress: Met Long Term Goal 2: Patient will increase right shoulder AROM to [facility] for increased ability to put items into overhead cabinets. Long Term Goal 2 Progress: Met Long Term Goal 3: Patient will increase right shoulder strength to 4+/5 for increased ability to lift tools when working in his yard. Long Term Goal 3 Progress: Progressing toward goal Long Term Goal 4: Patient will decrease fascial restrictions to minimal in his right shoulder region. Long Term Goal 4 Progress: Met Long Term Goal 5: Patient will decrease his pain level to 1/10 in his right shoulder when reaching overhead. Long Term Goal 5 Progress: Met  Problem List Patient Active Problem List  Diagnosis  . SHOULDER PAIN  . IMPINGEMENT SYNDROME  . RUPTURE ROTATOR CUFF  . GERD (gastroesophageal reflux disease)  . Cirrhosis  . Rotator cuff insufficiency  . Rectal bleeding    End of Session Activity Tolerance: Patient tolerated treatment well General Behavior During Session: [facility] for tasks performed OT Plan of Care OT Home Exercise Plan: Theraband for scapular stability  GO Functional Assessment Tool Used: UEFI was 50% and is currently 90% Functional Limitation: Carrying, moving and handling objects Carrying, Moving and Handling Objects Goal Status (O1308): At least 1 percent but less than 20 percent impaired, limited or restricted Carrying, Moving and Handling Objects Discharge Status [phone]): At least 1  percent but less than 20 percent impaired, limited or restricted  Shirlean Mylar, OTR/L  10/24/2011, 4:21 PM

## 2011-11-06 ENCOUNTER — Telehealth (INDEPENDENT_AMBULATORY_CARE_PROVIDER_SITE_OTHER): Payer: Self-pay | Admitting: *Deleted

## 2011-11-06 ENCOUNTER — Encounter (INDEPENDENT_AMBULATORY_CARE_PROVIDER_SITE_OTHER): Payer: Self-pay | Admitting: *Deleted

## 2011-11-06 DIAGNOSIS — K7460 Unspecified cirrhosis of liver: Secondary | ICD-10-CM

## 2011-11-06 NOTE — Telephone Encounter (Signed)
Lab work due 

## 2011-11-26 LAB — AFP TUMOR MARKER: AFP-Tumor Marker: 3.1 ng/mL (ref 0.0–8.0)

## 2011-12-03 ENCOUNTER — Telehealth (INDEPENDENT_AMBULATORY_CARE_PROVIDER_SITE_OTHER): Payer: Self-pay | Admitting: *Deleted

## 2011-12-03 DIAGNOSIS — K7460 Unspecified cirrhosis of liver: Secondary | ICD-10-CM

## 2011-12-03 NOTE — Telephone Encounter (Signed)
Per Dr.[name] the patient will need to have AFP in 1 year.Noted for 12-02-12.

## 2011-12-25 ENCOUNTER — Encounter (INDEPENDENT_AMBULATORY_CARE_PROVIDER_SITE_OTHER): Payer: Self-pay

## 2012-02-10 ENCOUNTER — Other Ambulatory Visit (INDEPENDENT_AMBULATORY_CARE_PROVIDER_SITE_OTHER): Payer: Self-pay | Admitting: Internal Medicine

## 2012-06-15 ENCOUNTER — Encounter (HOSPITAL_COMMUNITY): Payer: Self-pay

## 2012-06-15 ENCOUNTER — Emergency Department (HOSPITAL_COMMUNITY): Payer: PRIVATE HEALTH INSURANCE

## 2012-06-15 ENCOUNTER — Inpatient Hospital Stay (HOSPITAL_COMMUNITY)
Admission: EM | Admit: 2012-06-15 | Discharge: 2012-06-17 | DRG: 864 | Disposition: A | Discharge status: Home / Self Care | Payer: PRIVATE HEALTH INSURANCE | Attending: Internal Medicine | Admitting: Internal Medicine

## 2012-06-15 DIAGNOSIS — I10 Essential (primary) hypertension: Secondary | ICD-10-CM | POA: Diagnosis present

## 2012-06-15 DIAGNOSIS — R509 Fever, unspecified: Principal | ICD-10-CM | POA: Diagnosis present

## 2012-06-15 DIAGNOSIS — T675XXA Heat exhaustion, unspecified, initial encounter: Secondary | ICD-10-CM | POA: Diagnosis present

## 2012-06-15 DIAGNOSIS — H9190 Unspecified hearing loss, unspecified ear: Secondary | ICD-10-CM | POA: Diagnosis present

## 2012-06-15 DIAGNOSIS — Z87891 Personal history of nicotine dependence: Secondary | ICD-10-CM

## 2012-06-15 DIAGNOSIS — R4182 Altered mental status, unspecified: Secondary | ICD-10-CM

## 2012-06-15 DIAGNOSIS — J4489 Other specified chronic obstructive pulmonary disease: Secondary | ICD-10-CM | POA: Diagnosis present

## 2012-06-15 DIAGNOSIS — M129 Arthropathy, unspecified: Secondary | ICD-10-CM | POA: Diagnosis present

## 2012-06-15 DIAGNOSIS — J449 Chronic obstructive pulmonary disease, unspecified: Secondary | ICD-10-CM | POA: Diagnosis present

## 2012-06-15 DIAGNOSIS — K7460 Unspecified cirrhosis of liver: Secondary | ICD-10-CM | POA: Diagnosis present

## 2012-06-15 DIAGNOSIS — J069 Acute upper respiratory infection, unspecified: Secondary | ICD-10-CM | POA: Diagnosis present

## 2012-06-15 DIAGNOSIS — X30XXXA Exposure to excessive natural heat, initial encounter: Secondary | ICD-10-CM | POA: Diagnosis present

## 2012-06-15 DIAGNOSIS — K219 Gastro-esophageal reflux disease without esophagitis: Secondary | ICD-10-CM | POA: Diagnosis present

## 2012-06-15 LAB — URINALYSIS, ROUTINE W REFLEX MICROSCOPIC
Bilirubin Urine: NEGATIVE
Hgb urine dipstick: NEGATIVE
Ketones, ur: NEGATIVE mg/dL
Nitrite: NEGATIVE
Specific Gravity, Urine: 1.02 (ref 1.005–1.030)
Urobilinogen, UA: 0.2 mg/dL (ref 0.0–1.0)
pH: 6 (ref 5.0–8.0)

## 2012-06-15 LAB — CBC WITH DIFFERENTIAL/PLATELET
Hemoglobin: 14 g/dL (ref 13.0–17.0)
Lymphocytes Relative: 7 % — ABNORMAL LOW (ref 12–46)
Lymphs Abs: 0.8 10*3/uL (ref 0.7–4.0)
Monocytes Relative: 6 % (ref 3–12)
Neutro Abs: 9.5 10*3/uL — ABNORMAL HIGH (ref 1.7–7.7)
Neutrophils Relative %: 87 % — ABNORMAL HIGH (ref 43–77)
Platelets: 222 10*3/uL (ref 150–400)
RBC: 4.51 MIL/uL (ref 4.22–5.81)
WBC: 10.9 10*3/uL — ABNORMAL HIGH (ref 4.0–10.5)

## 2012-06-15 LAB — COMPREHENSIVE METABOLIC PANEL
ALT: 14 U/L (ref 0–53)
Alkaline Phosphatase: 73 U/L (ref 39–117)
BUN: 21 mg/dL (ref 6–23)
Chloride: 102 mEq/L (ref 96–112)
GFR calc Af Amer: 44 mL/min — ABNORMAL LOW (ref >90)
Glucose, Bld: 154 mg/dL — ABNORMAL HIGH (ref 70–99)
Potassium: 3.7 mEq/L (ref 3.5–5.1)
Sodium: 140 mEq/L (ref 135–145)
Total Bilirubin: 0.6 mg/dL (ref 0.3–1.2)

## 2012-06-15 LAB — TROPONIN I: Troponin I: <0.3 ng/mL (ref <0.30)

## 2012-06-15 LAB — APTT: aPTT: 36 seconds (ref 24–37)

## 2012-06-15 LAB — LIPASE, BLOOD: Lipase: 25 U/L (ref 11–59)

## 2012-06-15 MED ORDER — SODIUM CHLORIDE 0.9 % IV BOLUS (SEPSIS)
1000.0000 mL | Freq: Once | INTRAVENOUS | Status: AC
  Administered 2012-06-15: 1000 mL via INTRAVENOUS

## 2012-06-15 MED ORDER — DEXTROSE 5 % IV SOLN
INTRAVENOUS | Status: AC
  Filled 2012-06-15 (×2): qty 10

## 2012-06-15 MED ORDER — IBUPROFEN 800 MG PO TABS
800.0000 mg | ORAL_TABLET | Freq: Once | ORAL | Status: AC
  Administered 2012-06-15: 800 mg via ORAL
  Filled 2012-06-15: qty 1

## 2012-06-15 MED ORDER — LIDOCAINE-EPINEPHRINE (PF) 1 %-1:200000 IJ SOLN
INTRAMUSCULAR | Status: AC
  Administered 2012-06-15: 22:00:00
  Filled 2012-06-15: qty 10

## 2012-06-15 MED ORDER — DEXTROSE 5 % IV SOLN
2.0000 g | Freq: Once | INTRAVENOUS | Status: AC
  Administered 2012-06-15: 2 g via INTRAVENOUS
  Filled 2012-06-15: qty 2

## 2012-06-15 NOTE — ED Notes (Signed)
Report called to [facility], pt being transferred to [facility] ED, accepting EDP is Eber Hong.

## 2012-06-15 NOTE — ED Notes (Signed)
Pt's neighbor reports he saw patient sitting outside at home wearing a jacket, shaking uncontrollably, SOB, and confused.  Pt says had been raking the yard.  Neighbor said that they live in an apartment and they don't rake leaves.    Pt alert at present denies any pain.  Reports still feels SOB.  Pt oriented to person and place.   Pt unable to recall the day of the week, month, or president.

## 2012-06-15 NOTE — ED Notes (Signed)
Pt unable to get urine specimen at this time   

## 2012-06-15 NOTE — ED Provider Notes (Signed)
History     CSN: [id number]  Arrival date & time 06/15/12  1736   First MD Initiated Contact with Patient 06/15/12 1748      Chief Complaint  Patient presents with  . Shortness of Breath    (Consider location/radiation/quality/duration/timing/severity/associated sxs/prior treatment) Patient is a 79 y.o. male presenting with shortness of breath.  Shortness of Breath  Level 5 caveat due to confusion, pt brought to the ED by neighbor who told triage nurse that he found the patient sitting outside at their apartment complex confused, wearing a thick sweater and shaking all over. Pt apparently said he had been 'raking leaves' although there are no leaves to be raked there. Pt is alert now, but disoriented to time. States he has been coughing for the last week, occasional productive of phlegm. He has had some dysuria but denies any abdominal pain, nausea or vomiting. No sore throat or headache. No neck stiffness.   Past Medical History  Diagnosis Date  . GERD (gastroesophageal reflux disease)   . Hypertension   . HOH (hard of hearing)   . Shortness of breath     exertion  . Arthritis     Past Surgical History  Procedure Laterality Date  . Cystoscopy  2000    Preformed to remove cyst from liver at [facility]  . Eye surgery      bilat KPE  . Colonoscopy  10/07/2011    Procedure: COLONOSCOPY;  Surgeon: Malissa Hippo, MD;  Location[facility];  Service: Endoscopy;  Laterality: N/A;  730    Family History  Problem Relation Age of Onset  . Heart disease Mother   . Heart disease Father   . Healthy Sister   . Arthritis      History  Substance Use Topics  . Smoking status: Former Smoker    Types: Cigarettes    Quit date: 10/22/1990  . Smokeless tobacco: Never Used  . Alcohol Use: No      Review of Systems  Respiratory: Positive for shortness of breath.    Unable to assess due to mental status.   Allergies  Acetaminophen and Ibuprofen  Home Medications    Current Outpatient Rx  Name  Route  Sig  Dispense  Refill  . albuterol (PROVENTIL HFA;VENTOLIN HFA) 108 (90 BASE) MCG/ACT inhaler   Inhalation   Inhale 2 puffs into the lungs every 6 (six) hours as needed.         . benazepril-hydrochlorthiazide (LOTENSIN HCT) 20-12.5 MG per tablet   Oral   Take 1 tablet by mouth daily.           . carbamide peroxide (DEBROX) 6.5 % otic solution   Both Ears   Place 5 drops into both ears as needed.           Marland Kitchen DEXILANT 60 MG capsule      TAKE 1 CAPSULE BY MOUTH ONCE DAILY FOR ACID REFLUX.   30 capsule   5   . ENSURE (ENSURE)   Oral   Take 1 Can by mouth daily.           . fluticasone (FLONASE) 50 MCG/ACT nasal spray   Nasal   Place 2 sprays into the nose every evening.           . furosemide (LASIX) 20 MG tablet   Oral   Take 20 mg by mouth daily.          . meclizine (ANTIVERT) 25 MG tablet  Oral   Take 25 mg by mouth daily.           . metoprolol succinate (TOPROL-XL) 50 MG 24 hr tablet   Oral   Take 50 mg by mouth daily.         . psyllium (METAMUCIL SMOOTH TEXTURE) 28 % packet   Oral   Take 1 packet by mouth at bedtime.         Marland Kitchen SPIRIVA HANDIHALER 18 MCG inhalation capsule   Inhalation   Place 18 mcg into inhaler and inhale daily.         Marland Kitchen ZETIA 10 MG tablet   Oral   Take 10 mg by mouth daily.           BP 141/81  Pulse 122  Temp(Src) 102.6 F (39.2 C) (Oral)  Resp 21  Ht 5\' 6"  (1.676 m)  Wt 180 lb (81.647 kg)  BMI 29.07 kg/m2  SpO2 96%  Physical Exam  Nursing note and vitals reviewed. Constitutional: He appears well-developed and well-nourished.  HENT:  Head: Normocephalic and atraumatic.  Eyes: EOM are normal. Pupils are equal, round, and reactive to light.  Neck: Normal range of motion. Neck supple.  Cardiovascular: Normal rate, normal heart sounds and intact distal pulses.   Pulmonary/Chest: Effort normal and breath sounds normal.  Abdominal: Bowel sounds are normal. He  exhibits no distension. There is no tenderness.  Musculoskeletal: Normal range of motion. He exhibits no edema and no tenderness.  Neurological: He is alert. He has normal strength. No cranial nerve deficit or sensory deficit.  Skin: Skin is warm and dry. No rash noted.  Psychiatric: He has a normal mood and affect.    ED Course  LUMBAR PUNCTURE Date/Time: 06/15/2012 10:07 PM Performed by: Susy Frizzle B. Authorized by: Pollyann Savoy Consent: Verbal consent obtained. written consent obtained. Risks and benefits: risks, benefits and alternatives were discussed Consent given by: patient Patient identity confirmed: verbally with patient Time out: Immediately prior to procedure a "time out" was called to verify the correct patient, procedure, equipment, support staff and site/side marked as required. Indications: evaluation for infection and evaluation for altered mental status Anesthesia: local infiltration Local anesthetic: lidocaine 1% with epinephrine Anesthetic total: 3 ml Patient sedated: no Preparation: Patient was prepped and draped in the usual sterile fashion. Lumbar space: L4-L5 interspace Patient's position: sitting Needle gauge: 20 Needle type: spinal needle - Quincke tip Number of attempts: 4 Comments: Unsuccessful attempt   (including critical care time)  Labs Reviewed  CBC WITH DIFFERENTIAL - Abnormal; Notable for the following:    WBC 10.9 (*)    Neutrophils Relative % 87 (*)    Neutro Abs 9.5 (*)    Lymphocytes Relative 7 (*)    All other components within normal limits  COMPREHENSIVE METABOLIC PANEL - Abnormal; Notable for the following:    Glucose, Bld 154 (*)    Creatinine, Ser 1.65 (*)    GFR calc non Af Amer 38 (*)    GFR calc Af Amer 44 (*)    All other components within normal limits  URINE CULTURE  LIPASE, BLOOD  URINALYSIS, ROUTINE W REFLEX MICROSCOPIC  TROPONIN I  PROTIME-INR  APTT  LACTIC ACID, PLASMA  CK   Dg Chest 2  View  06/15/2012   *RADIOLOGY REPORT*  Clinical Data: Fever, cough  CHEST - 2 VIEW  Comparison: CT 08/13/2009 and earlier studies  Findings: Hiatal hernia. Markedly tortuous descending thoracic aorta.  Mild cardiomegaly.  Lungs clear.  No effusion. Persistent elevation of the right diaphragmatic leaflet.  Regional bones unremarkable.  IMPRESSION:  1.  No acute disease.   Original Report Authenticated By: D. Andria Rhein, MD   Ct Head Wo Contrast  06/15/2012   *RADIOLOGY REPORT*  Clinical Data: Confusion, hypertension  CT HEAD WITHOUT CONTRAST  Technique:  Contiguous axial images were obtained from the base of the skull through the vertex without contrast.  Comparison: None.  Findings: Atherosclerotic and physiologic intracranial calcifications. Diffuse parenchymal atrophy. Patchy areas of hypoattenuation in deep and periventricular white matter bilaterally. Negative for acute intracranial hemorrhage, mass lesion, acute infarction, midline shift, or mass-effect. Acute infarct may be inapparent on noncontrast CT. Ventricles and sulci symmetric. Bone windows demonstrate no focal lesion.  IMPRESSION:  1. Negative for bleed or other acute intracranial process.  2. Atrophy and nonspecific white matter changes   Original Report Authenticated By: D. Andria Rhein, MD     1. Fever   2. Altered mental status       MDM  Pt febrile, but has allergies listed to APAP and Ibuprofen however IBU is apparently not a true allergy but contraindication due to previous blood clot. Pt did not have improvement in temp with IBU, unknown APAP allergy will hold on giving that now. Pt has no source identified on UA or CXR. He is still confused, unsure of baseline. Spoke with Hospitalist for admission but will hold off until LP can be done. CT head ordered pre-procedure.    Date: 06/15/2012  Rate: 115  Rhythm:? normal sinus rhythm, no defniite P-waves  QRS Axis: left  Intervals: QT prolonged  ST/T Wave abnormalities:  nonspecific T wave changes  Conduction Disutrbances:none  Narrative Interpretation:   Old EKG Reviewed: changes noted and previously P-waves more prominent   9:58 PM CT reviewed, attempted LP with verbal and emergent consent without success in multiple attempted. Discussed with Hospitalist here who requests transfer to [facility] for fluoro guided LP. Will discuss with [facility] as well as Radiology and ED docs there.        Charles B. Bernette Mayers, MD 06/15/12 2228

## 2012-06-15 NOTE — ED Notes (Signed)
Placed pt on 2L [place] O2 per pt request

## 2012-06-16 ENCOUNTER — Emergency Department (HOSPITAL_COMMUNITY): Payer: PRIVATE HEALTH INSURANCE

## 2012-06-16 ENCOUNTER — Encounter (HOSPITAL_COMMUNITY): Payer: Self-pay | Admitting: Internal Medicine

## 2012-06-16 DIAGNOSIS — R509 Fever, unspecified: Principal | ICD-10-CM

## 2012-06-16 DIAGNOSIS — J069 Acute upper respiratory infection, unspecified: Secondary | ICD-10-CM | POA: Diagnosis present

## 2012-06-16 DIAGNOSIS — R4182 Altered mental status, unspecified: Secondary | ICD-10-CM

## 2012-06-16 DIAGNOSIS — K7460 Unspecified cirrhosis of liver: Secondary | ICD-10-CM

## 2012-06-16 LAB — URINE CULTURE: Culture: NO GROWTH

## 2012-06-16 LAB — MRSA PCR SCREENING: MRSA by PCR: NEGATIVE

## 2012-06-16 LAB — AMMONIA: Ammonia: 30 umol/L (ref 11–60)

## 2012-06-16 MED ORDER — DEXTROSE 5 % IV SOLN
1.0000 g | INTRAVENOUS | Status: DC
  Administered 2012-06-16: 1 g via INTRAVENOUS
  Filled 2012-06-16 (×2): qty 10

## 2012-06-16 MED ORDER — TIOTROPIUM BROMIDE MONOHYDRATE 18 MCG IN CAPS
18.0000 ug | ORAL_CAPSULE | Freq: Every day | RESPIRATORY_TRACT | Status: DC
  Administered 2012-06-16 – 2012-06-17 (×2): 18 ug via RESPIRATORY_TRACT
  Filled 2012-06-16: qty 5

## 2012-06-16 MED ORDER — DEXTROSE 5 % IV SOLN
2.0000 g | Freq: Two times a day (BID) | INTRAVENOUS | Status: DC
  Filled 2012-06-16 (×2): qty 2

## 2012-06-16 MED ORDER — PSYLLIUM 95 % PO PACK
1.0000 | PACK | Freq: Every day | ORAL | Status: DC
  Administered 2012-06-16: 1 via ORAL
  Filled 2012-06-16 (×2): qty 1

## 2012-06-16 MED ORDER — HEPARIN SODIUM (PORCINE) 5000 UNIT/ML IJ SOLN
5000.0000 [IU] | Freq: Three times a day (TID) | INTRAMUSCULAR | Status: DC
  Administered 2012-06-16 – 2012-06-17 (×4): 5000 [IU] via SUBCUTANEOUS
  Filled 2012-06-16 (×7): qty 1

## 2012-06-16 MED ORDER — ENSURE COMPLETE PO LIQD
237.0000 mL | Freq: Two times a day (BID) | ORAL | Status: DC
  Administered 2012-06-17: 237 mL via ORAL

## 2012-06-16 MED ORDER — EZETIMIBE 10 MG PO TABS
10.0000 mg | ORAL_TABLET | Freq: Every day | ORAL | Status: DC
  Administered 2012-06-16 – 2012-06-17 (×2): 10 mg via ORAL
  Filled 2012-06-16 (×2): qty 1

## 2012-06-16 MED ORDER — PANTOPRAZOLE SODIUM 40 MG PO TBEC
40.0000 mg | DELAYED_RELEASE_TABLET | Freq: Every day | ORAL | Status: DC
  Administered 2012-06-16 – 2012-06-17 (×2): 40 mg via ORAL
  Filled 2012-06-16: qty 1

## 2012-06-16 MED ORDER — SODIUM CHLORIDE 0.9 % IJ SOLN
3.0000 mL | Freq: Two times a day (BID) | INTRAMUSCULAR | Status: DC
  Administered 2012-06-16 (×2): 3 mL via INTRAVENOUS

## 2012-06-16 MED ORDER — DEXTROSE-NACL 5-0.9 % IV SOLN
INTRAVENOUS | Status: DC
  Administered 2012-06-16: 03:00:00 via INTRAVENOUS

## 2012-06-16 MED ORDER — FLUTICASONE PROPIONATE 50 MCG/ACT NA SUSP
2.0000 | Freq: Every evening | NASAL | Status: DC
Start: 2012-06-16 — End: 2012-06-17
  Administered 2012-06-16: 2 via NASAL
  Filled 2012-06-16: qty 16

## 2012-06-16 MED ORDER — METOPROLOL SUCCINATE ER 50 MG PO TB24
50.0000 mg | ORAL_TABLET | Freq: Every day | ORAL | Status: DC
  Administered 2012-06-16 – 2012-06-17 (×2): 50 mg via ORAL
  Filled 2012-06-16 (×2): qty 1

## 2012-06-16 MED ORDER — ALBUTEROL SULFATE HFA 108 (90 BASE) MCG/ACT IN AERS: 2.0000 | INHALATION_SPRAY | Freq: Four times a day (QID) | RESPIRATORY_TRACT | Status: DC | PRN

## 2012-06-16 MED ORDER — VANCOMYCIN HCL IN DEXTROSE 1-5 GM/200ML-% IV SOLN
1000.0000 mg | INTRAVENOUS | Status: DC
  Administered 2012-06-16: 1000 mg via INTRAVENOUS
  Filled 2012-06-16: qty 200

## 2012-06-16 NOTE — H&P (Signed)
[facility] History and Physical  Kurt Burke AVW:[id number] DOB: 07/17/1929    PCP:   Kurt Pizza, MD   Chief Complaint: brought in by neighbor for altered mental status and fever.  HPI: Kurt Burke is an 79 y.o. male brought in by neighbor as he was found in a hot day with sweater on raking leaves and was confused.  He has no HA, stiff neck, nausea, vomiting or shortness of breath.  He was intermittently confused, with no focal neurological deficit.  Evaluation at [facility] showed clear CXR and UA, and Tmax was found to be 102.6 with no leukocytosis.  LP was attempted unsucessfully, and IR accepted transfer to [facility] for fluoroscopic LP by IR, but upon arrival to [facility] ER, IR can't no longer available to perform LP tonight.  When I saw him, he denied any HA, knows that he is at [facility], but doesn't know why he is here.  There has been some cough, but no HA, stiff neck, nausea, vomiting.  Hospitalist was asked to admit him for AMS and fever.  Rewiew of Systems:  Constitutional: Negative for malaise, fever and chills. No significant weight loss or weight gain Eyes: Negative for eye pain, redness and discharge, diplopia, visual changes, or flashes of light. ENMT: Negative for ear pain, hoarseness, nasal congestion, sinus pressure and sore throat. No headaches; tinnitus, drooling, or problem swallowing. Cardiovascular: Negative for chest pain, palpitations, diaphoresis, dyspnea and peripheral edema. ; No orthopnea, PND Respiratory: Negative for  hemoptysis, wheezing and stridor. No pleuritic chestpain. Gastrointestinal: Negative for nausea, vomiting, diarrhea, constipation, abdominal pain, melena, blood in stool, hematemesis, jaundice and rectal bleeding.    Genitourinary: Negative for frequency, dysuria, incontinence,flank pain and hematuria; Musculoskeletal: Negative for back pain and neck pain. Negative for swelling and trauma.;  Skin: . Negative for pruritus, rash, abrasions,  bruising and skin lesion.; ulcerations Neuro: Negative for headache, lightheadedness and neck stiffness. Negative for weakness, altered level of consciousness , altered mental status, extremity weakness, burning feet, involuntary movement, seizure and syncope.  Psych: negative for anxiety, depression, insomnia, tearfulness, panic attacks, hallucinations, paranoia, suicidal or homicidal ideation    Past Medical History  Diagnosis Date  . GERD (gastroesophageal reflux disease)   . Hypertension   . HOH (hard of hearing)   . Shortness of breath     exertion  . Arthritis     Past Surgical History  Procedure Laterality Date  . Cystoscopy  2000    Preformed to remove cyst from liver at [facility]  . Eye surgery      bilat KPE  . Colonoscopy  10/07/2011    Procedure: COLONOSCOPY;  Surgeon: Kurt Hippo, MD;  Location: [facility];  Service: Endoscopy;  Laterality: N/A;  730    Medications:  HOME MEDS: Prior to Admission medications   Medication Sig Start Date End Date Taking? Authorizing Provider  benazepril-hydrochlorthiazide (LOTENSIN HCT) 20-12.5 MG per tablet Take 2 tablets by mouth 2 (two) times daily.    Yes Historical Provider, MD  dexlansoprazole (DEXILANT) 60 MG capsule Take 60 mg by mouth daily.   Yes Historical Provider, MD  fluticasone (FLONASE) 50 MCG/ACT nasal spray Place 2 sprays into the nose every evening.     Yes Historical Provider, MD  meclizine (ANTIVERT) 25 MG tablet Take 25 mg by mouth 4 (four) times daily as needed.    Yes Historical Provider, MD  metoprolol succinate (TOPROL-XL) 50 MG 24 hr tablet Take 50 mg by  mouth daily. 09/12/11  Yes Historical Provider, MD  SPIRIVA HANDIHALER 18 MCG inhalation capsule Place 18 mcg into inhaler and inhale daily. 08/26/11  Yes Historical Provider, MD  ZETIA 10 MG tablet Take 10 mg by mouth daily. 08/26/11  Yes Historical Provider, MD  albuterol (PROVENTIL HFA;VENTOLIN HFA) 108 (90 BASE) MCG/ACT inhaler Inhale 2 puffs  into the lungs every 6 (six) hours as needed for wheezing or shortness of breath.     Historical Provider, MD  carbamide peroxide (DEBROX) 6.5 % otic solution Place 5 drops into both ears as needed.      Historical Provider, MD  ENSURE (ENSURE) Take 1 Can by mouth daily.      Historical Provider, MD  psyllium (METAMUCIL SMOOTH TEXTURE) 28 % packet Take 1 packet by mouth at bedtime. 10/07/11   Kurt Hippo, MD     Allergies:  Allergies  Allergen Reactions  . Acetaminophen   . Codeine Other (See Comments)    Unknown reaction  . Ibuprofen     MD took patient off as he had a blood clot in his leg    Social History:   reports that he quit smoking about 21 years ago. His smoking use included Cigarettes. He smoked 0.00 packs per day. He has never used smokeless tobacco. He reports that he does not drink alcohol or use illicit drugs.  Family History: Family History  Problem Relation Age of Onset  . Heart disease Mother   . Heart disease Father   . Healthy Sister   . Arthritis       Physical Exam: Filed Vitals:   06/15/12 2155 06/15/12 2219 06/15/12 2234 06/16/12 0008  BP:   132/88 121/77  Pulse:  118 111 106  Temp: 98.3 F (36.8 C) 98.3 F (36.8 C) 99.7 F (37.6 C) 98.7 F (37.1 C)  TempSrc: Oral  Oral Oral  Resp:   18 26  Height:      Weight:      SpO2:   95% 99%   Blood pressure 121/77, pulse 106, temperature 98.7 F (37.1 C), temperature source Oral, resp. rate 26, height 5\' 6"  (1.676 m), weight 81.647 kg (180 lb), SpO2 99.00%.  GEN:  Pleasant  patient lying in the stretcher in no acute distress; cooperative with exam. PSYCH:  alert and oriented x4; does not appear anxious or depressed; affect is appropriate. HEENT: Mucous membranes pink and anicteric; PERRLA; EOM intact; no cervical lymphadenopathy nor thyromegaly or carotid bruit; no JVD; There were no stridor. Neck is very supple. Breasts:: Not examined CHEST WALL: No tenderness CHEST: Normal respiration, clear  to auscultation bilaterally.  HEART: Regular rate and rhythm.  There are no murmur, rub, or gallops.   BACK: No kyphosis or scoliosis; no CVA tenderness ABDOMEN: soft and non-tender; no masses, no organomegaly, normal abdominal bowel sounds; no pannus; no intertriginous candida. There is no rebound and no distention. Rectal Exam: Not done EXTREMITIES: No bone or joint deformity; age-appropriate arthropathy of the hands and knees; no edema; no ulcerations.  There is no calf tenderness. Genitalia: not examined PULSES: 2+ and symmetric SKIN: Normal hydration no rash or ulceration CNS: Cranial nerves 2-12 grossly intact no focal lateralizing neurologic deficit.  Speech is fluent; uvula elevated with phonation, facial symmetry and tongue midline. DTR are normal bilaterally, cerebella exam is intact, barbinski is negative and strengths are equaled bilaterally.  No sensory loss.   Labs on Admission:  Basic Metabolic Panel:  Recent Labs Lab 06/15/12 1818  NA  140  K 3.7  CL 102  CO2 28  GLUCOSE 154*  BUN 21  CREATININE 1.65*  CALCIUM 9.7   Liver Function Tests:  Recent Labs Lab 06/15/12 1818  AST 20  ALT 14  ALKPHOS 73  BILITOT 0.6  PROT 7.1  ALBUMIN 3.5    Recent Labs Lab 06/15/12 1818  LIPASE 25   No results found for this basename: AMMONIA,  in the last 168 hours CBC:  Recent Labs Lab 06/15/12 1818  WBC 10.9*  NEUTROABS 9.5*  HGB 14.0  HCT 41.3  MCV 91.6  PLT 222   Cardiac Enzymes:  Recent Labs Lab 06/15/12 1818  CKTOTAL 136  TROPONINI <0.30    CBG: No results found for this basename: GLUCAP,  in the last 168 hours   Radiological Exams on Admission: Dg Chest 2 View  06/15/2012   *RADIOLOGY REPORT*  Clinical Data: Fever, cough  CHEST - 2 VIEW  Comparison: CT 08/13/2009 and earlier studies  Findings: Hiatal hernia. Markedly tortuous descending thoracic aorta.  Mild cardiomegaly.    Lungs clear.  No effusion. Persistent elevation of the right  diaphragmatic leaflet.  Regional bones unremarkable.  IMPRESSION:  1.  No acute disease.   Original Report Authenticated By: D. Andria Rhein, MD   Ct Head Wo Contrast  06/15/2012   *RADIOLOGY REPORT*  Clinical Data: Confusion, hypertension  CT HEAD WITHOUT CONTRAST  Technique:  Contiguous axial images were obtained from the base of the skull through the vertex without contrast.  Comparison: None.  Findings: Atherosclerotic and physiologic intracranial calcifications. Diffuse parenchymal atrophy. Patchy areas of hypoattenuation in deep and periventricular white matter bilaterally. Negative for acute intracranial hemorrhage, mass lesion, acute infarction, midline shift, or mass-effect. Acute infarct may be inapparent on noncontrast CT. Ventricles and sulci symmetric. Bone windows demonstrate no focal lesion.  IMPRESSION:  1. Negative for bleed or other acute intracranial process.  2. Atrophy and nonspecific white matter changes   Original Report Authenticated By: D. Andria Rhein, MD    Assessment/Plan Present on Admission:  . Altered mental status . Cirrhosis . URI (upper respiratory infection) . Fever  PLAN:  This gentleman has fever of 102.6 and was confused, so I think it is reasonable to obtain a LP, which I suspect will be negative.  I will start him on Rocephin/Van pending the LP, but I really don't think this is a presentation of a bacterial meningitis.  He does have some symptoms of URI.  I will get HSV ab also with the LP hopefully to be done as soon as possible.  In the interim, since he is having elevated Cr slightly, will hold his LotensinHCT for now.  He supposedly lives alone, and I don't know his baseline so its hard to tell.  With hs of cirrhosis, will obtain NH3 level also.  He is stable, presumped full code, and will be admitted to [facility] service.  Thank you for allowing me to partake in the care of this patient.  Other plans as per orders.  Code Status: FULL Unk Lightning,  MD. [facility] Pager [phone] 7pm to 7am.  06/16/2012, 12:50 AM

## 2012-06-16 NOTE — Clinical Documentation Improvement (Signed)
CHANGE MENTAL STATUS DOCUMENTATION CLARIFICATION   THIS DOCUMENT IS NOT A PERMANENT PART OF THE MEDICAL RECORD  TO RESPOND TO THE THIS QUERY, FOLLOW THE INSTRUCTIONS BELOW:  1. If needed, update documentation for the patient's encounter via the notes activity.  2. Access this query again and click edit on the In [employer].  3. After updating, or not, click F2 to complete all highlighted (required) fields concerning your review. Select "additional documentation in the medical record" OR "no additional documentation provided".  4. Click Sign note button.  5. The deficiency will fall out of your In Basket *Please let us know if you are not able to complete this workflow by phone or e-mail (listed below).         06/16/12  Dear Dr. Lavera Guise,  In an effort to better capture your patient's severity of illness, reflect appropriate length of stay and utilization of resources, a review of the patient medical record has revealed the following indicators.    Please clarify term "AMS, altered mental status".  Thank you.  Possible Clinical Conditions?  - Encephalopathy resolved                        Septic                       Alcoholic                        Hepatic                       Metabolic                       Toxic - Acute confusion - Acute delirium - Other Condition (please specify)  Supporting Information: - Risk Factors: Acute febrile illness, Cirrhosis - Signs & Symptoms: admitted with Altered Mental Status, confused - Treatment: LP, Ucx/UA, Ammonia, multiple diagnostic tests  Reviewed: additional documentation in the medical record  Thank You,  Beverley Fiedler RN BSN Clinical Documentation Specialist: Tele Contact:  [phone]  Health Information Management [place]

## 2012-06-16 NOTE — Progress Notes (Signed)
INITIAL NUTRITION ASSESSMENT  DOCUMENTATION CODES Per approved criteria  -Not Applicable   INTERVENTION: 1. Ensure Complete po BID, each supplement provides 350 kcal and 13 grams of protein.  2. Educated pt on importance of balanced meals and potential missing nutrients.   NUTRITION DIAGNOSIS: Inadequate oral intake related to decreased appetite as evidenced by food recall.   Goal: Pt to meet >/= 90% of their estimated nutrition needs.  Monitor:  PO intake, supplement acceptance, weight trend, labs  Reason for Assessment: Pt identified as at nutrition risk on the Malnutrition Screen Tool  79 y.o. male  Admitting Dx: Altered mental status  ASSESSMENT: Pt report poor appetite PTA. 24 hr recall reviewed. Per pt he eats eggs and cheese or sausage for Breakfast. He eats a can of vegetables for his evening meal (3pm). Pt lives alone.  Pt denies recent weight loss.   Nutrition Focused Physical Exam:  Subcutaneous Fat:  Orbital Region: WNL Upper Arm Region: WNL Thoracic and Lumbar Region: WNL  Muscle:  Temple Region: WNL Clavicle Bone Region: WNL Clavicle and Acromion Bone Region: WNL Scapular Bone Region: WNL Dorsal Hand: WNL Patellar Region: WNL Anterior Thigh Region: WNL Posterior Calf Region: WNL  Edema: not present  Height: Ht Readings from Last 1 Encounters:  06/16/12 5\' 8"  (1.727 m)    Weight: Wt Readings from Last 1 Encounters:  06/16/12 172 lb 13.5 oz (78.4 kg)    Ideal Body Weight: 70 kg  % Ideal Body Weight: 112%  Wt Readings from Last 10 Encounters:  06/16/12 172 lb 13.5 oz (78.4 kg)  10/07/11 180 lb (81.647 kg)  10/07/11 180 lb (81.647 kg)  09/26/11 180 lb 14.4 oz (82.056 kg)  09/23/11 179 lb (81.194 kg)  10/22/10 180 lb (81.647 kg)  10/17/08 185 lb (83.915 kg)    Usual Body Weight: 180 lb   % Usual Body Weight: 96%  BMI:  Body mass index is 26.29 kg/(m^2). Overweight  Estimated Nutritional Needs: Kcal: 1700-1900 Protein: 80-90  grams Fluid: > 1.7L /day  Skin: no issues noted  Diet Order: General Meal Completion: >50% Lunch   EDUCATION NEEDS: -No education needs identified at this time   Intake/Output Summary (Last 24 hours) at 06/16/12 1521 Last data filed at 06/16/12 0400  Gross per 24 hour  Intake      0 ml  Output    300 ml  Net   -300 ml    Last BM: 6/10   Labs:   Recent Labs Lab 06/15/12 1818  NA 140  K 3.7  CL 102  CO2 28  BUN 21  CREATININE 1.65*  CALCIUM 9.7  GLUCOSE 154*    CBG (last 3)  No results found for this basename: GLUCAP,  in the last 72 hours  Scheduled Meds: . cefTRIAXone (ROCEPHIN)  IV  1 g Intravenous Q24H  . ezetimibe  10 mg Oral Daily  . fluticasone  2 spray Each Nare QPM  . heparin  5,000 Units Subcutaneous Q8H  . metoprolol succinate  50 mg Oral Daily  . pantoprazole  40 mg Oral Daily  . psyllium  1 packet Oral Daily  . sodium chloride  3 mL Intravenous Q12H  . tiotropium  18 mcg Inhalation Daily    Continuous Infusions:   Past Medical History  Diagnosis Date  . GERD (gastroesophageal reflux disease)   . Hypertension   . HOH (hard of hearing)   . Shortness of breath     exertion  . Arthritis  Past Surgical History  Procedure Laterality Date  . Cystoscopy  2000    Preformed to remove cyst from liver at [facility]  . Eye surgery      bilat KPE  . Colonoscopy  10/07/2011    Procedure: COLONOSCOPY;  Surgeon: Malissa Hippo, MD;  Location: [facility];  Service: Endoscopy;  Laterality: N/A;  [address], LDN, CNSC [phone] Pager [phone] After Hours Pager

## 2012-06-16 NOTE — ED Notes (Signed)
Radiologist unable to perform DG guide tonight. MD Palumbo made aware. Pt made aware.

## 2012-06-16 NOTE — ED Notes (Signed)
Pt keeps attempting to get out of bed. Appears confused. Pt reoriented and went through safety precautions again with pt. Admitting MD and EDP at bedside.

## 2012-06-16 NOTE — Progress Notes (Addendum)
[facility] PROGRESS NOTE  Kurt Burke ZOX:[id number] DOB: [birth year] DOA: 06/15/2012 PCP: Kurt Pizza, MD  Assessment/Plan: 1. Febrile illness acute - found by neighbour in front of apartment complex with sweater on shaking uncontrollably - ? Etiology - CXR without infiltrates, UA clear, not confused, alert and oriented x3. ? Heat exhaustion ? Heat stroke - vs infection. Started empirically on high dose rocephin and vancomycin on 6/9 . By 6/10 patient is afebrile and alert and oriented x3. Continue rocephin iv until urine culture available.  2. COPD  3. Hx of cirrhosis - seems compensated 4. Acute confusion on admission - resolved  Code Status: full  Family Communication: patient  Disposition Plan: home    Antibiotics: Rocephin   HPI/Subjective: wants to go home . He does not remember events for yesterday   Objective: Filed Vitals:   06/16/12 0008 06/16/12 0130 06/16/12 0212 06/16/12 0615  BP: 121/77 133/95 134/91 112/60  Pulse: 106 103 98 90  Temp: 98.7 F (37.1 C)  97.8 F (36.6 C) 98.9 F (37.2 C)  TempSrc: Oral  Oral Oral  Resp: 26  20 18   Height:   5\' 8"  (1.727 m)   Weight:   78.4 kg (172 lb 13.5 oz)   SpO2: 99% 97% 95% 98%   Patient Vitals for the past 24 hrs:  BP Temp Temp src Pulse Resp SpO2 Height Weight  06/16/12 0615 112/60 mmHg 98.9 F (37.2 C) Oral 90 18 98 % - -  06/16/12 0212 134/91 mmHg 97.8 F (36.6 C) Oral 98 20 95 % 5\' 8"  (1.727 m) 78.4 kg (172 lb 13.5 oz)  06/16/12 0130 133/95 mmHg - - 103 - 97 % - -  06/16/12 0008 121/77 mmHg 98.7 F (37.1 C) Oral 106 26 99 % - -  06/15/12 2234 132/88 mmHg 99.7 F (37.6 C) Oral 111 18 95 % - -  06/15/12 2219 - 98.3 F (36.8 C) - 118 - - - -  06/15/12 2155 - 98.3 F (36.8 C) Oral - - - - -  06/15/12 2001 132/74 mmHg 103.1 F (39.5 C) Oral 118 18 93 % - -  06/15/12 1814 - - - - - 96 % - -  06/15/12 1746 141/81 mmHg 102.6 F (39.2 C) Oral 122 21 96 % 5\' 6"  (1.676 m) 81.647 kg (180 lb)      Intake/Output Summary (Last 24 hours) at 06/16/12 0842 Last data filed at 06/16/12 0400  Gross per 24 hour  Intake      0 ml  Output    300 ml  Net   -300 ml   Filed Weights   06/15/12 1746 06/16/12 0212  Weight: 81.647 kg (180 lb) 78.4 kg (172 lb 13.5 oz)    Exam:   General:  axox3  Cardiovascular: rrr  Respiratory: ctab   Abdomen: soft, nt   Musculoskeletal: intact    Data Reviewed: Basic Metabolic Panel:  Recent Labs Lab 06/15/12 1818  NA 140  K 3.7  CL 102  CO2 28  GLUCOSE 154*  BUN 21  CREATININE 1.65*  CALCIUM 9.7   Liver Function Tests:  Recent Labs Lab 06/15/12 1818  AST 20  ALT 14  ALKPHOS 73  BILITOT 0.6  PROT 7.1  ALBUMIN 3.5    Recent Labs Lab 06/15/12 1818  LIPASE 25    Recent Labs Lab 06/16/12 0600  AMMONIA 30   CBC:  Recent Labs Lab 06/15/12 1818  WBC 10.9*  NEUTROABS 9.5*  HGB 14.0  HCT 41.3  MCV 91.6  PLT 222   Cardiac Enzymes:  Recent Labs Lab 06/15/12 1818  CKTOTAL 136  TROPONINI <0.30   BNP (last 3 results) No results found for this basename: PROBNP,  in the last 8760 hours CBG: No results found for this basename: GLUCAP,  in the last 168 hours  Recent Results (from the past 240 hour(s))  MRSA PCR SCREENING     Status: None   Collection Time    06/16/12  4:21 AM      Result Value Range Status   MRSA by PCR NEGATIVE  NEGATIVE Final   Comment:            The GeneXpert MRSA Assay (FDA     approved for NASAL specimens     only), is one component of a     comprehensive MRSA colonization     surveillance program. It is not     intended to diagnose MRSA     infection nor to guide or     monitor treatment for     MRSA infections.     Studies: Dg Chest 2 View  06/15/2012   *RADIOLOGY REPORT*  Clinical Data: Fever, cough  CHEST - 2 VIEW  Comparison: CT 08/13/2009 and earlier studies  Findings: Hiatal hernia. Markedly tortuous descending thoracic aorta.  Mild cardiomegaly.    Lungs clear.  No  effusion. Persistent elevation of the right diaphragmatic leaflet.  Regional bones unremarkable.  IMPRESSION:  1.  No acute disease.   Original Report Authenticated By: D. Andria Rhein, MD   Ct Head Wo Contrast  06/15/2012   *RADIOLOGY REPORT*  Clinical Data: Confusion, hypertension  CT HEAD WITHOUT CONTRAST  Technique:  Contiguous axial images were obtained from the base of the skull through the vertex without contrast.  Comparison: None.  Findings: Atherosclerotic and physiologic intracranial calcifications. Diffuse parenchymal atrophy. Patchy areas of hypoattenuation in deep and periventricular white matter bilaterally. Negative for acute intracranial hemorrhage, mass lesion, acute infarction, midline shift, or mass-effect. Acute infarct may be inapparent on noncontrast CT. Ventricles and sulci symmetric. Bone windows demonstrate no focal lesion.  IMPRESSION:  1. Negative for bleed or other acute intracranial process.  2. Atrophy and nonspecific white matter changes   Original Report Authenticated By: D. Andria Rhein, MD    Scheduled Meds: . cefTRIAXone (ROCEPHIN)  IV  2 g Intravenous Q12H  . ezetimibe  10 mg Oral Daily  . fluticasone  2 spray Each Nare QPM  . heparin  5,000 Units Subcutaneous Q8H  . metoprolol succinate  50 mg Oral Daily  . pantoprazole  40 mg Oral Daily  . psyllium  1 packet Oral Daily  . sodium chloride  3 mL Intravenous Q12H  . tiotropium  18 mcg Inhalation Daily  . vancomycin  1,000 mg Intravenous Q24H   Continuous Infusions: . dextrose 5 % and 0.9% NaCl 75 mL/hr at 06/16/12 0232    Principal Problem:   Altered mental status Active Problems:   Cirrhosis   URI (upper respiratory infection)   Fever        Kurt Burke  [facility] Pager [phone]. If 7PM-7AM, please contact night-coverage at [email or web address], password [facility] 06/16/2012, 8:42 AM  LOS: 1 day

## 2012-06-16 NOTE — Progress Notes (Signed)
ANTIBIOTIC CONSULT NOTE - INITIAL  Pharmacy Consult for Rocephin and Vancocin Indication: r/o meningitis  Allergies  Allergen Reactions  . Acetaminophen   . Codeine Other (See Comments)    Unknown reaction  . Ibuprofen     MD took patient off as he had a blood clot in his leg    Patient Measurements: Height: 5\' 8"  (172.7 cm) Weight: 172 lb 13.5 oz (78.4 kg) IBW/kg (Calculated) : 68.4  Vital Signs: Temp: 97.8 F (36.6 C) (06/10 0212) Temp src: Oral (06/10 0212) BP: 134/91 mmHg (06/10 0212) Pulse Rate: 98 (06/10 0212)  Labs:  Recent Labs  06/15/12 1818  WBC 10.9*  HGB 14.0  PLT 222  CREATININE 1.65*   Estimated Creatinine Clearance: 34.5 ml/min (by C-G formula based on Cr of 1.65).   Microbiology: No results found for this or any previous visit (from the past 720 hour(s)).  Medical History: Past Medical History  Diagnosis Date  . GERD (gastroesophageal reflux disease)   . Hypertension   . HOH (hard of hearing)   . Shortness of breath     exertion  . Arthritis     Medications:  Prescriptions prior to admission  Medication Sig Dispense Refill  . benazepril-hydrochlorthiazide (LOTENSIN HCT) 20-12.5 MG per tablet Take 2 tablets by mouth 2 (two) times daily.       Marland Kitchen dexlansoprazole (DEXILANT) 60 MG capsule Take 60 mg by mouth daily.      . fluticasone (FLONASE) 50 MCG/ACT nasal spray Place 2 sprays into the nose every evening.        . meclizine (ANTIVERT) 25 MG tablet Take 25 mg by mouth 4 (four) times daily as needed.       . metoprolol succinate (TOPROL-XL) 50 MG 24 hr tablet Take 50 mg by mouth daily.      Marland Kitchen SPIRIVA HANDIHALER 18 MCG inhalation capsule Place 18 mcg into inhaler and inhale daily.      Marland Kitchen ZETIA 10 MG tablet Take 10 mg by mouth daily.      Marland Kitchen albuterol (PROVENTIL HFA;VENTOLIN HFA) 108 (90 BASE) MCG/ACT inhaler Inhale 2 puffs into the lungs every 6 (six) hours as needed for wheezing or shortness of breath.       . carbamide peroxide (DEBROX) 6.5  % otic solution Place 5 drops into both ears as needed.        . ENSURE (ENSURE) Take 1 Can by mouth daily.        . psyllium (METAMUCIL SMOOTH TEXTURE) 28 % packet Take 1 packet by mouth at bedtime.       Scheduled:  . cefTRIAXone (ROCEPHIN)  IV  2 g Intravenous Q12H  . ezetimibe  10 mg Oral Daily  . fluticasone  2 spray Each Nare QPM  . heparin  5,000 Units Subcutaneous Q8H  . metoprolol succinate  50 mg Oral Daily  . pantoprazole  40 mg Oral Daily  . psyllium  1 packet Oral Daily  . sodium chloride  3 mL Intravenous Q12H  . tiotropium  18 mcg Inhalation Daily  . vancomycin  1,000 mg Intravenous Q24H   Infusions:  . dextrose 5 % and 0.9% NaCl 75 mL/hr at 06/16/12 1610    Assessment: 79yo male was found by neighbor to be raking leaves in sweater despite warm temps, eval at [facility] showed clear CXR and UA, fever to 102.6, admitted for AMS with potential for meningitis, to begin IV ABX.  Goal of Therapy:  Vancomycin trough level 15-20 mcg/ml  Plan:  Will begin Rocephin 2g IV Q12H and vancomycin 1000mg  IV Q24H and monitor CBC, Cx, levels prn.  Vernard Gambles, PharmD, BCPS  06/16/2012,3:22 AM

## 2012-06-17 LAB — BASIC METABOLIC PANEL
BUN: 20 mg/dL (ref 6–23)
Chloride: 98 mEq/L (ref 96–112)
GFR calc Af Amer: 49 mL/min — ABNORMAL LOW (ref >90)
GFR calc non Af Amer: 42 mL/min — ABNORMAL LOW (ref >90)
Glucose, Bld: 96 mg/dL (ref 70–99)
Potassium: 3.8 mEq/L (ref 3.5–5.1)
Sodium: 136 mEq/L (ref 135–145)

## 2012-06-17 LAB — TSH: TSH: 2.237 u[IU]/mL (ref 0.350–4.500)

## 2012-06-17 LAB — CBC
HCT: 41.1 % (ref 39.0–52.0)
Hemoglobin: 13.8 g/dL (ref 13.0–17.0)
RDW: 13.9 % (ref 11.5–15.5)
WBC: 12.7 10*3/uL — ABNORMAL HIGH (ref 4.0–10.5)

## 2012-06-17 LAB — VITAMIN B12: Vitamin B-12: 495 pg/mL (ref 211–911)

## 2012-06-17 MED ORDER — ALBUTEROL SULFATE HFA 108 (90 BASE) MCG/ACT IN AERS: 2.0000 | INHALATION_SPRAY | Freq: Four times a day (QID) | RESPIRATORY_TRACT | Status: DC | PRN

## 2012-06-17 MED ORDER — SODIUM CHLORIDE 0.9 % IV SOLN: INTRAVENOUS | Status: DC

## 2012-06-17 MED ORDER — TIOTROPIUM BROMIDE MONOHYDRATE 18 MCG IN CAPS: 18.0000 ug | ORAL_CAPSULE | Freq: Every day | RESPIRATORY_TRACT | Status: DC

## 2012-06-17 NOTE — Evaluation (Signed)
Occupational Therapy Evaluation Patient Details Name: Kurt Burke MRN: [id number] DOB: 05/18/1929 Today's Date: 06/17/2012 Time: 4098-1191 OT Time Calculation (min): 33 min  OT Assessment / Plan / Recommendation Clinical Impression    79 y.o. male brought in by neighbor as he was found in a hot day with sweater on raking leaves and was confused. He has no HA, stiff neck, nausea, vomiting or shortness of breath. He was intermittently confused, with no focal neurological deficit. Evaluation at [facility] ER showed clear CXR and UA, and Tmax was found to be 102.6 with no leukocytosis. LP was attempted unsucessfully, and IR accepted transfer to [facility] for fluoroscopic LP by IR, but upon arrival to [facility] ER, IR can't no longer available to perform LP tonight. Pt admitted for AMS and fever. Ot to sign off acutely and recommend HHOT.     OT Assessment  All further OT needs can be met in the next venue of care    Follow Up Recommendations  Home health OT (safety eval)    Barriers to Discharge      Equipment Recommendations  None recommended by OT    Recommendations for Other Services    Frequency       Precautions / Restrictions Precautions Precautions: Fall   Pertinent Vitals/Pain No pain reported Pt very eager to go home    ADL  Upper Body Dressing: Modified independent Where Assessed - Upper Body Dressing: Unsupported sit to stand Lower Body Dressing: Modified independent Where Assessed - Lower Body Dressing: Unsupported sit to stand Toilet Transfer: Modified independent Toilet Transfer Method: Sit to stand Toilet Transfer Equipment: Regular height toilet Toileting - Clothing Manipulation and Hygiene: Modified independent Where Assessed - Toileting Clothing Manipulation and Hygiene: Sit to stand from 3-in-1 or toilet Tub/Shower Transfer: Supervision/safety Tub/Shower Transfer Method: Ambulating Equipment Used: Gait belt;Cane Transfers/Ambulation Related to ADLs: Pt ambulated carrying  quad cane however not utilizing it properly. pt carries the cane more than placing it on the floor for support.  ADL Comments: pt with cognitive deficits at executive level but suspect this is baseline. Pt completed UB and LB dressing for home without (A). pt completed tub transfer. pt unable to recall reason for no longer driving but reports it happen about 15 years ago. pt recalling neighbors name and reason for admission the doctors can't figure out. Pt [facility] card and showing therapist. Pt noted to be dyspnea 3 out 4 with LB dressing. Pt reports using an inhaler at baseline     OT Diagnosis:    OT Problem List:   OT Treatment Interventions:     OT Goals    Visit Information  Last OT Received On: 06/17/12 Assistance Needed: +1    Subjective Data  Subjective: "I can't wait to get home and get into the tub" Patient Stated Goal: to return home to neighbor Kurt Burke"   Prior Functioning     Home Living Lives With: Alone Available Help at Discharge: Friend(s) (use taxi cab for transportation) Type of Home: Apartment Home Access: Stairs to enter [employer] of Steps: 1 Home Layout: One level Bathroom Shower/Tub: Forensic scientist: Standard Home Adaptive Equipment: Quad cane Prior Function Level of Independence: Independent with assistive device(s) Able to Take Stairs?: Yes Driving: No Vocation: Retired Musician: HOH Dominant Hand: Right         Vision/Perception Vision - History Baseline Vision: Wears glasses all the time Vision - Assessment Vision Assessment: Vision tested Ocular Range of Motion: Within Functional  Limits Alignment/Gaze Preference: Within Defined Limits Tracking/Visual Pursuits: Able to track stimulus in all quads without difficulty Convergence: Within functional limits   Cognition  Cognition Arousal/Alertness: Awake/alert Behavior During Therapy: WFL for tasks assessed/performed Overall  Cognitive Status: Impaired/Different from baseline Area of Impairment: Memory Memory: Decreased short-term memory General Comments: Suspect pt is at or near baseline with executive deficits     Extremity/Trunk Assessment Right Upper Extremity Assessment RUE ROM/Strength/Tone: Deficits RUE ROM/Strength/Tone Deficits: arthritis at shoulder AAROM 90 degrees RUE Coordination: WFL - gross/fine motor Left Upper Extremity Assessment LUE ROM/Strength/Tone: WFL for tasks assessed LUE Coordination: WFL - gross/fine motor Trunk Assessment Trunk Assessment: Normal     Mobility Bed Mobility Bed Mobility: Not assessed Transfers Transfers: Sit to Stand;Stand to Sit Sit to Stand: 6: Modified independent (Device/Increase time);With upper extremity assist;From chair/3-in-1 Stand to Sit: 6: Modified independent (Device/Increase time);With upper extremity assist;To chair/3-in-1 Details for Transfer Assistance: pt using bil ue to complete sit<>Stand     Exercise     Balance     End of Session OT - End of Session Activity Tolerance: Patient tolerated treatment well Patient left: in chair;with call bell/phone within reach Nurse Communication: Mobility status;Precautions  GO     Kurt Burke 06/17/2012, 10:47 AM Pager: [phone]

## 2012-06-17 NOTE — Progress Notes (Signed)
Occupational Therapy Discharge Patient Details Name: HERSCHELL VIRANI MRN: [id number] DOB: [birth year] Today's Date: 06/17/2012 Time: 9563-8756 OT Time Calculation (min): 33 min  Patient discharged from OT services secondary to goals met and no further OT needs identified.  Please see latest therapy progress note for current level of functioning and progress toward goals.    Progress and discharge plan discussed with patient and/or caregiver: Patient/Caregiver agrees with plan  GO     Lucile Shutters 06/17/2012, 10:48 AM

## 2012-06-17 NOTE — Care Management Note (Signed)
    Page 1 of 1   06/17/2012     3:26:24 PM   CARE MANAGEMENT NOTE 06/17/2012  Patient:  Kurt Burke, Kurt Burke   Account Number:  [id number]  Date Initiated:  06/16/2012  Documentation initiated by:  [facility]  Subjective/Objective Assessment:   admitted with AMS, fever  lives alone     Action/Plan:   PT/OT evals, recommending HH PT/OT.  Pt declined home PT/OT   Anticipated DC Date:  06/19/2012   Anticipated DC Plan:  HOME W HOME HEALTH SERVICES      DC Planning Services  CM consult      Choice offered to / List presented to:             Status of service:  Completed, signed off Medicare Important Message given?   (If response is "NO", the following Medicare IM given date fields will be blank) Date Medicare IM given:   Date Additional Medicare IM given:    Discharge Disposition:  HOME/SELF CARE  Per UR Regulation:  Reviewed for med. necessity/level of care/duration of stay  If discussed at Long Length of Stay Meetings, dates discussed:    Comments:  06/17/12 1500  Elmer Bales RN, MSN, CM-  Spoke with patient via phone to discuss setting up home health PT/OT. Pt refused services at this time.  Pt was encouraged to contact his PCP if he feels he needs services at a later date.

## 2012-06-17 NOTE — Discharge Summary (Signed)
Physician Discharge Summary  CORNELL BOURBON MRN: [id number] DOB/AGE: 06/21/1929 79 y.o.  PCP: Catalina Pizza, MD   Admit date: 06/15/2012 Discharge date: 06/17/2012  Discharge Diagnoses:      Altered mental status, metabolic encephalopathy Active Problems:   Cirrhosis   URI (upper respiratory infection)   Fever     Medication List    TAKE these medications       albuterol 108 (90 BASE) MCG/ACT inhaler  Commonly known as:  PROVENTIL HFA;VENTOLIN HFA  Inhale 2 puffs into the lungs every 6 (six) hours as needed for wheezing or shortness of breath.     benazepril-hydrochlorthiazide 20-12.5 MG per tablet  Commonly known as:  LOTENSIN HCT  Take 2 tablets by mouth 2 (two) times daily.     carbamide peroxide 6.5 % otic solution  Commonly known as:  DEBROX  Place 5 drops into both ears as needed.     DEXILANT 60 MG capsule  Generic drug:  dexlansoprazole  Take 60 mg by mouth daily.     ENSURE  Take 1 Can by mouth daily.     fluticasone 50 MCG/ACT nasal spray  Commonly known as:  FLONASE  Place 2 sprays into the nose every evening.     meclizine 25 MG tablet  Commonly known as:  ANTIVERT  Take 25 mg by mouth 4 (four) times daily as needed.     metoprolol succinate 50 MG 24 hr tablet  Commonly known as:  TOPROL-XL  Take 50 mg by mouth daily.     psyllium 28 % packet  Commonly known as:  METAMUCIL SMOOTH TEXTURE  Take 1 packet by mouth at bedtime.     SPIRIVA HANDIHALER 18 MCG inhalation capsule  Generic drug:  tiotropium  Place 18 mcg into inhaler and inhale daily.     ZETIA 10 MG tablet  Generic drug:  ezetimibe  Take 10 mg by mouth daily.        Discharge Condition: *Stable  Disposition: 01-Home or Self Care   Consults: None   Significant Diagnostic Studies: Dg Chest 2 View  06/15/2012   *RADIOLOGY REPORT*  Clinical Data: Fever, cough  CHEST - 2 VIEW  Comparison: CT 08/13/2009 and earlier studies  Findings: Hiatal hernia. Markedly tortuous  descending thoracic aorta.  Mild cardiomegaly.    Lungs clear.  No effusion. Persistent elevation of the right diaphragmatic leaflet.  Regional bones unremarkable.  IMPRESSION:  1.  No acute disease.   Original Report Authenticated By: D. Andria Rhein, MD   Ct Head Wo Contrast  06/15/2012   *RADIOLOGY REPORT*  Clinical Data: Confusion, hypertension  CT HEAD WITHOUT CONTRAST  Technique:  Contiguous axial images were obtained from the base of the skull through the vertex without contrast.  Comparison: None.  Findings: Atherosclerotic and physiologic intracranial calcifications. Diffuse parenchymal atrophy. Patchy areas of hypoattenuation in deep and periventricular white matter bilaterally. Negative for acute intracranial hemorrhage, mass lesion, acute infarction, midline shift, or mass-effect. Acute infarct may be inapparent on noncontrast CT. Ventricles and sulci symmetric. Bone windows demonstrate no focal lesion.  IMPRESSION:  1. Negative for bleed or other acute intracranial process.  2. Atrophy and nonspecific white matter changes   Original Report Authenticated By: D. Andria Rhein, MD       Microbiology: Recent Results (from the past 240 hour(s))  URINE CULTURE     Status: None   Collection Time    06/15/12  7:15 PM      Result Value  Range Status   Specimen Description URINE, CLEAN CATCH   Final   Special Requests NONE   Final   Culture  Setup Time 06/15/2012 20:00   Final   Colony Count NO GROWTH   Final   Culture NO GROWTH   Final   Report Status 06/16/2012 FINAL   Final  MRSA PCR SCREENING     Status: None   Collection Time    06/16/12  4:21 AM      Result Value Range Status   MRSA by PCR NEGATIVE  NEGATIVE Final   Comment:            The GeneXpert MRSA Assay (FDA     approved for NASAL specimens     only), is one component of a     comprehensive MRSA colonization     surveillance program. It is not     intended to diagnose MRSA     infection nor to guide or     monitor  treatment for     MRSA infections.     Labs: Results for orders placed during the hospital encounter of 06/15/12 (from the past 48 hour(s))  CBC WITH DIFFERENTIAL     Status: Abnormal   Collection Time    06/15/12  6:18 PM      Result Value Range   WBC 10.9 (*) 4.0 - 10.5 K/uL   RBC 4.51  4.22 - 5.81 MIL/uL   Hemoglobin 14.0  13.0 - 17.0 g/dL   HCT 40.9  81.1 - 91.4 %   MCV 91.6  78.0 - 100.0 fL   MCH 31.0  26.0 - 34.0 pg   MCHC 33.9  30.0 - 36.0 g/dL   RDW 78.2  95.6 - 21.3 %   Platelets 222  150 - 400 K/uL   Neutrophils Relative % 87 (*) 43 - 77 %   Neutro Abs 9.5 (*) 1.7 - 7.7 K/uL   Lymphocytes Relative 7 (*) 12 - 46 %   Lymphs Abs 0.8  0.7 - 4.0 K/uL   Monocytes Relative 6  3 - 12 %   Monocytes Absolute 0.6  0.1 - 1.0 K/uL   Eosinophils Relative 0  0 - 5 %   Eosinophils Absolute 0.0  0.0 - 0.7 K/uL   Basophils Relative 0  0 - 1 %   Basophils Absolute 0.0  0.0 - 0.1 K/uL  COMPREHENSIVE METABOLIC PANEL     Status: Abnormal   Collection Time    06/15/12  6:18 PM      Result Value Range   Sodium 140  135 - 145 mEq/L   Potassium 3.7  3.5 - 5.1 mEq/L   Chloride 102  96 - 112 mEq/L   CO2 28  19 - 32 mEq/L   Glucose, Bld 154 (*) 70 - 99 mg/dL   BUN 21  6 - 23 mg/dL   Creatinine, Ser 0.86 (*) 0.50 - 1.35 mg/dL   Calcium 9.7  8.4 - 57.8 mg/dL   Total Protein 7.1  6.0 - 8.3 g/dL   Albumin 3.5  3.5 - 5.2 g/dL   AST 20  0 - 37 U/L   ALT 14  0 - 53 U/L   Alkaline Phosphatase 73  39 - 117 U/L   Total Bilirubin 0.6  0.3 - 1.2 mg/dL   GFR calc non Af Amer 38 (*) >90 mL/min   GFR calc Af Amer 44 (*) >90 mL/min   Comment:  The eGFR has been calculated     using the CKD EPI equation.     This calculation has not been     validated in all clinical     situations.     eGFR's persistently     <90 mL/min signify     possible Chronic Kidney Disease.  LIPASE, BLOOD     Status: None   Collection Time    06/15/12  6:18 PM      Result Value Range   Lipase 25  11 - 59  U/L  TROPONIN I     Status: None   Collection Time    06/15/12  6:18 PM      Result Value Range   Troponin I <0.30  <0.30 ng/mL   Comment:            Due to the release kinetics of cTnI,     a negative result within the first hours     of the onset of symptoms does not rule out     myocardial infarction with certainty.     If myocardial infarction is still suspected,     repeat the test at appropriate intervals.  PROTIME-INR     Status: None   Collection Time    06/15/12  6:18 PM      Result Value Range   Prothrombin Time 14.1  11.6 - 15.2 seconds   INR 1.10  0.00 - 1.49  APTT     Status: None   Collection Time    06/15/12  6:18 PM      Result Value Range   aPTT 36  24 - 37 seconds  CK     Status: None   Collection Time    06/15/12  6:18 PM      Result Value Range   Total CK 136  7 - 232 U/L  LACTIC ACID, PLASMA     Status: None   Collection Time    06/15/12  6:23 PM      Result Value Range   Lactic Acid, Venous 2.1  0.5 - 2.2 mmol/L  URINALYSIS, ROUTINE W REFLEX MICROSCOPIC     Status: None   Collection Time    06/15/12  7:15 PM      Result Value Range   Color, Urine YELLOW  YELLOW   APPearance CLEAR  CLEAR   Specific Gravity, Urine 1.020  1.005 - 1.030   pH 6.0  5.0 - 8.0   Glucose, UA NEGATIVE  NEGATIVE mg/dL   Hgb urine dipstick NEGATIVE  NEGATIVE   Bilirubin Urine NEGATIVE  NEGATIVE   Ketones, ur NEGATIVE  NEGATIVE mg/dL   Protein, ur NEGATIVE  NEGATIVE mg/dL   Urobilinogen, UA 0.2  0.0 - 1.0 mg/dL   Nitrite NEGATIVE  NEGATIVE   Leukocytes, UA NEGATIVE  NEGATIVE   Comment: MICROSCOPIC NOT DONE ON URINES WITH NEGATIVE PROTEIN, BLOOD, LEUKOCYTES, NITRITE, OR GLUCOSE <1000 mg/dL.  URINE CULTURE     Status: None   Collection Time    06/15/12  7:15 PM      Result Value Range   Specimen Description URINE, CLEAN CATCH     Special Requests NONE     Culture  Setup Time 06/15/2012 20:00     Colony Count NO GROWTH     Culture NO GROWTH     Report Status  06/16/2012 FINAL    MRSA PCR SCREENING     Status: None   Collection Time    06/16/12  4:21 AM      Result Value Range   MRSA by PCR NEGATIVE  NEGATIVE   Comment:            The GeneXpert MRSA Assay (FDA     approved for NASAL specimens     only), is one component of a     comprehensive MRSA colonization     surveillance program. It is not     intended to diagnose MRSA     infection nor to guide or     monitor treatment for     MRSA infections.  AMMONIA     Status: None   Collection Time    06/16/12  6:00 AM      Result Value Range   Ammonia 30  11 - 60 umol/L  CBC     Status: Abnormal   Collection Time    06/17/12  6:18 AM      Result Value Range   WBC 12.7 (*) 4.0 - 10.5 K/uL   RBC 4.56  4.22 - 5.81 MIL/uL   Hemoglobin 13.8  13.0 - 17.0 g/dL   HCT 16.1  09.6 - 04.5 %   MCV 90.1  78.0 - 100.0 fL   MCH 30.3  26.0 - 34.0 pg   MCHC 33.6  30.0 - 36.0 g/dL   RDW 40.9  81.1 - 91.4 %   Platelets 183  150 - 400 K/uL  BASIC METABOLIC PANEL     Status: Abnormal   Collection Time    06/17/12  6:18 AM      Result Value Range   Sodium 136  135 - 145 mEq/L   Potassium 3.8  3.5 - 5.1 mEq/L   Chloride 98  96 - 112 mEq/L   CO2 28  19 - 32 mEq/L   Glucose, Bld 96  70 - 99 mg/dL   BUN 20  6 - 23 mg/dL   Creatinine, Ser 7.82 (*) 0.50 - 1.35 mg/dL   Calcium 9.9  8.4 - 95.6 mg/dL   GFR calc non Af Amer 42 (*) >90 mL/min   GFR calc Af Amer 49 (*) >90 mL/min   Comment:            The eGFR has been calculated     using the CKD EPI equation.     This calculation has not been     validated in all clinical     situations.     eGFR's persistently     <90 mL/min signify     possible Chronic Kidney Disease.     HPI : 79 y.o. male brought in by neighbor as he was found in a hot day with sweater on raking leaves and was confused. He has no HA, stiff neck, nausea, vomiting or shortness of breath. He was intermittently confused, with no focal neurological deficit. Evaluation at [facility]  showed clear CXR and UA, and Tmax was found to be 102.6 with no leukocytosis. LP was attempted unsucessfully, and IR accepted transfer to [facility] for fluoroscopic LP by IR, but upon arrival to [facility] ER, IR can't no longer available to perform LP tonight. When I saw him, he denied any HA, knows that he is at [facility], but doesn't know why he is here. There has been some cough, but no HA, stiff neck, nausea, vomiting. Hospitalist was asked to admit him for AMS and fever.   HOSPITAL COURSE:   Assessment/Plan:  1. Febrile illness acute - found by  neighbour in front of apartment complex with sweater on shaking uncontrollably - ? Etiology - CXR without infiltrates, UA clear, not confused, alert and oriented x3. ? Heat exhaustion ? Heat stroke - vs infection. Started empirically on high dose rocephin and vancomycin on 6/9 . By 6/10 patient is afebrile and alert and oriented x3. All cultures negative thus far. No blood culture drawn by the ER. Fever resolved. TSH, vitamin B 12 are still pending, CT of the head was negative. 2. COPD stable, 100% on 2 L of nasal cannula 3. Hx of cirrhosis - seems compensated 4. Acute confusion/metabolic encephalopathy due to heat exhaustion, on admission - resolved 5.    Discharge Exam:  Blood pressure 114/82, pulse 86, temperature 98.2 F (36.8 C), temperature source Oral, resp. rate 18, height 5\' 8"  (1.727 m), weight 78.4 kg (172 lb 13.5 oz), SpO2 100.00%.  General: axox3  Cardiovascular: rrr  Respiratory: ctab  Abdomen: soft, nt  Musculoskeletal: intact           Signed: Jaymie Misch 06/17/2012, 8:23 AM

## 2012-06-17 NOTE — Evaluation (Signed)
Physical Therapy Evaluation and Discharge Patient Details Name: Kurt Burke MRN: [id number] DOB: 08/15/1929 Today's Date: 06/17/2012 Time: 8295-6213 PT Time Calculation (min): 28 min  PT Assessment / Plan / Recommendation Clinical Impression  79 y.o. male brought in by neighbor as he was found in a hot day with sweater on raking leaves and was confused. Transferred from [facility] with fever and AMS. Presents to PT today at what appears to be his baseline with impaired safety awareness and balance deficits scoring a 40/56 on Berg balance test demonstrating significant risk for falls (80%). Patient was made aware of his risk and the benefits of physical therapy. Would benefit from a full balance work up and training from HHPT however discussed this with him and I doubt he will be agreeable. Agree with [facility] safety evaluation. No further acute PT needs at this time.     PT Assessment  All further PT needs can be met in the next venue of care    Follow Up Recommendations  Home health PT;Supervision - Intermittent, HH safety evaluation    Does the patient have the potential to tolerate intense rehabilitation      Barriers to Discharge        Equipment Recommendations  None recommended by PT    Recommendations for Other Services     Frequency      Precautions / Restrictions Precautions Precautions: Fall Restrictions Weight Bearing Restrictions: No   Pertinent Vitals/Pain Denies pain      Mobility  Bed Mobility Bed Mobility: Not assessed Transfers Sit to Stand: 6: Modified independent (Device/Increase time);With upper extremity assist;From chair/3-in-1 Stand to Sit: 6: Modified independent (Device/Increase time);With upper extremity assist;To chair/3-in-1 Details for Transfer Assistance: pt using bil ue to complete sit<>Stand Ambulation/Gait Ambulation/Gait Assistance: 5: Supervision Ambulation Distance (Feet): 400 Feet Assistive device: Small based quad cane Ambulation/Gait  Assistance Details: supervision for safety as pt ambulates with slow speed and poor sequencing with quad cane, at times puts the cane right in front of his right foot and has to step around it to clear it, no overt LOB during gait but does exhibit increased lateral sway with wide base Gait Pattern: Step-through pattern;Wide base of support Gait velocity: decreased, able to adjust speed some with speed changes however doesn't appear adequate for crossing roads General Gait Details: decreased step height and stride length Stairs: Yes Stairs Assistance: 5: Supervision Stairs Assistance Details (indicate cue type and reason): supervision for safety as pt did trip once on the stairs but was able to recover without assist Stair Management Technique: With cane;Alternating pattern Number of Stairs: 10 Wheelchair Mobility Wheelchair Mobility: No         PT Diagnosis: Abnormality of gait  PT Problem List: Decreased balance;Decreased safety awareness PT Treatment Interventions:       Visit Information  Last PT Received On: 06/17/12 Assistance Needed: +1    Subjective Data  Subjective: Im ready to go home (pt walking in the hallway with his bags in tow walking towards the exit when I found him)   Prior Functioning  Home Living Lives With: Alone Available Help at Discharge: Friend(s) (use taxi cab for transportation) Type of Home: Apartment Home Access: Stairs to enter Secretary/administrator of Steps: 1 Home Layout: One level Bathroom Shower/Tub: Forensic scientist: Standard Home Adaptive Equipment: Quad cane Prior Function Level of Independence: Independent with assistive device(s) Able to Take Stairs?: Yes Driving: No Vocation: Retired Musician: [employer] Dominant Hand: Right    Cognition  Cognition Arousal/Alertness: Awake/alert Behavior During Therapy: WFL for tasks assessed/performed Overall Cognitive Status: Impaired/Different from  baseline Area of Impairment: Memory Memory: Decreased short-term memory General Comments: Suspect pt is at or near baseline with executive deficits     Extremity/Trunk Assessment Right Upper Extremity Assessment RUE ROM/Strength/Tone: Deficits RUE ROM/Strength/Tone Deficits: arthritis at shoulder AAROM 90 degrees RUE Coordination: WFL - gross/fine motor Left Upper Extremity Assessment LUE ROM/Strength/Tone: WFL for tasks assessed LUE Coordination: WFL - gross/fine motor Right Lower Extremity Assessment RLE ROM/Strength/Tone: WFL for tasks assessed Left Lower Extremity Assessment LLE ROM/Strength/Tone: WFL for tasks assessed Trunk Assessment Trunk Assessment: Normal   Balance Standardized Balance Assessment Standardized Balance Assessment: Berg Balance Test;Dynamic Gait Index Berg Balance Test Sit to Stand: Able to stand  independently using hands Standing Unsupported: Able to stand safely 2 minutes Sitting with Back Unsupported but Feet Supported on Floor or Stool: Able to sit safely and securely 2 minutes Stand to Sit: Sits safely with minimal use of hands Transfers: Able to transfer safely, minor use of hands Standing Unsupported with Eyes Closed: Able to stand 10 seconds with supervision Standing Ubsupported with Feet Together: Able to place feet together independently and stand for 1 minute with supervision From Standing, Reach Forward with Outstretched Arm: Can reach confidently >25 cm (10") From Standing Position, Pick up Object from Floor: Able to pick up shoe, needs supervision From Standing Position, Turn to Look Behind Over each Shoulder: Looks behind one side only/other side shows less weight shift Turn 360 Degrees: Able to turn 360 degrees safely but slowly Standing Unsupported, Alternately Place Feet on Step/Stool: Able to complete >2 steps/needs minimal assist Standing Unsupported, One Foot in Front: Able to take small step independently and hold 30 seconds Standing on  One Leg: Unable to try or needs assist to prevent fall Total Score: 40 Dynamic Gait Index Level Surface: Mild Impairment Change in Gait Speed: Mild Impairment Gait with Horizontal Head Turns: Mild Impairment Gait with Vertical Head Turns: Mild Impairment Gait and Pivot Turn: Mild Impairment Step Over Obstacle: Mild Impairment Step Around Obstacles: Normal Steps: Mild Impairment Total Score: 17 High Level Balance High Level Balance Activites: Backward walking High Level Balance Comments: mingaurdA for backward walking, lost balance x2 and staggered to the right, did not need physical assist  End of Session PT - End of Session Equipment Utilized During Treatment: Gait belt Activity Tolerance: Patient tolerated treatment well Patient left: Other (comment) (standing in his room waiting to be d/c'd) Nurse Communication: Mobility status  GP     [facility] HELEN 06/17/2012, 11:13 AM

## 2012-07-21 ENCOUNTER — Emergency Department (HOSPITAL_COMMUNITY): Payer: PRIVATE HEALTH INSURANCE

## 2012-07-21 ENCOUNTER — Encounter (HOSPITAL_COMMUNITY): Payer: Self-pay

## 2012-07-21 ENCOUNTER — Emergency Department (HOSPITAL_COMMUNITY)
Admission: EM | Admit: 2012-07-21 | Discharge: 2012-07-21 | Disposition: A | Discharge status: Home / Self Care | Payer: PRIVATE HEALTH INSURANCE | Attending: Emergency Medicine | Admitting: Emergency Medicine

## 2012-07-21 DIAGNOSIS — K219 Gastro-esophageal reflux disease without esophagitis: Secondary | ICD-10-CM | POA: Insufficient documentation

## 2012-07-21 DIAGNOSIS — R0609 Other forms of dyspnea: Secondary | ICD-10-CM | POA: Insufficient documentation

## 2012-07-21 DIAGNOSIS — R109 Unspecified abdominal pain: Secondary | ICD-10-CM | POA: Insufficient documentation

## 2012-07-21 DIAGNOSIS — H9190 Unspecified hearing loss, unspecified ear: Secondary | ICD-10-CM | POA: Insufficient documentation

## 2012-07-21 DIAGNOSIS — IMO0002 Reserved for concepts with insufficient information to code with codable children: Secondary | ICD-10-CM | POA: Insufficient documentation

## 2012-07-21 DIAGNOSIS — M7989 Other specified soft tissue disorders: Secondary | ICD-10-CM | POA: Insufficient documentation

## 2012-07-21 DIAGNOSIS — Z79899 Other long term (current) drug therapy: Secondary | ICD-10-CM | POA: Insufficient documentation

## 2012-07-21 DIAGNOSIS — Z8739 Personal history of other diseases of the musculoskeletal system and connective tissue: Secondary | ICD-10-CM | POA: Insufficient documentation

## 2012-07-21 DIAGNOSIS — R0600 Dyspnea, unspecified: Secondary | ICD-10-CM

## 2012-07-21 DIAGNOSIS — R0989 Other specified symptoms and signs involving the circulatory and respiratory systems: Secondary | ICD-10-CM | POA: Insufficient documentation

## 2012-07-21 DIAGNOSIS — R0602 Shortness of breath: Secondary | ICD-10-CM | POA: Insufficient documentation

## 2012-07-21 DIAGNOSIS — I10 Essential (primary) hypertension: Secondary | ICD-10-CM | POA: Insufficient documentation

## 2012-07-21 DIAGNOSIS — Z87891 Personal history of nicotine dependence: Secondary | ICD-10-CM | POA: Insufficient documentation

## 2012-07-21 LAB — COMPREHENSIVE METABOLIC PANEL
AST: 27 U/L (ref 0–37)
Albumin: 3.7 g/dL (ref 3.5–5.2)
Alkaline Phosphatase: 71 U/L (ref 39–117)
Chloride: 99 mEq/L (ref 96–112)
Potassium: 3.7 mEq/L (ref 3.5–5.1)
Total Bilirubin: 0.5 mg/dL (ref 0.3–1.2)

## 2012-07-21 LAB — CBC WITH DIFFERENTIAL/PLATELET
Basophils Absolute: 0 10*3/uL (ref 0.0–0.1)
Basophils Relative: 0 % (ref 0–1)
MCHC: 34.1 g/dL (ref 30.0–36.0)
Neutro Abs: 3.9 10*3/uL (ref 1.7–7.7)
Neutrophils Relative %: 66 % (ref 43–77)
Platelets: 236 10*3/uL (ref 150–400)
RDW: 13.6 % (ref 11.5–15.5)

## 2012-07-21 LAB — URINALYSIS, ROUTINE W REFLEX MICROSCOPIC
Glucose, UA: NEGATIVE mg/dL
Hgb urine dipstick: NEGATIVE
Protein, ur: NEGATIVE mg/dL

## 2012-07-21 LAB — POCT I-STAT TROPONIN I: Troponin i, poc: 0 ng/mL (ref 0.00–0.08)

## 2012-07-21 LAB — PRO B NATRIURETIC PEPTIDE: Pro B Natriuretic peptide (BNP): 374.6 pg/mL (ref 0–450)

## 2012-07-21 MED ORDER — SODIUM CHLORIDE 0.9 % IV SOLN
Freq: Once | INTRAVENOUS | Status: AC
  Administered 2012-07-21: 15:00:00 via INTRAVENOUS

## 2012-07-21 NOTE — ED Notes (Signed)
Pt reports woke up around 4am with abd bloating and difficulty breathing.  Reports went to Dr. Margo Aye and was given restora.  Pt says isn't any better.

## 2012-07-21 NOTE — ED Provider Notes (Signed)
History  This chart was scribed for Kurt Human, MD by Bennett Scrape, ED Scribe. This patient was seen in room APA17/APA17 and the patient's care was started at 2:02 PM.  CSN: [id number] Arrival date & time 07/21/12  1358  First MD Initiated Contact with Patient 07/21/12 1402     CC: Abdominal Bloating  The history is provided by the patient. No language interpreter was used.   HPI Comments: Kurt Burke is a 79 y.o. male who presents to the Emergency Department complaining of gradual onset, constant abdominal bloating described as a "lump" with associated difficulty breathing, bilateral leg swelling and excessive belching that woke him up at 4 AM this morning. He was seen by Dr. Margo Aye today for the same and given restora with no improvement. Neighbor present with pt reports the the pt also c/o dizziness this morning but is unable to provide further detail. He denies abdominal pain, nausea and emesis as associated symptoms.  Past Medical History  Diagnosis Date  . GERD (gastroesophageal reflux disease)   . Hypertension   . HOH (hard of hearing)   . Shortness of breath     exertion  . Arthritis    Past Surgical History  Procedure Laterality Date  . Cystoscopy  2000    Preformed to remove cyst from liver at [facility]  . Eye surgery      bilat KPE  . Colonoscopy  10/07/2011    Procedure: COLONOSCOPY;  Surgeon: Malissa Hippo, MD;  Location: [facility];  Service: Endoscopy;  Laterality: N/A;  730   Family History  Problem Relation Age of Onset  . Heart disease Mother   . Heart disease Father   . Healthy Sister   . Arthritis     History  Substance Use Topics  . Smoking status: Former Smoker    Types: Cigarettes    Quit date: 10/22/1990  . Smokeless tobacco: Never Used  . Alcohol Use: No    Review of Systems  Respiratory: Positive for shortness of breath. Negative for cough.   Cardiovascular: Positive for leg swelling. Negative for chest pain.   Gastrointestinal: Positive for abdominal distention. Negative for nausea, vomiting, abdominal pain and diarrhea.  All other systems reviewed and are negative.    Allergies  Acetaminophen; Codeine; and Ibuprofen  Home Medications   Current Outpatient Rx  Name  Route  Sig  Dispense  Refill  . albuterol (PROVENTIL HFA;VENTOLIN HFA) 108 (90 BASE) MCG/ACT inhaler   Inhalation   Inhale 2 puffs into the lungs every 6 (six) hours as needed for wheezing or shortness of breath.   1 Inhaler   1   . benazepril-hydrochlorthiazide (LOTENSIN HCT) 20-12.5 MG per tablet   Oral   Take 2 tablets by mouth 2 (two) times daily.          . carbamide peroxide (DEBROX) 6.5 % otic solution   Both Ears   Place 5 drops into both ears as needed.           Marland Kitchen dexlansoprazole (DEXILANT) 60 MG capsule   Oral   Take 60 mg by mouth daily.         Marland Kitchen ENSURE (ENSURE)   Oral   Take 1 Can by mouth daily.           . fluticasone (FLONASE) 50 MCG/ACT nasal spray   Nasal   Place 2 sprays into the nose every evening.           Marland Kitchen  meclizine (ANTIVERT) 25 MG tablet   Oral   Take 25 mg by mouth 4 (four) times daily as needed.          . metoprolol succinate (TOPROL-XL) 50 MG 24 hr tablet   Oral   Take 50 mg by mouth daily.         . psyllium (METAMUCIL SMOOTH TEXTURE) 28 % packet   Oral   Take 1 packet by mouth at bedtime.         Marland Kitchen tiotropium (SPIRIVA HANDIHALER) 18 MCG inhalation capsule   Inhalation   Place 1 capsule (18 mcg total) into inhaler and inhale daily.   30 capsule   12   . ZETIA 10 MG tablet   Oral   Take 10 mg by mouth daily.          There were no vitals taken for this visit.  Physical Exam  Nursing note and vitals reviewed. Constitutional: He is oriented to person, place, and time. He appears well-developed and well-nourished. No distress.  HENT:  Head: Normocephalic and atraumatic.  Mouth/Throat: Oropharynx is clear and moist.  Eyes: Conjunctivae and EOM are  normal. Pupils are equal, round, and reactive to light.  Neck: Neck supple. No tracheal deviation present.  Cardiovascular: Normal rate and regular rhythm.   Pulmonary/Chest: Effort normal and breath sounds normal. No respiratory distress.  Abdominal: Soft. There is no tenderness.  Musculoskeletal: Normal range of motion. He exhibits edema (1+ edema in ankles bilaterally).  Neurological: He is alert and oriented to person, place, and time.  Skin: Skin is warm and dry.  Psychiatric: He has a normal mood and affect. His behavior is normal.     ED Course  Procedures (including critical care time)   COORDINATION OF CARE: 2:16 PM-Discussed treatment plan which includes CXR, CBC panel, CMP and UA with pt at bedside and pt agreed to plan.   Review of old charts shows an admission in June 2014 for altered mental status, COPD, cirrhosis of liver.    Date: 07/21/2012  Rate: 82  Rhythm: normal sinus rhythm  QRS Axis: left  Intervals: PR prolonged QRS:  Z waves in inferior leads suggest old inferior myocardial infarction.  ST/T Wave abnormalities: normal  Conduction Disutrbances:first-degree A-V block   Narrative Interpretation: Abnormal EKG  Old EKG Reviewed: unchanged   4:40 PM Results for orders placed during the hospital encounter of 07/21/12  CBC WITH DIFFERENTIAL      Result Value Range   WBC 5.9  4.0 - 10.5 K/uL   RBC 4.51  4.22 - 5.81 MIL/uL   Hemoglobin 14.0  13.0 - 17.0 g/dL   HCT 56.2  13.0 - 86.5 %   MCV 91.1  78.0 - 100.0 fL   MCH 31.0  26.0 - 34.0 pg   MCHC 34.1  30.0 - 36.0 g/dL   RDW 78.4  69.6 - 29.5 %   Platelets 236  150 - 400 K/uL   Neutrophils Relative % 66  43 - 77 %   Neutro Abs 3.9  1.7 - 7.7 K/uL   Lymphocytes Relative 26  12 - 46 %   Lymphs Abs 1.5  0.7 - 4.0 K/uL   Monocytes Relative 7  3 - 12 %   Monocytes Absolute 0.4  0.1 - 1.0 K/uL   Eosinophils Relative 1  0 - 5 %   Eosinophils Absolute 0.1  0.0 - 0.7 K/uL   Basophils Relative 0  0 - 1 %  Basophils Absolute 0.0  0.0 - 0.1 K/uL  COMPREHENSIVE METABOLIC PANEL      Result Value Range   Sodium 134 (*) 135 - 145 mEq/L   Potassium 3.7  3.5 - 5.1 mEq/L   Chloride 99  96 - 112 mEq/L   CO2 27  19 - 32 mEq/L   Glucose, Bld 103 (*) 70 - 99 mg/dL   BUN 17  6 - 23 mg/dL   Creatinine, Ser 1.61  0.50 - 1.35 mg/dL   Calcium 09.6  8.4 - 04.5 mg/dL   Total Protein 7.2  6.0 - 8.3 g/dL   Albumin 3.7  3.5 - 5.2 g/dL   AST 27  0 - 37 U/L   ALT 13  0 - 53 U/L   Alkaline Phosphatase 71  39 - 117 U/L   Total Bilirubin 0.5  0.3 - 1.2 mg/dL   GFR calc non Af Amer 49 (*) >90 mL/min   GFR calc Af Amer 57 (*) >90 mL/min  URINALYSIS, ROUTINE W REFLEX MICROSCOPIC      Result Value Range   Color, Urine YELLOW  YELLOW   APPearance CLEAR  CLEAR   Specific Gravity, Urine 1.015  1.005 - 1.030   pH 7.0  5.0 - 8.0   Glucose, UA NEGATIVE  NEGATIVE mg/dL   Hgb urine dipstick NEGATIVE  NEGATIVE   Bilirubin Urine NEGATIVE  NEGATIVE   Ketones, ur NEGATIVE  NEGATIVE mg/dL   Protein, ur NEGATIVE  NEGATIVE mg/dL   Urobilinogen, UA 0.2  0.0 - 1.0 mg/dL   Nitrite NEGATIVE  NEGATIVE   Leukocytes, UA NEGATIVE  NEGATIVE  PRO B NATRIURETIC PEPTIDE      Result Value Range   Pro B Natriuretic peptide (BNP) 374.6  0 - 450 pg/mL  AMMONIA      Result Value Range   Ammonia 18  11 - 60 umol/L  POCT I-STAT TROPONIN I      Result Value Range   Troponin i, poc 0.00  0.00 - 0.08 ng/mL   Comment 3            Dg Chest 2 View  07/21/2012   *RADIOLOGY REPORT*  Clinical Data: Shortness of breath.  CHEST - 2 VIEW  Comparison: Chest x-ray 06/15/2012.  Findings: Mild elevation of the right hemidiaphragm is unchanged. No acute consolidative air space disease.  No definite pleural effusions.  Moderate sized hiatal hernia.  No evidence of pulmonary edema.  Heart size is within normal limits.  Atherosclerosis in the thoracic aorta.  IMPRESSION: 1.  No radiographic evidence of acute cardiopulmonary disease. 2.  Atherosclerosis. 3.   Moderate sized hiatal hernia.   Original Report Authenticated By: Trudie Reed, M.D.    Lab workup is negative.  No medical reason for him to be admitted.  Reassured and released home.  1. Dyspnea     I personally performed the services described in this documentation, which was scribed in my presence. The recorded information has been reviewed and is accurate.  Kurt Human, MD      Carleene Cooper III, MD 07/21/12 [phone]

## 2012-08-07 ENCOUNTER — Other Ambulatory Visit (INDEPENDENT_AMBULATORY_CARE_PROVIDER_SITE_OTHER): Payer: Self-pay | Admitting: Internal Medicine

## 2012-08-10 ENCOUNTER — Other Ambulatory Visit (INDEPENDENT_AMBULATORY_CARE_PROVIDER_SITE_OTHER): Payer: Self-pay | Admitting: Internal Medicine

## 2012-08-10 MED ORDER — DEXLANSOPRAZOLE 60 MG PO CPDR: 60.0000 mg | DELAYED_RELEASE_CAPSULE | Freq: Every day | ORAL | Status: DC

## 2012-10-29 ENCOUNTER — Other Ambulatory Visit (INDEPENDENT_AMBULATORY_CARE_PROVIDER_SITE_OTHER): Payer: Self-pay | Admitting: *Deleted

## 2012-10-29 ENCOUNTER — Encounter (INDEPENDENT_AMBULATORY_CARE_PROVIDER_SITE_OTHER): Payer: Self-pay | Admitting: *Deleted

## 2012-10-29 DIAGNOSIS — K7460 Unspecified cirrhosis of liver: Secondary | ICD-10-CM

## 2012-12-03 LAB — AFP TUMOR MARKER: AFP-Tumor Marker: 1.5 ng/mL (ref 0.0–8.0)

## 2013-01-22 ENCOUNTER — Emergency Department (HOSPITAL_COMMUNITY)
Admission: EM | Admit: 2013-01-22 | Discharge: 2013-01-22 | Disposition: A | Discharge status: Home / Self Care | Payer: PRIVATE HEALTH INSURANCE | Attending: Emergency Medicine | Admitting: Emergency Medicine

## 2013-01-22 ENCOUNTER — Emergency Department (HOSPITAL_COMMUNITY): Payer: PRIVATE HEALTH INSURANCE

## 2013-01-22 ENCOUNTER — Encounter (HOSPITAL_COMMUNITY): Payer: Self-pay | Admitting: Emergency Medicine

## 2013-01-22 DIAGNOSIS — J449 Chronic obstructive pulmonary disease, unspecified: Secondary | ICD-10-CM

## 2013-01-22 DIAGNOSIS — K219 Gastro-esophageal reflux disease without esophagitis: Secondary | ICD-10-CM | POA: Insufficient documentation

## 2013-01-22 DIAGNOSIS — J441 Chronic obstructive pulmonary disease with (acute) exacerbation: Secondary | ICD-10-CM | POA: Insufficient documentation

## 2013-01-22 DIAGNOSIS — R609 Edema, unspecified: Secondary | ICD-10-CM | POA: Insufficient documentation

## 2013-01-22 DIAGNOSIS — J159 Unspecified bacterial pneumonia: Secondary | ICD-10-CM | POA: Insufficient documentation

## 2013-01-22 DIAGNOSIS — Z87891 Personal history of nicotine dependence: Secondary | ICD-10-CM | POA: Insufficient documentation

## 2013-01-22 DIAGNOSIS — I10 Essential (primary) hypertension: Secondary | ICD-10-CM | POA: Insufficient documentation

## 2013-01-22 DIAGNOSIS — Z79899 Other long term (current) drug therapy: Secondary | ICD-10-CM | POA: Insufficient documentation

## 2013-01-22 DIAGNOSIS — M129 Arthropathy, unspecified: Secondary | ICD-10-CM | POA: Insufficient documentation

## 2013-01-22 DIAGNOSIS — Z8669 Personal history of other diseases of the nervous system and sense organs: Secondary | ICD-10-CM | POA: Insufficient documentation

## 2013-01-22 DIAGNOSIS — J189 Pneumonia, unspecified organism: Secondary | ICD-10-CM

## 2013-01-22 HISTORY — DX: Chronic obstructive pulmonary disease, unspecified: J44.9

## 2013-01-22 LAB — CBC WITH DIFFERENTIAL/PLATELET
BASOS ABS: 0 10*3/uL (ref 0.0–0.1)
BASOS PCT: 0 % (ref 0–1)
Eosinophils Absolute: 0.2 10*3/uL (ref 0.0–0.7)
Eosinophils Relative: 4 % (ref 0–5)
HEMATOCRIT: 43.9 % (ref 39.0–52.0)
Hemoglobin: 14.3 g/dL (ref 13.0–17.0)
LYMPHS ABS: 1.9 10*3/uL (ref 0.7–4.0)
Lymphocytes Relative: 32 % (ref 12–46)
MCH: 29.9 pg (ref 26.0–34.0)
MCHC: 32.6 g/dL (ref 30.0–36.0)
MCV: 91.6 fL (ref 78.0–100.0)
MONO ABS: 0.8 10*3/uL (ref 0.1–1.0)
MONOS PCT: 13 % — AB (ref 3–12)
Neutro Abs: 12.7 10*3/uL — ABNORMAL HIGH (ref 1.7–7.7)
Neutrophils Relative %: 47 % (ref 43–77)
Platelets: 205 10*3/uL (ref 150–400)
RBC: 4.79 MIL/uL (ref 4.22–5.81)
RDW: 13.7 % (ref 11.5–15.5)
WBC: 6 10*3/uL (ref 4.0–10.5)

## 2013-01-22 LAB — COMPREHENSIVE METABOLIC PANEL
ALBUMIN: 3.7 g/dL (ref 3.5–5.2)
ALT: 12 U/L (ref 0–53)
AST: 22 U/L (ref 0–37)
Alkaline Phosphatase: 82 U/L (ref 39–117)
BILIRUBIN TOTAL: 0.7 mg/dL (ref 0.3–1.2)
BUN: 21 mg/dL (ref 6–23)
CHLORIDE: 103 meq/L (ref 96–112)
CO2: 25 mEq/L (ref 19–32)
CREATININE: 1.46 mg/dL — AB (ref 0.50–1.35)
Calcium: 9.7 mg/dL (ref 8.4–10.5)
GFR calc Af Amer: 50 mL/min — ABNORMAL LOW (ref >90)
GFR calc non Af Amer: 43 mL/min — ABNORMAL LOW (ref >90)
Glucose, Bld: 108 mg/dL — ABNORMAL HIGH (ref 70–99)
Potassium: 3.4 mEq/L — ABNORMAL LOW (ref 3.7–5.3)
SODIUM: 142 meq/L (ref 137–147)
Total Protein: 7.7 g/dL (ref 6.0–8.3)

## 2013-01-22 LAB — TROPONIN I: Troponin I: <0.3 ng/mL (ref <0.30)

## 2013-01-22 MED ORDER — AZITHROMYCIN 250 MG PO TABS: 500.0000 mg | ORAL_TABLET | Freq: Once | ORAL | Status: DC

## 2013-01-22 MED ORDER — AZITHROMYCIN 250 MG PO TABS
500.0000 mg | ORAL_TABLET | Freq: Once | ORAL | Status: AC
  Administered 2013-01-22: 500 mg via ORAL
  Filled 2013-01-22: qty 2

## 2013-01-22 MED ORDER — AMOXICILLIN 250 MG PO CAPS
1000.0000 mg | ORAL_CAPSULE | Freq: Once | ORAL | Status: AC
  Administered 2013-01-22: 1000 mg via ORAL
  Filled 2013-01-22: qty 4

## 2013-01-22 MED ORDER — IPRATROPIUM-ALBUTEROL 0.5-2.5 (3) MG/3ML IN SOLN
3.0000 mL | Freq: Once | RESPIRATORY_TRACT | Status: AC
Start: 2013-01-22 — End: 2013-01-22
  Administered 2013-01-22: 3 mL via RESPIRATORY_TRACT
  Filled 2013-01-22: qty 3

## 2013-01-22 MED ORDER — PREDNISONE 50 MG PO TABS
60.0000 mg | ORAL_TABLET | Freq: Once | ORAL | Status: AC
  Administered 2013-01-22: 08:00:00 60 mg via ORAL
  Filled 2013-01-22 (×2): qty 1

## 2013-01-22 MED ORDER — PREDNISONE 20 MG PO TABS: 60.0000 mg | ORAL_TABLET | Freq: Once | ORAL | Status: DC

## 2013-01-22 MED ORDER — IPRATROPIUM BROMIDE 0.02 % IN SOLN
0.5000 mg | Freq: Once | RESPIRATORY_TRACT | Status: AC
  Administered 2013-01-22: 0.5 mg via RESPIRATORY_TRACT
  Filled 2013-01-22: qty 2.5

## 2013-01-22 MED ORDER — AZITHROMYCIN 250 MG PO TABS: 250.0000 mg | ORAL_TABLET | Freq: Every day | ORAL | Status: DC

## 2013-01-22 MED ORDER — AMOXICILLIN 250 MG PO CAPS: 1000.0000 mg | ORAL_CAPSULE | Freq: Once | ORAL | Status: DC

## 2013-01-22 MED ORDER — IPRATROPIUM BROMIDE 0.02 % IN SOLN: 0.5000 mg | Freq: Once | RESPIRATORY_TRACT | Status: DC

## 2013-01-22 MED ORDER — AMOXICILLIN 500 MG PO CAPS: 1000.0000 mg | ORAL_CAPSULE | Freq: Three times a day (TID) | ORAL | Status: DC

## 2013-01-22 MED ORDER — ALBUTEROL SULFATE (2.5 MG/3ML) 0.083% IN NEBU: 5.0000 mg | INHALATION_SOLUTION | Freq: Once | RESPIRATORY_TRACT | Status: DC

## 2013-01-22 MED ORDER — ALBUTEROL SULFATE (2.5 MG/3ML) 0.083% IN NEBU
5.0000 mg | INHALATION_SOLUTION | Freq: Once | RESPIRATORY_TRACT | Status: AC
  Administered 2013-01-22: 5 mg via RESPIRATORY_TRACT
  Filled 2013-01-22: qty 6

## 2013-01-22 NOTE — ED Provider Notes (Signed)
CSN: [id number]     Arrival date & time 01/22/13  0109 History  This chart was scribed for Merryl Hacker, MD by Zettie Pho, ED Scribe. This patient was seen in room APA02/APA02 and the patient's care was started at 7:03 AM.    Chief Complaint  Patient presents with  . Shortness of Breath  . Wheezing   The history is provided by the patient. No language interpreter was used.   HPI Comments: Kurt Burke is a 80 y.o. Male with a history of COPD who presents to the Emergency Department complaining of shortness of breath with associated productive cough with clear sputum and wheezes that has been progressively worsening over the past week. He reports using his albuterol inhaler at home with temporary relief. Patient received an albuterol/ipratropium breathing treatment upon arrival to the ED and states that he does not feel short of breath at this time. Patient reports that he has been feeling very "dry," but states that he has been staying well-hydrated. He denies fever, chest pain, leg swelling. He reports allergies to codeine. Patient also has a history of HTN. Patient reports that he quit smoking in 1992.   Past Medical History  Diagnosis Date  . GERD (gastroesophageal reflux disease)   . Hypertension   . HOH (hard of hearing)   . Shortness of breath     exertion  . Arthritis   . COPD (chronic obstructive pulmonary disease)    Past Surgical History  Procedure Laterality Date  . Cystoscopy  2000    Preformed to remove cyst from liver at [place] [facility]      bilat KPE  . Colonoscopy  10/07/2011    Procedure: COLONOSCOPY;  Surgeon: Rogene Houston, MD;  Location: [facility];  Service: Endoscopy;  Laterality: N/A;  730   Family History  Problem Relation Age of Onset  . Heart disease Mother   . Heart disease Father   . Healthy Sister   . Arthritis     History  Substance Use Topics  . Smoking status: Former Smoker    Types: Cigarettes    Quit date:  10/22/1990  . Smokeless tobacco: Never Used  . Alcohol Use: No    Review of Systems  Constitutional: Negative.  Negative for fever.  Respiratory: Positive for cough, shortness of breath and wheezing. Negative for chest tightness.   Cardiovascular: Negative.  Negative for chest pain and leg swelling.  Gastrointestinal: Negative.  Negative for abdominal pain.  Genitourinary: Negative.  Negative for dysuria.  Musculoskeletal: Negative for back pain.  Skin: Negative for rash.  Neurological: Negative for headaches.  All other systems reviewed and are negative.    Allergies  Acetaminophen; Codeine; and Ibuprofen  Home Medications   Current Outpatient Rx  Name  Route  Sig  Dispense  Refill  . albuterol (PROVENTIL HFA;VENTOLIN HFA) 108 (90 BASE) MCG/ACT inhaler   Inhalation   Inhale 2 puffs into the lungs every 6 (six) hours as needed for wheezing or shortness of breath.         . benazepril-hydrochlorthiazide (LOTENSIN HCT) 20-12.5 MG per tablet   Oral   Take 2 tablets by mouth 2 (two) times daily.          . carbamide peroxide (DEBROX) 6.5 % otic solution   Both Ears   Place 5 drops into both ears 2 (two) times daily as needed (ear wax removal).          Marland Kitchen  dexlansoprazole (DEXILANT) 60 MG capsule   Oral   Take 1 capsule (60 mg total) by mouth daily.   30 capsule   6   . ENSURE (ENSURE)   Oral   Take 237 mLs by mouth daily.          . fluticasone (FLONASE) 50 MCG/ACT nasal spray   Nasal   Place 2 sprays into the nose every evening.           . meclizine (ANTIVERT) 25 MG tablet   Oral   Take 25 mg by mouth 4 (four) times daily as needed for dizziness.          . metoprolol succinate (TOPROL-XL) 50 MG 24 hr tablet   Oral   Take 50 mg by mouth daily.         Marland Kitchen OVER THE COUNTER MEDICATION   Both Eyes   Place 1 drop into both eyes every morning. Pt has a eye drop that got sample from dr. Gabriel Carina.  Pt does not recall what the name of the eye drop. Nobody  to find out the name of eye drop.         Marland Kitchen tiotropium (SPIRIVA) 18 MCG inhalation capsule   Inhalation   Place 18 mcg into inhaler and inhale daily.         Marland Kitchen ZETIA 10 MG tablet   Oral   Take 10 mg by mouth daily.         Marland Kitchen amoxicillin (AMOXIL) 500 MG capsule   Oral   Take 2 capsules (1,000 mg total) by mouth 3 (three) times daily.   42 capsule   0   . azithromycin (ZITHROMAX) 250 MG tablet   Oral   Take 1 tablet (250 mg total) by mouth daily.   4 each   0   . predniSONE (DELTASONE) 20 MG tablet   Oral   Take 3 tablets (60 mg total) by mouth once.   12 tablet   0    Triage Vitals: BP 167/102  Pulse 103  Temp(Src) 98.2 F (36.8 C) (Oral)  Resp 24  Ht 5\' 6"  (1.676 m)  Wt 173 lb (78.472 kg)  BMI 27.94 kg/m2  SpO2 95%  Physical Exam  Nursing note and vitals reviewed. Constitutional: He is oriented to person, place, and time.  Elderly  HENT:  Head: Normocephalic and atraumatic.  Mouth/Throat: Oropharynx is clear and moist.  Eyes: Pupils are equal, round, and reactive to light.  Neck: Neck supple.  Cardiovascular: Normal rate, regular rhythm and normal heart sounds.   No murmur heard. Pulmonary/Chest: Effort normal. No respiratory distress. He has wheezes.  Abdominal: Soft. Bowel sounds are normal. There is no tenderness. There is no rebound.  Musculoskeletal: He exhibits edema.  Trace BLE edema  Lymphadenopathy:    He has no cervical adenopathy.  Neurological: He is alert and oriented to person, place, and time.  Skin: Skin is warm and dry.  Psychiatric: He has a normal mood and affect.    ED Course  Procedures (including critical care time)  DIAGNOSTIC STUDIES: Oxygen Saturation is 95% on room air, adequate by my interpretation.    COORDINATION OF CARE: 7:06 AM- Will order a chest x-ray and blood labs. Discussed treatment plan with patient at bedside and patient verbalized agreement.     Labs Review Labs Reviewed  CBC WITH DIFFERENTIAL -  Abnormal; Notable for the following:    Neutro Abs 12.7 (*)    Monocytes Relative 13 (*)  All other components within normal limits  COMPREHENSIVE METABOLIC PANEL - Abnormal; Notable for the following:    Potassium 3.4 (*)    Glucose, Bld 108 (*)    Creatinine, Ser 1.46 (*)    GFR calc non Af Amer 43 (*)    GFR calc Af Amer 50 (*)    All other components within normal limits  TROPONIN I   Imaging Review Dg Chest 2 View  01/22/2013   CLINICAL DATA:  80 year old male shortness of breath and wheezing. Initial encounter.  EXAM: CHEST  2 VIEW  COMPARISON:  07/21/2012 and earlier.  FINDINGS: Mildly lower lung volumes. Chronic elevation of the right hemidiaphragm. Stable cardiac size and mediastinal contours. Tortuous thoracic aorta. No pneumothorax or pulmonary edema. No pleural effusion or consolidation. Streaky infrahilar opacity most resembles atelectasis. There is additional mild streaky right midlung opacity. No acute osseous abnormality identified.  IMPRESSION: Lower lung volumes with atelectasis.  Superimposed additional streaky right lung opacity, developing infection difficult to exclude.   Electronically Signed   By: Lars Pinks M.D.   On: 01/22/2013 07:45    EKG Interpretation    Date/Time:  Friday January 22 2013 06:44:00 EST Ventricular Rate:  99 PR Interval:    QRS Duration: 94 QT Interval:  526 QTC Calculation: 675 R Axis:   -58 Text Interpretation:  Sinus rhythm Left axis deviation Possible Anterior infarct , age undetermined Prolonged QT Abnormal ECG When compared with ECG of 21-Jul-2012 14:05, Significant changes have occurred Prolonged QT when compared to prior Confirmed by Aina Rossbach  MD, Kuper Rennels ([id number]) on 01/22/2013 7:11:37 AM            MDM   1. COPD (chronic obstructive pulmonary disease)   2. Community acquired pneumonia    Patient presents with shortness of breath and cough. History COPD. He is wheezing on exam. EKG notable for new prolonged QT. Patient is on  2 L of oxygen. Patient reports that he uses oxygen at home 2 L when needed. Workup initiated and patient given steroids and do a for presumed COPD exacerbation.  8:11 AM On recheck, patient was scanned expiratory wheezing. Second duo neb ordered. He is on his baseline home oxygen. He is in no acute distress. Chest x-ray with streaky right lung opacity which cannot rule out infection. Will give patient azithromycin and amoxicillin.  Given prolonged QT on EKG, I feel Levaquin is not the best therapy.  9:28 AM Repeat exam reassuring, patient comfortable. Patient ambulated with home oxygen without desaturation. Given reassuring exam and vital signs, will discharge home with a burst of steroids and antibiotics for presumed pneumonia. He will followup with his primary care physician in 3 days for recheck. Patient was given strict return precautions.  After history, exam, and medical workup I feel the patient has been appropriately medically screened and is safe for discharge home. Pertinent diagnoses were discussed with the patient. Patient was given return precautions.   I personally performed the services described in this documentation, which was scribed in my presence. The recorded information has been reviewed and is accurate.     Merryl Hacker, MD 01/22/13 0930

## 2013-01-22 NOTE — Discharge Instructions (Signed)
Pneumonia, Adult °Pneumonia is an infection of the lungs.  °CAUSES °Pneumonia may be caused by bacteria or a virus. Usually, these infections are caused by breathing infectious particles into the lungs (respiratory tract). °SYMPTOMS  °· Cough. °· Fever. °· Chest pain. °· Increased rate of breathing. °· Wheezing. °· Mucus production. °DIAGNOSIS  °If you have the common symptoms of pneumonia, your caregiver will typically confirm the diagnosis with a chest X-ray. The X-ray will show an abnormality in the lung (pulmonary infiltrate) if you have pneumonia. Other tests of your blood, urine, or sputum may be done to find the specific cause of your pneumonia. Your caregiver may also do tests (blood gases or pulse oximetry) to see how well your lungs are working. °TREATMENT  °Some forms of pneumonia may be spread to other people when you cough or sneeze. You may be asked to wear a mask before and during your exam. Pneumonia that is caused by bacteria is treated with antibiotic medicine. Pneumonia that is caused by the influenza virus may be treated with an antiviral medicine. Most other viral infections must run their course. These infections will not respond to antibiotics.  °PREVENTION °A pneumococcal shot (vaccine) is available to prevent a common bacterial cause of pneumonia. This is usually suggested for: °· People over 65 years old. °· Patients on chemotherapy. °· People with chronic lung problems, such as bronchitis or emphysema. °· People with immune system problems. °If you are over 65 or have a high risk condition, you may receive the pneumococcal vaccine if you have not received it before. In some countries, a routine influenza vaccine is also recommended. This vaccine can help prevent some cases of pneumonia. You may be offered the influenza vaccine as part of your care. °If you smoke, it is time to quit. You may receive instructions on how to stop smoking. Your caregiver can provide medicines and counseling to  help you quit. °HOME CARE INSTRUCTIONS  °· Cough suppressants may be used if you are losing too much rest. However, coughing protects you by clearing your lungs. You should avoid using cough suppressants if you can. °· Your caregiver may have prescribed medicine if he or she thinks your pneumonia is caused by a bacteria or influenza. Finish your medicine even if you start to feel better. °· Your caregiver may also prescribe an expectorant. This loosens the mucus to be coughed up. °· Only take over-the-counter or prescription medicines for pain, discomfort, or fever as directed by your caregiver. °· Do not smoke. Smoking is a common cause of bronchitis and can contribute to pneumonia. If you are a smoker and continue to smoke, your cough may last several weeks after your pneumonia has cleared. °· A cold steam vaporizer or humidifier in your room or home may help loosen mucus. °· Coughing is often worse at night. Sleeping in a semi-upright position in a recliner or using a couple pillows under your head will help with this. °· Get rest as you feel it is needed. Your body will usually let you know when you need to rest. °SEEK IMMEDIATE MEDICAL CARE IF:  °· Your illness becomes worse. This is especially true if you are elderly or weakened from any other disease. °· You cannot control your cough with suppressants and are losing sleep. °· You begin coughing up blood. °· You develop pain which is getting worse or is uncontrolled with medicines. °· You have a fever. °· Any of the symptoms which initially brought you in for treatment   are getting worse rather than better.  You develop shortness of breath or chest pain. MAKE SURE YOU:   Understand these instructions.  Will watch your condition.  Will get help right away if you are not doing well or get worse. Document Released: 12/24/2004 Document Revised: 03/18/2011 Document Reviewed: 03/15/2010 [facility] Patient Information 2014 [place], [place]. Chronic  Obstructive Pulmonary Disease Chronic obstructive pulmonary disease (COPD) is a common lung condition in which airflow from the lungs is limited. COPD is a general term that can be used to describe many different lung problems that limit airflow, including both chronic bronchitis and emphysema. If you have COPD, your lung function will probably never return to normal, but there are measures you can take to improve lung function and make yourself feel better.  CAUSES   Smoking (common).   Exposure to secondhand smoke.   Genetic problems.  Chronic inflammatory lung diseases or recurrent infections. SYMPTOMS   Shortness of breath, especially with physical activity.   Deep, persistent (chronic) cough with a large amount of thick mucus.   Wheezing.   Rapid breaths (tachypnea).   Gray or bluish discoloration (cyanosis) of the skin, especially in fingers, toes, or lips.   Fatigue.   Weight loss.   Frequent infections or episodes when breathing symptoms become much worse (exacerbations).   Chest tightness. DIAGNOSIS  Your healthcare provider will take a medical history and perform a physical examination to make the initial diagnosis. Additional tests for COPD may include:   Lung (pulmonary) function tests.  Chest X-ray.  CT scan.  Blood tests. TREATMENT  Treatment available to help you feel better when you have COPD include:   Inhaler and nebulizer medicines. These help manage the symptoms of COPD and make your breathing more comfortable  Supplemental oxygen. Supplemental oxygen is only helpful if you have a low oxygen level in your blood.   Exercise and physical activity. These are beneficial for nearly all people with COPD. Some people may also benefit from a pulmonary rehabilitation program. HOME CARE INSTRUCTIONS   Take all medicines (inhaled or pills) as directed by your health care provider.  Only take over-the-counter or prescription medicines for pain,  fever, or discomfort as directed by your health care provider.   Avoid over-the-counter medicines or cough syrups that dry up your airway (such as antihistamines) and slow down the elimination of secretions unless instructed otherwise by your healthcare provider.   If you are a smoker, the most important thing that you can do is stop smoking. Continuing to smoke will cause further lung damage and breathing trouble. Ask your health care provider for help with quitting smoking. He or she can direct you to community resources or hospitals that provide support.  Avoid exposure to irritants such as smoke, chemicals, and fumes that aggravate your breathing.  Use oxygen therapy and pulmonary rehabilitation if directed by your health care provider. If you require home oxygen therapy, ask your healthcare provider whether you should purchase a pulse oximeter to measure your oxygen level at home.   Avoid contact with individuals who have a contagious illness.  Avoid extreme temperature and humidity changes.  Eat healthy foods. Eating smaller, more frequent meals and resting before meals may help you maintain your strength.  Stay active, but balance activity with periods of rest. Exercise and physical activity will help you maintain your ability to do things you want to do.  Preventing infection and hospitalization is very important when you have COPD. Make sure to receive  all the vaccines your health care provider recommends, especially the pneumococcal and influenza vaccines. Ask your healthcare provider whether you need a pneumonia vaccine.  Learn and use relaxation techniques to manage stress.  Learn and use controlled breathing techniques as directed by your health care provider. Controlled breathing techniques include:   Pursed lip breathing. Start by breathing in (inhaling) through your nose for 1 second. Then, purse your lips as if you were going to whistle and breathe out (exhale) through  the pursed lips for 2 seconds.   Diaphragmatic breathing. Start by putting one hand on your abdomen just above your waist. Inhale slowly through your nose. The hand on your abdomen should move out. Then purse your lips and exhale slowly. You should be able to feel the hand on your abdomen moving in as you exhale.   Learn and use controlled coughing to clear mucus from your lungs. Controlled coughing is a series of short, progressive coughs. The steps of controlled coughing are:  1. Lean your head slightly forward.  2. Breathe in deeply using diaphragmatic breathing.  3. Try to hold your breath for 3 seconds.  4. Keep your mouth slightly open while coughing twice.  5. Spit any mucus out into a tissue.  6. Rest and repeat the steps once or twice as needed. SEEK MEDICAL CARE IF:   You are coughing up more mucus than usual.   There is a change in the color or thickness of your mucus.   Your breathing is more labored than usual.   Your breathing is faster than usual.  SEEK IMMEDIATE MEDICAL CARE IF:   You have shortness of breath while you are resting.   You have shortness of breath that prevents you from:  Being able to talk.   Performing your usual physical activities.   You have chest pain lasting longer than 5 minutes.   Your skin color is more cyanotic than usual.  You measure low oxygen saturations for longer than 5 minutes with a pulse oximeter. MAKE SURE YOU:   Understand these instructions.  Will watch your condition.  Will get help right away if you are not doing well or get worse. Document Released: 10/03/2004 Document Revised: 10/14/2012 Document Reviewed: 08/20/2012 [facility] Patient Information 2014 [place], [place].

## 2013-01-22 NOTE — ED Notes (Signed)
Pt reports increase in SOB over the past week w/ cough & wheezing. Denies any fever, vomiting or diarrhea.

## 2013-01-22 NOTE — ED Notes (Signed)
Pt ambulated in hallway on 2L of O2 without difficulty. Pt denies SOB, chest tightness, dizziness after ambulating. Dr Maureen Ralphs notified.

## 2013-03-04 ENCOUNTER — Other Ambulatory Visit (INDEPENDENT_AMBULATORY_CARE_PROVIDER_SITE_OTHER): Payer: Self-pay | Admitting: Internal Medicine

## 2013-03-10 NOTE — Telephone Encounter (Signed)
LM for patient to return the call.  

## 2013-03-26 NOTE — Telephone Encounter (Signed)
Apt has been scheduled for 07/12/13 with Dr. [name].  

## 2013-06-02 ENCOUNTER — Ambulatory Visit (HOSPITAL_COMMUNITY)
Admission: RE | Admit: 2013-06-02 | Discharge: 2013-06-02 | Disposition: A | Discharge status: Home / Self Care | Payer: PRIVATE HEALTH INSURANCE | Source: Ambulatory Visit | Attending: Internal Medicine | Admitting: Internal Medicine

## 2013-06-02 ENCOUNTER — Encounter (HOSPITAL_COMMUNITY): Payer: Self-pay | Admitting: Emergency Medicine

## 2013-06-02 ENCOUNTER — Other Ambulatory Visit (HOSPITAL_COMMUNITY): Payer: Self-pay | Admitting: Internal Medicine

## 2013-06-02 ENCOUNTER — Emergency Department (HOSPITAL_COMMUNITY)
Admission: EM | Admit: 2013-06-02 | Discharge: 2013-06-02 | Disposition: A | Discharge status: Home / Self Care | Payer: PRIVATE HEALTH INSURANCE | Attending: Emergency Medicine | Admitting: Emergency Medicine

## 2013-06-02 DIAGNOSIS — N289 Disorder of kidney and ureter, unspecified: Secondary | ICD-10-CM | POA: Insufficient documentation

## 2013-06-02 DIAGNOSIS — Z79899 Other long term (current) drug therapy: Secondary | ICD-10-CM | POA: Diagnosis not present

## 2013-06-02 DIAGNOSIS — IMO0002 Reserved for concepts with insufficient information to code with codable children: Secondary | ICD-10-CM | POA: Insufficient documentation

## 2013-06-02 DIAGNOSIS — J441 Chronic obstructive pulmonary disease with (acute) exacerbation: Secondary | ICD-10-CM | POA: Insufficient documentation

## 2013-06-02 DIAGNOSIS — K219 Gastro-esophageal reflux disease without esophagitis: Secondary | ICD-10-CM | POA: Diagnosis not present

## 2013-06-02 DIAGNOSIS — R600 Localized edema: Secondary | ICD-10-CM

## 2013-06-02 DIAGNOSIS — H9190 Unspecified hearing loss, unspecified ear: Secondary | ICD-10-CM | POA: Insufficient documentation

## 2013-06-02 DIAGNOSIS — Z87891 Personal history of nicotine dependence: Secondary | ICD-10-CM | POA: Diagnosis not present

## 2013-06-02 DIAGNOSIS — I10 Essential (primary) hypertension: Secondary | ICD-10-CM | POA: Insufficient documentation

## 2013-06-02 DIAGNOSIS — I82409 Acute embolism and thrombosis of unspecified deep veins of unspecified lower extremity: Secondary | ICD-10-CM | POA: Insufficient documentation

## 2013-06-02 DIAGNOSIS — Z8739 Personal history of other diseases of the musculoskeletal system and connective tissue: Secondary | ICD-10-CM | POA: Diagnosis not present

## 2013-06-02 LAB — COMPREHENSIVE METABOLIC PANEL
ALT: 12 U/L (ref 0–53)
AST: 21 U/L (ref 0–37)
Albumin: 3.8 g/dL (ref 3.5–5.2)
Alkaline Phosphatase: 80 U/L (ref 39–117)
BUN: 17 mg/dL (ref 6–23)
CALCIUM: 10.2 mg/dL (ref 8.4–10.5)
CO2: 31 mEq/L (ref 19–32)
Chloride: 102 mEq/L (ref 96–112)
Creatinine, Ser: 1.52 mg/dL — ABNORMAL HIGH (ref 0.50–1.35)
GFR calc Af Amer: 48 mL/min — ABNORMAL LOW (ref >90)
GFR, EST NON AFRICAN AMERICAN: 41 mL/min — AB (ref >90)
Glucose, Bld: 96 mg/dL (ref 70–99)
Potassium: 4.6 mEq/L (ref 3.7–5.3)
Sodium: 142 mEq/L (ref 137–147)
TOTAL PROTEIN: 7.7 g/dL (ref 6.0–8.3)
Total Bilirubin: 0.4 mg/dL (ref 0.3–1.2)

## 2013-06-02 LAB — CBC WITH DIFFERENTIAL/PLATELET
BASOS PCT: 0 % (ref 0–1)
Basophils Absolute: 0 10*3/uL (ref 0.0–0.1)
Eosinophils Absolute: 0.1 10*3/uL (ref 0.0–0.7)
Eosinophils Relative: 3 % (ref 0–5)
HCT: 43 % (ref 39.0–52.0)
HEMOGLOBIN: 13.8 g/dL (ref 13.0–17.0)
Lymphocytes Relative: 40 % (ref 12–46)
Lymphs Abs: 2 10*3/uL (ref 0.7–4.0)
MCH: 29.5 pg (ref 26.0–34.0)
MCHC: 32.1 g/dL (ref 30.0–36.0)
MCV: 91.9 fL (ref 78.0–100.0)
MONOS PCT: 10 % (ref 3–12)
Monocytes Absolute: 0.5 10*3/uL (ref 0.1–1.0)
NEUTROS ABS: 2.3 10*3/uL (ref 1.7–7.7)
Neutrophils Relative %: 47 % (ref 43–77)
PLATELETS: 242 10*3/uL (ref 150–400)
RBC: 4.68 MIL/uL (ref 4.22–5.81)
RDW: 13.3 % (ref 11.5–15.5)
WBC: 4.9 10*3/uL (ref 4.0–10.5)

## 2013-06-02 LAB — APTT: aPTT: 35 seconds (ref 24–37)

## 2013-06-02 LAB — PROTIME-INR
INR: 1.07 (ref 0.00–1.49)
Prothrombin Time: 13.7 seconds (ref 11.6–15.2)

## 2013-06-02 MED ORDER — XARELTO VTE STARTER PACK 15 & 20 MG PO TBPK: 15.0000 mg | ORAL_TABLET | ORAL | Status: DC

## 2013-06-02 NOTE — ED Notes (Signed)
Pt reports swelling in R lower leg for the past 1-2 weeks.  Denies pain.  Pt has [place] today and was positive for DVT.

## 2013-06-02 NOTE — ED Provider Notes (Signed)
CSN: [id number]     Arrival date & time 06/02/13  1755 History   First MD Initiated Contact with Patient 06/02/13 1759     Chief Complaint  Patient presents with  . DVT     (Consider location/radiation/quality/duration/timing/severity/associated sxs/prior Treatment) The history is provided by the patient.   patient has had some swelling in his right lower extremity for the last 2 weeks. He had a previous history of DVTs of years ago. He had been on Coumadin but finished that up around 6 months ago. No difficulty breathing within his baseline COPD. No chest pain. No trauma. He had a Doppler done today as ordered by his primary care physician that showed a DVT. Upon the results to the office was closed so he was sent to the ER. No blood in the stool. No black stool. He had been on Coumadin previously  Past Medical History  Diagnosis Date  . GERD (gastroesophageal reflux disease)   . Hypertension   . HOH (hard of hearing)   . Shortness of breath     exertion  . Arthritis   . COPD (chronic obstructive pulmonary disease)    Past Surgical History  Procedure Laterality Date  . Cystoscopy  2000    Preformed to remove cyst from liver at [facility]      bilat [facility]  . Colonoscopy  10/07/2011    Procedure: COLONOSCOPY;  Surgeon: Rogene Houston, MD;  Location: [facility];  Service: Endoscopy;  Laterality: N/A;  730   Family History  Problem Relation Age of Onset  . Heart disease Mother   . Heart disease Father   . Healthy Sister   . Arthritis     History  Substance Use Topics  . Smoking status: Former Smoker    Types: Cigarettes    Quit date: 10/22/1990  . Smokeless tobacco: Never Used  . Alcohol Use: No    Review of Systems  Constitutional: Negative for activity change and appetite change.  Eyes: Negative for pain.  Respiratory: Positive for wheezing. Negative for chest tightness and shortness of breath.   Cardiovascular: Positive for leg swelling.  Negative for chest pain.  Gastrointestinal: Negative for nausea, vomiting, abdominal pain and diarrhea.  Genitourinary: Negative for flank pain.  Musculoskeletal: Negative for back pain and neck stiffness.  Skin: Negative for rash.  Neurological: Negative for weakness, numbness and headaches.  Psychiatric/Behavioral: Negative for behavioral problems.      Allergies  Acetaminophen; Codeine; and Ibuprofen  Home Medications   Prior to Admission medications   Medication Sig Start Date End Date Taking? Authorizing Provider  albuterol (PROVENTIL HFA;VENTOLIN HFA) 108 (90 BASE) MCG/ACT inhaler Inhale 2 puffs into the lungs every 6 (six) hours as needed for wheezing or shortness of breath. 06/17/12  Yes Reyne Dumas, MD  carbamide peroxide (DEBROX) 6.5 % otic solution Place 5 drops into both ears 2 (two) times daily as needed (ear wax removal).    Yes Historical Provider, MD  dexlansoprazole (DEXILANT) 60 MG capsule Take 60 mg by mouth daily.   Yes Historical Provider, MD  ENSURE (ENSURE) Take 237 mLs by mouth daily as needed (for nutrition).    Yes Historical Provider, MD  fluticasone (FLONASE) 50 MCG/ACT nasal spray Place 2 sprays into the nose every evening.     Yes Historical Provider, MD  hydrochlorothiazide (HYDRODIURIL) 25 MG tablet Take 25 mg by mouth daily. 06/02/13  Yes Historical Provider, MD  lisinopril (PRINIVIL,ZESTRIL) 40 MG tablet Take  40 mg by mouth daily. 06/02/13  Yes Historical Provider, MD  meclizine (ANTIVERT) 25 MG tablet Take 25 mg by mouth daily.    Yes Historical Provider, MD  metoprolol succinate (TOPROL-XL) 50 MG 24 hr tablet Take 50 mg by mouth daily. 09/12/11  Yes Historical Provider, MD  tiotropium (SPIRIVA) 18 MCG inhalation capsule Place 18 mcg into inhaler and inhale every evening.  06/17/12  Yes Reyne Dumas, MD  ZETIA 10 MG tablet Take 10 mg by mouth daily. 08/26/11  Yes Historical Provider, MD   BP 147/93  Pulse 63  Temp(Src) 97.7 F (36.5 C) (Oral)  Resp 20   Ht 5\' 8"  (1.727 m)  Wt 172 lb (78.019 kg)  BMI 26.16 kg/m2  SpO2 96% Physical Exam  Constitutional: He appears well-developed and well-nourished.  Eyes: Pupils are equal, round, and reactive to light.  Cardiovascular: Normal rate and regular rhythm.   Pulmonary/Chest: Effort normal and breath sounds normal.  Abdominal: There is no tenderness.  Musculoskeletal: He exhibits edema.  Moderate pitting edema to right lower leg. Dorsalis pedis pulse intact. Sensation intact or foot. No color change. Mild edema to left lower leg.  Neurological: He is alert.    ED Course  Procedures (including critical care time) Labs Review Labs Reviewed  COMPREHENSIVE METABOLIC PANEL - Abnormal; Notable for the following:    Creatinine, Ser 1.52 (*)    GFR calc non Af Amer 41 (*)    GFR calc Af Amer 48 (*)    All other components within normal limits  CBC WITH DIFFERENTIAL  PROTIME-INR  APTT    Imaging Review US Venous Img Lower Bilateral  06/02/2013   CLINICAL DATA:  BILATERAL lower extremity edema  EXAM: BILATERAL LOWER EXTREMITY VENOUS DOPPLER ULTRASOUND  TECHNIQUE: Gray-scale sonography with graded compression, as well as color Doppler and duplex ultrasound were performed to evaluate the lower extremity deep venous systems from the level of the common femoral vein and including the common femoral, femoral, profunda femoral, popliteal and calf veins including the posterior tibial, peroneal and gastrocnemius veins when visible. The superficial great saphenous vein was also interrogated. Spectral Doppler was utilized to evaluate flow at rest and with distal augmentation maneuvers in the common femoral, femoral and popliteal veins.  COMPARISON:  None.  FINDINGS: RIGHT LOWER EXTREMITY  Common Femoral Vein: No evidence of thrombus. Normal compressibility, respiratory phasicity and response to augmentation.  Saphenofemoral Junction: No evidence of thrombus. Normal compressibility and flow on color Doppler  imaging.  Profunda Femoral Vein: No evidence of thrombus. Normal compressibility and flow on color Doppler imaging.  Femoral Vein: Hypoechoic thrombus within RIGHT femoral vein. Vein noncompressible. Minimal spontaneous venous flow.  Popliteal Vein: Hypoechoic thrombus within the RIGHT popliteal vein. Vein noncompressible. Absent spontaneous venous flow.  Calf Veins: No evidence of thrombus. Normal compressibility and flow on color Doppler imaging.  Superficial Great Saphenous Vein: No evidence of thrombus. Normal compressibility and flow on color Doppler imaging.  Venous Reflux:  None.  Other Findings:  None.  LEFT LOWER EXTREMITY  Common Femoral Vein: No evidence of thrombus. Normal compressibility, respiratory phasicity and response to augmentation.  Saphenofemoral Junction: No evidence of thrombus. Normal compressibility and flow on color Doppler imaging.  Profunda Femoral Vein: No evidence of thrombus. Normal compressibility and flow on color Doppler imaging.  Femoral Vein: No evidence of thrombus. Normal compressibility, respiratory phasicity and response to augmentation.  Popliteal Vein: No evidence of thrombus. Normal compressibility, respiratory phasicity and response to augmentation.  Calf Veins:  No evidence of thrombus. Normal compressibility and flow on color Doppler imaging.  Superficial Great Saphenous Vein: No evidence of thrombus. Normal compressibility and flow on color Doppler imaging.  Venous Reflux:  None.  Other Findings:  None.  IMPRESSION: Positive exam for presence of deep venous thrombosis within the RIGHT femoral and popliteal veins.  No evidence of deep venous thrombosis in LEFT lower extremity.   Electronically Signed   By: Lavonia Dana M.D.   On: 06/02/2013 18:11     EKG Interpretation None      MDM   Final diagnoses:  None    Patient with DVT in right leg. History of same in the past. Doubt pulmonary embolism. Hemoglobin is stable. Had previously been on Coumadin, but  patient would like to try Xarelto. Patient has mild renal insufficiency, but this does not result in dose adjustment when being dosed for pulmonary embolism or DVT. Will have follow with PCP in a few days    Kurt Burke. Alvino Chapel, MD 06/03/13 [id number]

## 2013-06-02 NOTE — ED Notes (Signed)
MD at bedside. 

## 2013-06-02 NOTE — ED Notes (Signed)
PT reports swelling to Right lower leg for 1-2 had ultrasound today and primary doctor called and was told positive for DVT and to come to ED. PT denies any pain or shortness of breath.

## 2013-06-02 NOTE — Discharge Instructions (Signed)
Deep Vein Thrombosis  A deep vein thrombosis (DVT) is a blood clot that develops in the deep, larger veins of the leg, arm, or pelvis. These are more dangerous than clots that might form in veins near the surface of the body. A DVT can lead to complications if the clot breaks off and travels in the bloodstream to the lungs.   A DVT can damage the valves in your leg veins, so that instead of flowing upward, the blood pools in the lower leg. This is called post-thrombotic syndrome, and it can result in pain, swelling, discoloration, and sores on the leg.  CAUSES  Usually, several things contribute to blood clots forming. Contributing factors include:   The flow of blood slows down.   The inside of the vein is damaged in some way.   You have a condition that makes blood clot more easily.  RISK FACTORS  Some people are more likely than others to develop blood clots. Risk factors include:    Older age, especially over 75 years of age.   Having a family history of blood clots or if you have already had a blot clot.   Having major or lengthy surgery. This is especially true for surgery on the hip, knee, or belly (abdomen). Hip surgery is particularly high risk.   Breaking a hip or leg.   Sitting or lying still for a long time. This includes long-distance travel, paralysis, or recovery from an illness or surgery.   Having cancer or cancer treatment.   Having a long, thin tube (catheter) placed inside a vein during a medical procedure.   Being overweight (obese).   Pregnancy and childbirth.   Hormone changes make the blood clot more easily during pregnancy.   The fetus puts pressure on the veins of the pelvis.   There is a risk of injury to veins during delivery or a caesarean. The risk is highest just after childbirth.   Medicines with the male hormone estrogen. This includes birth control pills and hormone replacement therapy.   Smoking.   Other circulation or heart problems.    SIGNS AND SYMPTOMS  When  a clot forms, it can either partially or totally block the blood flow in that vein. Symptoms of a DVT can include:   Swelling of the leg or arm, especially if one side is much worse.   Warmth and redness of the leg or arm, especially if one side is much worse.   Pain in an arm or leg. If the clot is in the leg, symptoms may be more noticeable or worse when standing or walking.  The symptoms of a DVT that has traveled to the lungs (pulmonary embolism, PE) usually start suddenly and include:   Shortness of breath.   Coughing.   Coughing up blood or blood-tinged phlegm.   Chest pain. The chest pain is often worse with deep breaths.   Rapid heartbeat.  Anyone with these symptoms should get emergency medical treatment right away. Call your local emergency services (911 in the [place] if you have these symptoms.  DIAGNOSIS  If a DVT is suspected, your health care provider will take a full medical history and perform a physical exam. Tests that also may be required include:   Blood tests, including studies of the clotting properties of the blood.   Ultrasonography to see if you have clots in your legs or lungs.   X-rays to show the flow of blood when dye is injected into the veins (  venography).   Studies of your lungs if you have any chest symptoms.  PREVENTION   Exercise the legs regularly. Take a brisk 30-minute walk every day.   Maintain a weight that is appropriate for your height.   Avoid sitting or lying in bed for long periods of time without moving your legs.   Women, particularly those over the age of 35 years, should consider the risks and benefits of taking estrogen medicines, including birth control pills.   Do not smoke, especially if you take estrogen medicines.   Long-distance travel can increase your risk of DVT. You should exercise your legs by walking or pumping the muscles every hour.   In-hospital prevention:   Many of the risk factors above relate to situations that exist with  hospitalization, either for illness, injury, or elective surgery.   Your health care provider will assess you for the need for venous thromboembolism prophylaxis when you are admitted to the hospital. If you are having surgery, your surgeon will assess you the day of or day after surgery.   Prevention may include medical and nonmedical measures.  TREATMENT  Once identified, a DVT can be treated. It can also be prevented in some circumstances. Once you have had a DVT, you may be at increased risk for a DVT in the future. The most common treatment for DVT is blood thinning (anticoagulant) medicine, which reduces the blood's tendency to clot. Anticoagulants can stop new blood clots from forming and stop old ones from growing. They cannot dissolve existing clots. Your body does this by itself over time. Anticoagulants can be given by mouth, by IV access, or by injection. Your health care provider will determine the best program for you. Other medicines or treatments that may be used are:   Heparin or related medicines (low molecular weight heparin) are usually the first treatment for a blood clot. They act quickly. However, they cannot be taken orally.   Heparin can cause a fall in a component of blood that stops bleeding and forms blood clots (platelets). You will be monitored with blood tests to be sure this does not occur.   Warfarin is an anticoagulant that can be swallowed. It takes a few days to start working, so usually heparin or related medicines are used in combination. Once warfarin is working, heparin is usually stopped.   Less commonly, clot dissolving drugs (thrombolytics) are used to dissolve a DVT. They carry a high risk of bleeding, so they are used mainly in severe cases, where your life or a limb is threatened.   Very rarely, a blood clot in the leg needs to be removed surgically.   If you are unable to take anticoagulants, your health care provider may arrange for you to have a filter placed  in a main vein in your abdomen. This filter prevents clots from traveling to your lungs.  HOME CARE INSTRUCTIONS   Take all medicines prescribed by your health care provider. Only take over-the-counter or prescription medicines for pain, fever, or discomfort as directed by your health care provider.   Warfarin. Most people will continue taking warfarin after hospital discharge. Your health care provider will advise you on the length of treatment (usually 3 6 months, sometimes lifelong).   Too much and too little warfarin are both dangerous. Too much warfarin increases the risk of bleeding. Too little warfarin continues to allow the risk for blood clots. While taking warfarin, you will need to have regular blood tests to measure your   blood clotting time. These blood tests usually include both the prothrombin time (PT) and international normalized ratio (INR) tests. The PT and INR results allow your health care provider to adjust your dose of warfarin. The dose can change for many reasons. It is critically important that you take warfarin exactly as prescribed, and that you have your PT and INR levels drawn exactly as directed.   Many foods, especially foods high in vitamin K, can interfere with warfarin and affect the PT and INR results. Foods high in vitamin K include spinach, kale, broccoli, cabbage, collard and turnip greens, brussel sprouts, peas, cauliflower, seaweed, and parsley as well as beef and pork liver, green tea, and soybean oil. You should eat a consistent amount of foods high in vitamin K. Avoid major changes in your diet, or notify your health care provider before changing your diet. Arrange a visit with a dietitian to answer your questions.   Many medicines can interfere with warfarin and affect the PT and INR results. You must tell your health care provider about any and all medicines you take. This includes all vitamins and supplements. Be especially cautious with aspirin and  anti-inflammatory medicines. Ask your health care provider before taking these. Do not take or discontinue any prescribed or over-the-counter medicine except on the advice of your health care provider or pharmacist.   Warfarin can have side effects, primarily excessive bruising or bleeding. You will need to hold pressure over cuts for longer than usual. Your health care provider or pharmacist will discuss other potential side effects.   Alcohol can change the body's ability to handle warfarin. It is best to avoid alcoholic drinks or consume only very small amounts while taking warfarin. Notify your health care provider if you change your alcohol intake.   Notify your dentist or other health care providers before procedures.   Activity. Ask your health care provider how soon you can go back to normal activities. It is important to stay active to prevent blood clots. If you are on anticoagulant medicine, avoid contact sports.   Exercise. It is very important to exercise. This is especially important while traveling, sitting, or standing for long periods of time. Exercise your legs by walking or by pumping the muscles frequently. Take frequent walks.   Compression stockings. These are tight elastic stockings that apply pressure to the lower legs. This pressure can help keep the blood in the legs from clotting. You may need to wear compression stockings at home to help prevent a DVT.   Do not smoke. If you smoke, quit. Ask your health care provider for help with quitting smoking.   Learn as much as you can about DVT. Knowing more about the condition should help you keep it from coming back.   Wear a medical alert bracelet or carry a medical alert card.  SEEK MEDICAL CARE IF:   You notice a rapid heartbeat.   You feel weaker or more tired than usual.   You feel faint.   You notice increased bruising.   You feel your symptoms are not getting better in the time expected.   You believe you are having side  effects of medicine.  SEEK IMMEDIATE MEDICAL CARE IF:   You have chest pain.   You have trouble breathing.   You have new or increased swelling or pain in one leg.   You cough up blood.   You notice blood in vomit, in a bowel movement, or in urine.  MAKE SURE   YOU:   Understand these instructions.   Will watch your condition.   Will get help right away if you are not doing well or get worse.  Document Released: 12/24/2004 Document Revised: 10/14/2012 Document Reviewed: 08/31/2012  [employer] Patient Information 2014 [employer]

## 2013-06-04 NOTE — Care Management Note (Signed)
Pt was here on the 27th and Dx with DVT. On call back to patient by S. Bethel, RN, pt complained that drugstore would not fill his Rx for Alen Blew and he tried to talk with Dr. Nevada Crane, his PCP but the receptionist would not let him. Spoke with patient by phone, and he states that the drugstore was unable to fill his prescription due to some insurance problem. Spoke with Event organiser at [employer] and [place] needs prior approval. Spoke with receptionist at Dr. Juel Burrow office and they are already working on the prior approval and are simply awaiting a call back from [employer]. They also told the patient to come by the office and they would give him some samples to start the medication while they wait for the approval. Called Mr. Cawley back and told him to go by Dr. Juel Burrow office for the samples, and that [place] [facility] will call him when the prescription is ready. Notified Scott at [employer], and he will call the patient when the medication is ready. Mr. Kowal was very appreciative of this assistance

## 2013-07-12 ENCOUNTER — Ambulatory Visit (INDEPENDENT_AMBULATORY_CARE_PROVIDER_SITE_OTHER): Payer: PRIVATE HEALTH INSURANCE | Admitting: Internal Medicine

## 2013-07-12 ENCOUNTER — Encounter (INDEPENDENT_AMBULATORY_CARE_PROVIDER_SITE_OTHER): Payer: Self-pay | Admitting: *Deleted

## 2013-07-12 ENCOUNTER — Encounter (INDEPENDENT_AMBULATORY_CARE_PROVIDER_SITE_OTHER): Payer: Self-pay | Admitting: Internal Medicine

## 2013-07-12 VITALS — BP 108/72 | HR 66 | Temp 97.3°F | Resp 18

## 2013-07-12 DIAGNOSIS — R1314 Dysphagia, pharyngoesophageal phase: Secondary | ICD-10-CM

## 2013-07-12 DIAGNOSIS — R1310 Dysphagia, unspecified: Secondary | ICD-10-CM

## 2013-07-12 DIAGNOSIS — R1319 Other dysphagia: Secondary | ICD-10-CM

## 2013-07-12 DIAGNOSIS — K7460 Unspecified cirrhosis of liver: Secondary | ICD-10-CM

## 2013-07-12 NOTE — Progress Notes (Signed)
Presenting complaint;  Patient complains of swallowing difficulty. History of cirrhosis..  Subjective:  Patient is an 80 year old African male who is here for scheduled visit. He was last seen in September 2013. He presents with several week history of dysphagia to solids. He states he has staying on soft foods. He has most difficulty with meats. He has no difficulty with liquids. He points to the midsternal area as site of bolus obstruction. He denies hoarseness chronic cough or sore throat. He denies nausea vomiting melena or rectal bleeding. He says his heartburn is well controlled with PPI. He states he has good appetite but he has lost 7 pounds since last visit. He was diagnosed with a DVT on 06/02/2013 when he was evaluated in the emergency room. A few years earlier he was treated for DVT and pulmonary embolism. He also complains of right shoulder pain for which she's been seen by Dr. Sharin Mons and received physical therapy but he states it has not helped him. He also complains of numbness involving fingertips of both hands.  Current Medications: Outpatient Encounter Prescriptions as of 07/12/2013  Medication Sig  . albuterol (PROVENTIL HFA;VENTOLIN HFA) 108 (90 BASE) MCG/ACT inhaler Inhale 2 puffs into the lungs every 6 (six) hours as needed for wheezing or shortness of breath.  . carbamide peroxide (DEBROX) 6.5 % otic solution Place 5 drops into both ears 2 (two) times daily as needed (ear wax removal).   Marland Kitchen dexlansoprazole (DEXILANT) 60 MG capsule Take 60 mg by mouth daily.  Marland Kitchen ENSURE (ENSURE) Take 237 mLs by mouth daily as needed (for nutrition).   . hydrochlorothiazide (HYDRODIURIL) 25 MG tablet Take 25 mg by mouth daily.  Marland Kitchen lisinopril (PRINIVIL,ZESTRIL) 40 MG tablet Take 40 mg by mouth daily.  . meclizine (ANTIVERT) 25 MG tablet Take 25 mg by mouth daily.   . metoprolol succinate (TOPROL-XL) 50 MG 24 hr tablet Take 50 mg by mouth daily.  Marland Kitchen tiotropium (SPIRIVA) 18 MCG inhalation  capsule Place 18 mcg into inhaler and inhale every evening.   Alveda Reasons STARTER PACK 15 & 20 MG TBPK Take 15-20 mg by mouth as directed. Take as directed on package: Start with one 15mg  tablet by mouth twice a day with food. On Day 22, switch to one 20mg  tablet once a day with food.  Marland Kitchen ZETIA 10 MG tablet Take 10 mg by mouth daily.  . [DISCONTINUED] fluticasone (FLONASE) 50 MCG/ACT nasal spray Place 2 sprays into the nose every evening.       Objective: Blood pressure 108/72, pulse 66, temperature 97.3 F (36.3 C), temperature source Oral, resp. rate 18. Patient is alert and does not have asterixis. Conjunctiva is pink. Sclera is nonicteric Oropharyngeal mucosa is normal. No neck masses or thyromegaly noted. Cardiac exam with regular rhythm normal S1 and S2. No murmur or gallop noted. Lungs are clear to auscultation. Abdomen is full but soft and nontender without organomegaly or masses. No LE edema or clubbing noted. He has limited abduction of right upper extremity.  Labs/studies Results: Lab data from 06/02/2013 WBC 4.9, H&H 13.8 and 43.0 and platelet count 242K INR was 1.07 Serum sodium 142, potassium 4.6, chloride 102, CO2 31, BUN 17, creatinine 1.52 and glucose 96. Bilirubin 0.4, AP 80, AST 21, ALT 12, total protein 7.7 and albumin 3.8 Serum calcium 10.2. Last ultrasound was in October 2012 revealing echogenic liver without abnormalities.      Assessment:  #1. Solid food dysphagia. He has history of ulcerative reflux esophagitis and esophageal  stricture which was last dilated 9 years ago. He may have developed recurrent esophageal stricture. 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 If he needs therapeutic EGD will plan this procedure after consultation with Dr. Wende Neighbors about bridging with Lovenox or interruption of anticoagulant for 2 days. #2. History of cirrhosis. This was diagnosed when he had liver abscess  and was treated at [facility] 13 years ago. He is due for screening.   Plan:  Barium pill esophagogram. Upper abdominal ultrasound. Office visit in 6 months.

## 2013-07-12 NOTE — Patient Instructions (Signed)
Physician will call with results of tests when completed. 

## 2013-07-16 ENCOUNTER — Other Ambulatory Visit (HOSPITAL_COMMUNITY): Payer: PRIVATE HEALTH INSURANCE

## 2013-07-16 ENCOUNTER — Ambulatory Visit (HOSPITAL_COMMUNITY): Payer: PRIVATE HEALTH INSURANCE | Attending: Internal Medicine

## 2013-08-30 ENCOUNTER — Other Ambulatory Visit (INDEPENDENT_AMBULATORY_CARE_PROVIDER_SITE_OTHER): Payer: Self-pay | Admitting: Internal Medicine

## 2014-01-10 DIAGNOSIS — I82409 Acute embolism and thrombosis of unspecified deep veins of unspecified lower extremity: Secondary | ICD-10-CM | POA: Diagnosis not present

## 2014-01-11 DIAGNOSIS — I82409 Acute embolism and thrombosis of unspecified deep veins of unspecified lower extremity: Secondary | ICD-10-CM | POA: Diagnosis not present

## 2014-01-12 DIAGNOSIS — I82409 Acute embolism and thrombosis of unspecified deep veins of unspecified lower extremity: Secondary | ICD-10-CM | POA: Diagnosis not present

## 2014-01-13 DIAGNOSIS — I82409 Acute embolism and thrombosis of unspecified deep veins of unspecified lower extremity: Secondary | ICD-10-CM | POA: Diagnosis not present

## 2014-01-14 DIAGNOSIS — I82409 Acute embolism and thrombosis of unspecified deep veins of unspecified lower extremity: Secondary | ICD-10-CM | POA: Diagnosis not present

## 2014-01-17 DIAGNOSIS — I82409 Acute embolism and thrombosis of unspecified deep veins of unspecified lower extremity: Secondary | ICD-10-CM | POA: Diagnosis not present

## 2014-01-18 ENCOUNTER — Encounter (INDEPENDENT_AMBULATORY_CARE_PROVIDER_SITE_OTHER): Payer: Self-pay | Admitting: *Deleted

## 2014-01-18 ENCOUNTER — Ambulatory Visit (INDEPENDENT_AMBULATORY_CARE_PROVIDER_SITE_OTHER): Payer: Medicare Other | Admitting: Internal Medicine

## 2014-01-18 ENCOUNTER — Encounter (INDEPENDENT_AMBULATORY_CARE_PROVIDER_SITE_OTHER): Payer: Self-pay | Admitting: Internal Medicine

## 2014-01-18 VITALS — BP 104/72 | HR 68 | Temp 97.0°F | Resp 18 | Ht 67.0 in | Wt 171.5 lb

## 2014-01-18 DIAGNOSIS — K219 Gastro-esophageal reflux disease without esophagitis: Secondary | ICD-10-CM | POA: Diagnosis not present

## 2014-01-18 DIAGNOSIS — R1310 Dysphagia, unspecified: Secondary | ICD-10-CM

## 2014-01-18 DIAGNOSIS — R1319 Other dysphagia: Secondary | ICD-10-CM

## 2014-01-18 DIAGNOSIS — K7460 Unspecified cirrhosis of liver: Secondary | ICD-10-CM

## 2014-01-18 DIAGNOSIS — R1314 Dysphagia, pharyngoesophageal phase: Secondary | ICD-10-CM | POA: Diagnosis not present

## 2014-01-18 DIAGNOSIS — I82409 Acute embolism and thrombosis of unspecified deep veins of unspecified lower extremity: Secondary | ICD-10-CM | POA: Diagnosis not present

## 2014-01-18 NOTE — Progress Notes (Signed)
Presenting complaint;  Follow-up for chronic GERD and cirrhosis. Patient complains of dysphagia.  Subjective:  Patient is 81 year old F medical male who presents for scheduled visit. He continues to complain of dysphagia which is primarily experienced with solids. He points to suprasternal area aside of bolus obstruction. He states he has no difficulty with liquids but has difficulty with 'hard foods". He has not experienced any episodes of food impaction. He denies heartburn but complains of chest congestion. He says swallowing difficulty has gotten worse since he was last seen 6 months ago. He was scheduled for barium study as well as ultrasound.he forgot in the studies were not done. He denies nausea vomiting abdominal pain melena or rectal bleeding. He does not have good appetite. He is not sure if he has lost any weight recently. Weight record from July visit is not available. He lives alone and does not drive anymore. His neighbor generally helps when he has to see his physicians or do other chores. He has not had any problems since he has been on Xarelto which was begun in May last year for right DVT.   Current Medications: Outpatient Encounter Prescriptions as of 01/18/2014  Medication Sig  . AFLURIA SUSP   . carbamide peroxide (DEBROX) 6.5 % otic solution Place 5 drops into both ears 2 (two) times daily as needed (ear wax removal).   . DEXILANT 60 MG capsule TAKE 1 CAPSULE BY MOUTH ONCE DAILY FOR ACID REFLUX.  Marland Kitchen ENSURE (ENSURE) Take 237 mLs by mouth daily as needed (for nutrition).   . Fexofenadine-Pseudoephedrine (ALLEGRA-D 24 HOUR PO) Take by mouth daily.  . fluticasone (FLONASE) 50 MCG/ACT nasal spray   . hydrochlorothiazide (HYDRODIURIL) 25 MG tablet Take 25 mg by mouth daily.  Marland Kitchen lisinopril (PRINIVIL,ZESTRIL) 40 MG tablet Take 40 mg by mouth daily.  . meclizine (ANTIVERT) 25 MG tablet Take 25 mg by mouth daily.   . metoprolol succinate (TOPROL-XL) 50 MG 24 hr tablet Take 50 mg by  mouth daily.  Marland Kitchen tiotropium (SPIRIVA) 18 MCG inhalation capsule Place 18 mcg into inhaler and inhale every evening.   Alveda Reasons STARTER PACK 15 & 20 MG TBPK Take 15-20 mg by mouth as directed. Take as directed on package: Start with one 15mg  tablet by mouth twice a day with food. On Day 22, switch to one 20mg  tablet once a day with food.  Marland Kitchen ZETIA 10 MG tablet Take 10 mg by mouth daily.  . [DISCONTINUED] albuterol (PROVENTIL HFA;VENTOLIN HFA) 108 (90 BASE) MCG/ACT inhaler Inhale 2 puffs into the lungs every 6 (six) hours as needed for wheezing or shortness of breath.     Objective: Blood pressure 104/72, pulse 68, temperature 97 F (36.1 C), temperature source Oral, resp. rate 18, height 5\' 7"  (1.702 m), weight 171 lb 8 oz (77.792 kg). Patient is alert and in no acute distress. Conjunctiva is pink. Sclera is nonicteric Oropharyngeal mucosa is normal. He has 3 remaining teeth in upper jaw and few more in lower jaw. No neck masses or thyromegaly noted. Cardiac exam with regular rhythm normal S1 and S2. No murmur or gallop noted. Lungs are clear to auscultation. Abdomen is full but soft and nontender without organomegaly or masses.  He has trace pitting edema involving the right leg.    Assessment:  #1. Solid food dysphagia. It remains to be seen if he has developed esophageal stricture or has motility disorder. #2. Chronic GERD. Heartburn is well-controlled with Dexilant. #3. Cirrhosis. This condition was diagnosed at [facility]  several years ago when he was hospitalized for liver abscess. Etiology presumed to be alcohol. He has not had any alcohol in several years. He remains with preserved hepatic function.   Plan:  Barium pill esophagogram. Upper abdominal ultrasound. AFP. Continued Dexilant 60 mg by mouth every morning for now. I will be contacting patient with results of these studies when completed. Office visit in 6 months.

## 2014-01-18 NOTE — Patient Instructions (Signed)
Physician will call with results of blood test, ultrasound and barium swallow when completed

## 2014-01-19 DIAGNOSIS — I82409 Acute embolism and thrombosis of unspecified deep veins of unspecified lower extremity: Secondary | ICD-10-CM | POA: Diagnosis not present

## 2014-01-20 DIAGNOSIS — I82409 Acute embolism and thrombosis of unspecified deep veins of unspecified lower extremity: Secondary | ICD-10-CM | POA: Diagnosis not present

## 2014-01-21 ENCOUNTER — Ambulatory Visit (HOSPITAL_COMMUNITY)
Admission: RE | Admit: 2014-01-21 | Discharge: 2014-01-21 | Disposition: A | Discharge status: Home / Self Care | Payer: Medicare Other | Source: Ambulatory Visit | Attending: Internal Medicine | Admitting: Internal Medicine

## 2014-01-21 ENCOUNTER — Other Ambulatory Visit (HOSPITAL_COMMUNITY): Payer: Medicare Other

## 2014-01-21 DIAGNOSIS — K7469 Other cirrhosis of liver: Secondary | ICD-10-CM | POA: Diagnosis not present

## 2014-01-21 DIAGNOSIS — N281 Cyst of kidney, acquired: Secondary | ICD-10-CM | POA: Insufficient documentation

## 2014-01-21 DIAGNOSIS — K224 Dyskinesia of esophagus: Secondary | ICD-10-CM | POA: Insufficient documentation

## 2014-01-21 DIAGNOSIS — I10 Essential (primary) hypertension: Secondary | ICD-10-CM | POA: Insufficient documentation

## 2014-01-21 DIAGNOSIS — K219 Gastro-esophageal reflux disease without esophagitis: Secondary | ICD-10-CM | POA: Diagnosis not present

## 2014-01-21 DIAGNOSIS — K760 Fatty (change of) liver, not elsewhere classified: Secondary | ICD-10-CM | POA: Insufficient documentation

## 2014-01-21 DIAGNOSIS — R1310 Dysphagia, unspecified: Secondary | ICD-10-CM | POA: Diagnosis not present

## 2014-01-21 DIAGNOSIS — J449 Chronic obstructive pulmonary disease, unspecified: Secondary | ICD-10-CM | POA: Insufficient documentation

## 2014-01-21 DIAGNOSIS — R1319 Other dysphagia: Secondary | ICD-10-CM | POA: Diagnosis not present

## 2014-01-21 DIAGNOSIS — K7460 Unspecified cirrhosis of liver: Secondary | ICD-10-CM | POA: Diagnosis present

## 2014-01-21 DIAGNOSIS — I82409 Acute embolism and thrombosis of unspecified deep veins of unspecified lower extremity: Secondary | ICD-10-CM | POA: Diagnosis not present

## 2014-01-23 DIAGNOSIS — J418 Mixed simple and mucopurulent chronic bronchitis: Secondary | ICD-10-CM | POA: Diagnosis not present

## 2014-01-24 DIAGNOSIS — I82409 Acute embolism and thrombosis of unspecified deep veins of unspecified lower extremity: Secondary | ICD-10-CM | POA: Diagnosis not present

## 2014-01-25 ENCOUNTER — Other Ambulatory Visit (INDEPENDENT_AMBULATORY_CARE_PROVIDER_SITE_OTHER): Payer: Self-pay | Admitting: Internal Medicine

## 2014-01-25 DIAGNOSIS — I82409 Acute embolism and thrombosis of unspecified deep veins of unspecified lower extremity: Secondary | ICD-10-CM | POA: Diagnosis not present

## 2014-01-25 DIAGNOSIS — R1310 Dysphagia, unspecified: Secondary | ICD-10-CM

## 2014-01-26 DIAGNOSIS — I82409 Acute embolism and thrombosis of unspecified deep veins of unspecified lower extremity: Secondary | ICD-10-CM | POA: Diagnosis not present

## 2014-01-27 DIAGNOSIS — I82409 Acute embolism and thrombosis of unspecified deep veins of unspecified lower extremity: Secondary | ICD-10-CM | POA: Diagnosis not present

## 2014-01-31 ENCOUNTER — Ambulatory Visit (HOSPITAL_COMMUNITY)
Admission: RE | Admit: 2014-01-31 | Discharge: 2014-01-31 | Disposition: A | Discharge status: Home / Self Care | Payer: Medicare Other | Source: Ambulatory Visit | Attending: Internal Medicine | Admitting: Internal Medicine

## 2014-01-31 ENCOUNTER — Other Ambulatory Visit (HOSPITAL_COMMUNITY): Payer: Medicare Other

## 2014-01-31 ENCOUNTER — Ambulatory Visit (HOSPITAL_COMMUNITY): Payer: Medicare Other | Admitting: Speech Pathology

## 2014-01-31 ENCOUNTER — Other Ambulatory Visit (INDEPENDENT_AMBULATORY_CARE_PROVIDER_SITE_OTHER): Payer: Self-pay | Admitting: Internal Medicine

## 2014-01-31 DIAGNOSIS — R1310 Dysphagia, unspecified: Secondary | ICD-10-CM

## 2014-01-31 DIAGNOSIS — R1314 Dysphagia, pharyngoesophageal phase: Secondary | ICD-10-CM | POA: Diagnosis not present

## 2014-01-31 NOTE — Therapy (Signed)
[place], [place], [address] Phone: [phone]   Fax:  [phone]  Modified Barium Swallow  Patient Details  Name: Kurt Burke MRN: [id number] Date of Birth: Sep 07, 1929 Referring Provider:  Delphina Cahill, MD  Encounter Date: 01/31/2014      End of Session - 01/31/14 1359    Visit Number 1   Number of Visits 1   Authorization Type [employer] Medicare   SLP Start Time 5852   SLP Stop Time  1213   SLP Time Calculation (min) 28 min   Activity Tolerance Patient tolerated treatment well      Past Medical History  Diagnosis Date  . GERD (gastroesophageal reflux disease)   . Hypertension   . HOH (hard of hearing)   . Shortness of breath     exertion  . Arthritis   . COPD (chronic obstructive pulmonary disease)     Past Surgical History  Procedure Laterality Date  . Cystoscopy  2000    Preformed to remove cyst from liver at [place]      bilat KPE  . Colonoscopy  10/07/2011    Procedure: COLONOSCOPY;  Surgeon: Rogene Houston, MD;  [facility];  Service: Endoscopy;  Laterality: N/A;  730    There were no vitals taken for this visit.  Visit Diagnosis: Dysphagia, pharyngoesophageal phase      Subjective Assessment - 01/31/14 1337    Symptoms Difficulty swallowing solid foods; penetration and aspiration of liquids on recent barium swallow   Special Tests MBSS   Currently in Pain? No/denies   Multiple Pain Sites No             General - 01/31/14 1338    General Information   Date of Onset 01/17/14   HPI Kurt Burke is an 81 year old male who presents for MBSS due to pt complaint of difficulty swallowing solids. He was referred by Dr. Laural Golden following abnormal results on recent barium swallow (Silent laryngeal penetration and aspiration of contrast extending into the trachea, mainstem bronchi and bronchus intermedius. Significant residuals within the  pharynx, hypopharynx, vallecula and piriform sinuses.Patient at high risk for aspiration pneumonia. Age-related esophageal dysmotility. No evidence of esophageal mass or stricture).  He states he has no difficulty with liquids but has difficulty with 'hard foods". He denies heartburn but complains of chest congestion. He says swallowing difficulty has gotten worse since he was last seen 6 months ago. He reports poor appetite, but is unsure if he has lost any weight. He lives alone and does not drive anymore. He has an aid come to his house for a few hours during the day and his neighbor helps with transportation. Pt with chronic GERD. Heartburn is well-controlled with Dexilant. He was diagnosed with Cirrhosis at [facility] several years ago when he was hospitalized for liver abscess. Etiology presumed to be alcohol. He has not had any alcohol in several years. He remains with preserved hepatic function.   Type of Study Modified Barium Swallowing Study   Reason for Referral Objectively evaluate swallowing function   Previous Swallow Assessment None on record; Barium swallow 01/21/2014 with aspiration of liquids and pharyngeal residue   Diet Prior to this Study Regular;Thin liquids   Temperature Spikes Noted No   Respiratory Status Room air   Behavior/Cognition Alert;Cooperative;Pleasant mood   Oral Cavity - Dentition Poor condition   Oral Motor / Sensory Function Within functional limits   Self-Feeding  Abilities Able to feed self   Patient Positioning Upright in chair   Baseline Vocal Quality Low vocal intensity;Wet  mild wetness/congestion heard   Volitional Cough Strong   Volitional Swallow Able to elicit   Anatomy Within functional limits   Pharyngeal Secretions Not observed secondary MBS            Oral Preparation/Oral Phase - 01/31/14 1349    Oral Preparation/Oral Phase   Oral Phase WFL   Electrical stimulation - Oral Phase   Was Electrical Stimulation Used No          Pharyngeal  Phase - 01/31/14 1349    Pharyngeal Phase   Pharyngeal Phase Impaired   Pharyngeal - Nectar   Pharyngeal - Nectar Cup Delayed swallow initiation;Premature spillage to valleculae;Premature spillage to pyriform sinuses;Reduced pharyngeal peristalsis;Reduced epiglottic inversion;Reduced anterior laryngeal mobility;Reduced laryngeal elevation;Reduced tongue base retraction;Penetration/Aspiration during swallow;Pharyngeal residue - valleculae;Pharyngeal residue - pyriform sinuses;Pharyngeal residue - cp segment   Penetration/Aspiration details (nectar cup) Material enters airway, remains ABOVE vocal cords then ejected out;Material does not enter airway   Pharyngeal - Nectar Straw Delayed swallow initiation;Premature spillage to valleculae;Premature spillage to pyriform sinuses;Reduced pharyngeal peristalsis;Reduced epiglottic inversion;Reduced anterior laryngeal mobility;Reduced laryngeal elevation;Reduced tongue base retraction;Penetration/Aspiration during swallow;Pharyngeal residue - valleculae;Pharyngeal residue - pyriform sinuses;Pharyngeal residue - cp segment   Penetration/Aspiration details (nectar straw) Material does not enter airway;Material enters airway, remains ABOVE vocal cords then ejected out;Material enters airway, remains ABOVE vocal cords and not ejected out   Pharyngeal - Thin   Pharyngeal - Thin Cup Delayed swallow initiation;Premature spillage to valleculae;Premature spillage to pyriform sinuses;Reduced pharyngeal peristalsis;Reduced epiglottic inversion;Reduced anterior laryngeal mobility;Reduced laryngeal elevation;Reduced airway/laryngeal closure;Reduced tongue base retraction;Penetration/Aspiration during swallow;Penetration/Aspiration after swallow;Trace aspiration;Pharyngeal residue - valleculae;Pharyngeal residue - pyriform sinuses;Lateral channel residue   Penetration/Aspiration details (thin cup) Material enters airway, remains ABOVE vocal cords then ejected out;Material enters  airway, passes BELOW cords without attempt by patient to eject out (silent aspiration);Material enters airway, CONTACTS cords then ejected out   Pharyngeal - Solids   Pharyngeal - Puree Delayed swallow initiation;Premature spillage to valleculae;Premature spillage to pyriform sinuses;Reduced pharyngeal peristalsis;Reduced epiglottic inversion;Reduced anterior laryngeal mobility;Reduced laryngeal elevation;Reduced tongue base retraction;Penetration/Aspiration during swallow;Pharyngeal residue - valleculae;Pharyngeal residue - pyriform sinuses;Pharyngeal residue - cp segment   Penetration/Aspiration details (puree) Material does not enter airway;Material enters airway, remains ABOVE vocal cords then ejected out   Pharyngeal - Mechanical Soft Delayed swallow initiation;Premature spillage to valleculae;Premature spillage to pyriform sinuses;Reduced pharyngeal peristalsis;Reduced epiglottic inversion;Reduced anterior laryngeal mobility;Reduced laryngeal elevation;Reduced tongue base retraction;Pharyngeal residue - valleculae;Pharyngeal residue - pyriform sinuses;Pharyngeal residue - cp segment   Pharyngeal - Pill Not tested   Electrical Stimulation - Pharyngeal Phase   Was Electrical Stimulation Used No          Cricopharyngeal Phase - 01/31/14 1354    Cervical Esophageal Phase   Cervical Esophageal Phase Impaired   Cervical Esophageal Phase - Solids   Puree Reduced cricopharyngeal relaxation;Prominent cricopharyngeal segment            Plan - 01/31/14 1359    Clinical Impression Statement Kurt Burke presents with a mod/severe pharyngeal phase dysphagia characterized by delay in swallow initiation (spills to pyriforms across consitencies), decrease tongue base retraction (with poor approximation to posterior pharyngeal wall), decreased epiglottic deflection and hyolaryngeal excursion, decreased airway protection (thins), and decreased relaxation of cricopharyngeus (also prominent) resulting in  trace aspiration after the swallow (from residuals) with thins (no cough response), penetration of thins, nectars, and puree, and severe vallecular and  pyriform sinus residue post swallow across consistencies. Interestingly, Kurt Burke did not appear sensate to the residuals. He was cued to implement "effortful swallow" which helped a small amount and to swallow 2-3x with each bite/sip. Pt's primary complaint was that he "feels like phlegm is always in my throat" especially when he awakens in the morning. Pt denies history of stroke; he had unremarkable head CT in 2013. Given that this has been going on for a few years and pt has not had PNA, will keep diet the same for now and have pt come in for outpatient dysphagia therapy. Results d/w with Dr. Laural Golden via phone. Recommend dysphagia therapy 2x/week for 4 weeks.   Speech Therapy Frequency 2x / week   Duration 4 weeks   Treatment/Interventions Aspiration precaution training;Pharyngeal strengthening exercises;Diet toleration management by SLP;Compensatory techniques;SLP instruction and feedback;Patient/family education;Compensatory strategies   Potential to Achieve Goals Fair   Potential Considerations Severity of impairments;Financial resources   Consulted and Agree with Plan of Care Patient          G-Codes - Feb 19, 2014 1401    Functional Assessment Tool Used MBSS; clinical judgement   Functional Limitations Swallowing   Swallow Current Status [phone]) At least 40 percent but less than 60 percent impaired, limited or restricted   Swallow Goal Status (W9798) At least 1 percent but less than 20 percent impaired, limited or restricted   Swallow Discharge Status [phone]) At least 40 percent but less than 60 percent impaired, limited or restricted          Recommendations/Treatment - 02-19-2014 1355    Swallow Evaluation Recommendations   Diet Recommendations Dysphagia 3 (Mechanical Soft);Thin liquid   Liquid Administration via Cup   Medication  Administration Crushed with puree   Supervision Patient able to self feed   Compensations Small sips/bites;Multiple dry swallows after each bite/sip;Follow solids with liquid;Effortful swallow   Postural Changes and/or Swallow Maneuvers Seated upright 90 degrees;Upright 30-60 min after meal   Oral Care Recommendations Oral care BID   Other Recommendations Clarify dietary restrictions   Follow up Recommendations Home health SLP;Outpatient SLP          Prognosis - 2014/02/19 1357    Prognosis   Prognosis for Safe Diet Advancement Guarded   Barriers to Reach Goals Severity of dysphagia   Barriers/Prognosis Comment Difficult because pt has been tolerating regular diet and thin liquids without evidence of PNA despite severity of objective findings today.   Individuals Consulted   Consulted and Agree with Results and Recommendations Patient  no family present   Report Sent to  Referring physician      Problem List Patient Active Problem List   Diagnosis Date Noted  . Altered mental status 06/16/2012  . URI (upper respiratory infection) 06/16/2012  . Fever 06/16/2012  . Rectal bleeding 09/26/2011  . Rotator cuff insufficiency 09/23/2011  . GERD (gastroesophageal reflux disease) 10/23/2010  . Cirrhosis 10/23/2010  . SHOULDER PAIN 10/17/2008  . IMPINGEMENT SYNDROME 10/17/2008  . RUPTURE ROTATOR CUFF 10/17/2008   Thank you,  Genene Churn, [place]  [facility], [place] 02/19/14, 3:06 PM  [place] [address] [place], [place], [address] Phone: [phone]   Fax:  [phone]

## 2014-02-01 DIAGNOSIS — I82409 Acute embolism and thrombosis of unspecified deep veins of unspecified lower extremity: Secondary | ICD-10-CM | POA: Diagnosis not present

## 2014-02-02 DIAGNOSIS — I82409 Acute embolism and thrombosis of unspecified deep veins of unspecified lower extremity: Secondary | ICD-10-CM | POA: Diagnosis not present

## 2014-02-03 DIAGNOSIS — I82409 Acute embolism and thrombosis of unspecified deep veins of unspecified lower extremity: Secondary | ICD-10-CM | POA: Diagnosis not present

## 2014-02-04 DIAGNOSIS — I82409 Acute embolism and thrombosis of unspecified deep veins of unspecified lower extremity: Secondary | ICD-10-CM | POA: Diagnosis not present

## 2014-02-07 DIAGNOSIS — I82409 Acute embolism and thrombosis of unspecified deep veins of unspecified lower extremity: Secondary | ICD-10-CM | POA: Diagnosis not present

## 2014-02-07 DIAGNOSIS — J418 Mixed simple and mucopurulent chronic bronchitis: Secondary | ICD-10-CM | POA: Diagnosis not present

## 2014-02-08 DIAGNOSIS — I82409 Acute embolism and thrombosis of unspecified deep veins of unspecified lower extremity: Secondary | ICD-10-CM | POA: Diagnosis not present

## 2014-02-09 DIAGNOSIS — I82409 Acute embolism and thrombosis of unspecified deep veins of unspecified lower extremity: Secondary | ICD-10-CM | POA: Diagnosis not present

## 2014-02-10 DIAGNOSIS — I82409 Acute embolism and thrombosis of unspecified deep veins of unspecified lower extremity: Secondary | ICD-10-CM | POA: Diagnosis not present

## 2014-02-11 DIAGNOSIS — I82409 Acute embolism and thrombosis of unspecified deep veins of unspecified lower extremity: Secondary | ICD-10-CM | POA: Diagnosis not present

## 2014-02-14 DIAGNOSIS — I82409 Acute embolism and thrombosis of unspecified deep veins of unspecified lower extremity: Secondary | ICD-10-CM | POA: Diagnosis not present

## 2014-02-15 DIAGNOSIS — I82409 Acute embolism and thrombosis of unspecified deep veins of unspecified lower extremity: Secondary | ICD-10-CM | POA: Diagnosis not present

## 2014-02-16 DIAGNOSIS — I82409 Acute embolism and thrombosis of unspecified deep veins of unspecified lower extremity: Secondary | ICD-10-CM | POA: Diagnosis not present

## 2014-02-18 DIAGNOSIS — I82409 Acute embolism and thrombosis of unspecified deep veins of unspecified lower extremity: Secondary | ICD-10-CM | POA: Diagnosis not present

## 2014-02-22 DIAGNOSIS — I82409 Acute embolism and thrombosis of unspecified deep veins of unspecified lower extremity: Secondary | ICD-10-CM | POA: Diagnosis not present

## 2014-02-23 ENCOUNTER — Ambulatory Visit (HOSPITAL_COMMUNITY): Payer: Medicare Other | Attending: Internal Medicine | Admitting: Speech Pathology

## 2014-02-23 DIAGNOSIS — R1314 Dysphagia, pharyngoesophageal phase: Secondary | ICD-10-CM | POA: Insufficient documentation

## 2014-02-23 DIAGNOSIS — I82409 Acute embolism and thrombosis of unspecified deep veins of unspecified lower extremity: Secondary | ICD-10-CM | POA: Diagnosis not present

## 2014-02-23 DIAGNOSIS — J418 Mixed simple and mucopurulent chronic bronchitis: Secondary | ICD-10-CM | POA: Diagnosis not present

## 2014-02-23 NOTE — Progress Notes (Signed)
Clinical/Bedside Swallow Evaluation/OUTPATIENT Patient Details  Name: Kurt Burke MRN: [id number] Date of Birth: April 01, 1929  Today's Date: 02/23/2014 Time: 10:00 AM    10:52 AM    Past Medical History:  Past Medical History  Diagnosis Date  . GERD (gastroesophageal reflux disease)   . Hypertension   . HOH (hard of hearing)   . Shortness of breath     exertion  . Arthritis   . COPD (chronic obstructive pulmonary disease)    Past Surgical History:  Past Surgical History  Procedure Laterality Date  . Cystoscopy  2000    Preformed to remove cyst from liver at [place] [facility]      bilat KPE  . Colonoscopy  10/07/2011    Procedure: COLONOSCOPY;  Surgeon: Rogene Houston, MD;  [facility]: [facility];  Service: Endoscopy;  Laterality: N/A;  730   HPI:  Kurt Burke is an 81 year old male who was seen for MBSS on 01/31/2014 due to pt complaint of difficulty swallowing solids. He was referred by Dr. Laural Golden following abnormal results on recent barium swallow (Silent laryngeal penetration and aspiration of contrast extending into the trachea, mainstem bronchi and bronchus intermedius. Significant residuals within the pharynx, hypopharynx, vallecula and piriform sinuses.Patient at high risk for aspiration pneumonia. Age-related esophageal dysmotility. No evidence of esophageal mass or stricture).  He states he has no difficulty with liquids but has difficulty with 'hard foods". He denies heartburn but complains of chest congestion. He says swallowing difficulty has gotten worse since he was last seen 6 months ago. He reports poor appetite, but is unsure if he has lost any weight. He lives alone and does not drive anymore. He has an aid come to his house for a few hours during the day and his neighbor helps with transportation. Pt with chronic GERD. Heartburn is well-controlled with Dexilant. He was diagnosed with Cirrhosis at [facility] several years ago when he was hospitalized  for liver abscess. Etiology presumed to be alcohol. He has not had any alcohol in several years. He remains with preserved hepatic function. SLP recommended dysphagia therapy following results of MBSS which showed positive penetration and aspiration and residuals in pharynx. See results below.   Assessment / Plan / Recommendation Clinical Impression    Kurt Burke presents with a documented (MBSS) mod/severe pharyngeal phase dysphagia characterized by delay in swallow initiation (spills to pyriforms across consitencies), decrease tongue base retraction (with poor approximation to posterior pharyngeal wall), decreased epiglottic deflection and hyolaryngeal excursion, decreased airway protection (thins), and decreased relaxation of cricopharyngeus (also prominent) resulting in trace aspiration after the swallow (from residuals) with thins (no cough response), penetration of thins, nectars, and puree, and severe vallecular and pyriform sinus residue post swallow across consistencies. Pt's primary complaint was that he "feels like phlegm is always in my throat" especially when he awakens in the morning. He reports that he does not go out to eat anymore because he "doesn't want to embarrass anyone" with his coughing/hacking during meals. SLP reviewed results of MBSS with Kurt Burke and explained rationale for treatment. He verbalized understanding. Three exercises were introduced today (Shaker, Masako, and tongue press) and provided in written form to practice at home. He has also been advised verbally and in written form to swallow hard, swallow 2-3x per bite/sip, follow solids with liquids, and cough/clear throat periodically during meals. Continue dysphagia therapy 1x/week for 4 weeks.     Aspiration Risk   mod/sev   Diet Recommendation Regular;Thin  liquid (self regulated regular textures)   Liquid Administration via: Cup Medication Administration: Crushed with puree Supervision: Patient able to self  feed Compensations: Small sips/bites;Multiple dry swallows after each bite/sip;Follow solids with liquid;Effortful swallow Postural Changes and/or Swallow Maneuvers: Seated upright 90 degrees;Upright 30-60 min after meal    Other  Recommendations Oral Care Recommendations: Oral care BID Other Recommendations: Clarify dietary restrictions   Follow Up Recommendations  Outpatient SLP    Frequency and Duration  1x/week for 4 weeks     Pertinent Vitals/Pain VSS    SLP Swallow Goals SLP Short Term Goals - 02/23/14 1424  SLP SHORT TERM GOAL #1  Title Pt will complete Masako exercise x10 each independently after  initial model/written cue  Baseline 3  Time 4  Period Weeks  Status New  SLP SHORT TERM GOAL #2  Title Pt will complete modified Shaker exercise x10 each independently  after initial model and written cue  Baseline x5 max cues  Time 4  Period Weeks  Status New  SLP SHORT TERM GOAL #3  Title Pt will participate in lingual press/resistive exercises with SLP  x10 each with min cues.  Baseline max assist  Time 4  Period Weeks  Status New  SLP SHORT TERM GOAL #4  Title Pt will implement safe swallow strategies during po presentations  in session independently (swallow 2-3x per bite, effortful swallow, follow  with liquids, clear throat/cough periodically)  Baseline mod cues  Time 4  Period Weeks  Status New      SLP Long Term Goals - 02/23/14 1430  SLP LONG TERM GOAL #1  Title Pt will demonstrate safe and efficient consumption of self  regulated regular textures and thin liquids with use of strategies  Baseline mod cues  Time 4  Period Weeks  Status New   Swallow Study Prior Functional Status  Cognitive/Linguistic Baseline: Information not available Type of Home: Apartment  Lives With: Alone Available Help at Discharge: Neighbor;Friend(s)    General Date of Onset: 01/17/14 HPI: Kurt Burke is an 81 year old male  who was seen for MBSS on 01/31/2014 due to pt complaint of difficulty swallowing solids. He was referred by Dr. Laural Golden following abnormal results on recent barium swallow (Silent laryngeal penetration and aspiration of contrast extending into the trachea, mainstem bronchi and bronchus intermedius. Significant residuals within the pharynx, hypopharynx, vallecula and piriform sinuses.Patient at high risk for aspiration pneumonia. Age-related esophageal dysmotility. No evidence of esophageal mass or stricture).  He states he has no difficulty with liquids but has difficulty with 'hard foods". He denies heartburn but complains of chest congestion. He says swallowing difficulty has gotten worse since he was last seen 6 months ago. He reports poor appetite, but is unsure if he has lost any weight. He lives alone and does not drive anymore. He has an aid come to his house for a few hours during the day and his neighbor helps with transportation. Pt with chronic GERD. Heartburn is well-controlled with Dexilant. He was diagnosed with Cirrhosis at [facility] several years ago when he was hospitalized for liver abscess. Etiology presumed to be alcohol. He has not had any alcohol in several years. He remains with preserved hepatic function. SLP recommended dysphagia therapy following results of MBSS which showed positive penetration and aspiration and residuals in pharynx. See results below. Type of Study: Bedside swallow evaluation Previous Swallow Assessment: MBS 01/31/2014: Diet Prior to this Study: Regular;Thin liquids Temperature Spikes Noted: No Respiratory Status: Room air History of Recent  Intubation: No Behavior/Cognition: Alert;Cooperative;Pleasant mood Oral Cavity - Dentition: Poor condition;Adequate natural dentition Self-Feeding Abilities: Able to feed self Patient Positioning: Upright in chair Baseline Vocal Quality: Low vocal intensity;Clear (mild wetness/congestion heard) Volitional Cough: Strong Volitional  Swallow: Able to elicit    Oral/Motor/Sensory Function Overall Oral Motor/Sensory Function: Appears within functional limits for tasks assessed Labial ROM: Within Functional Limits Labial Symmetry: Within Functional Limits Labial Strength: Within Functional Limits Labial Sensation: Within Functional Limits Lingual ROM: Within Functional Limits Lingual Symmetry: Within Functional Limits Lingual Strength: Reduced Lingual Sensation: Within Functional Limits Facial ROM: Within Functional Limits Facial Symmetry: Within Functional Limits Facial Strength: Within Functional Limits Facial Sensation: Within Functional Limits Velum: Within Functional Limits Mandible: Within Functional Limits   Ice Chips Ice chips: Not tested   Thin Liquid Thin Liquid: Not tested Presentation: Cup Other Comments: pt declined    Nectar Thick Nectar Thick Liquid: Not tested   Honey Thick     Puree Puree: Not tested   Solid   GO Functional Assessment Tool Used: clinical judgement Functional Limitations: Swallowing Swallow Current Status ([id number]): At least 40 percent but less than 60 percent impaired, limited or restricted Swallow Goal Status [phone]): At least 1 percent but less than 20 percent impaired, limited or restricted   Solid: Not tested      Thank you,  Genene Churn, [place] 02/23/2014,3:29 PM

## 2014-02-24 DIAGNOSIS — I82409 Acute embolism and thrombosis of unspecified deep veins of unspecified lower extremity: Secondary | ICD-10-CM | POA: Diagnosis not present

## 2014-02-25 DIAGNOSIS — I82409 Acute embolism and thrombosis of unspecified deep veins of unspecified lower extremity: Secondary | ICD-10-CM | POA: Diagnosis not present

## 2014-02-28 ENCOUNTER — Ambulatory Visit (HOSPITAL_COMMUNITY): Payer: Medicare Other | Admitting: Speech Pathology

## 2014-02-28 DIAGNOSIS — I82409 Acute embolism and thrombosis of unspecified deep veins of unspecified lower extremity: Secondary | ICD-10-CM | POA: Diagnosis not present

## 2014-02-28 DIAGNOSIS — R1314 Dysphagia, pharyngoesophageal phase: Secondary | ICD-10-CM

## 2014-02-28 NOTE — Therapy (Signed)
[place], [place], [address] Phone: [phone]   Fax:  [phone]  Speech Language Pathology Treatment-Dysphagia  Patient Details  Name: Kurt Burke MRN: [id number] Date of Birth: 1929-06-27 Referring Provider:  Rogene Houston, MD  Encounter Date: 02/28/2014      End of Session - 02/28/14 1152    Visit Number 2   Number of Visits 4   Date for SLP Re-Evaluation 03/24/14   Authorization Type UHC Medicare   Authorization Time Period 02/23/2014-03/24/2014   Authorization - Visit Number 2   Authorization - Number of Visits 4   SLP Start Time 8921   SLP Stop Time  1132   SLP Time Calculation (min) 42 min   Activity Tolerance Patient tolerated treatment well      Past Medical History  Diagnosis Date  . GERD (gastroesophageal reflux disease)   . Hypertension   . HOH (hard of hearing)   . Shortness of breath     exertion  . Arthritis   . COPD (chronic obstructive pulmonary disease)     Past Surgical History  Procedure Laterality Date  . Cystoscopy  2000    Preformed to remove cyst from liver at [facility]      bilat KPE  . Colonoscopy  10/07/2011    Procedure: COLONOSCOPY;  Surgeon: Rogene Houston, MD;  [facility];  Service: Endoscopy;  Laterality: N/A;  730    There were no vitals taken for this visit.  Visit Diagnosis: Dysphagia, pharyngoesophageal phase      Subjective Assessment - 02/28/14 1147    Symptoms Pt reports that he feels his swallowing is a little better, still complains of phlegm.   Currently in Pain? No/denies             ADULT SLP TREATMENT - 02/28/14 0001    General Information   Behavior/Cognition Alert;Cooperative;Pleasant mood   Patient Positioning Upright in chair   Oral care provided N/A   HPI [name] is an 81 year old male who was seen for MBSS on 01/31/2014 due to pt complaint of difficulty swallowing solids. He  was referred by Dr. Laural Golden following abnormal results on recent barium swallow (Silent laryngeal penetration and aspiration of contrast extending into the trachea, mainstem bronchi and bronchus intermedius. Significant residuals within the pharynx, hypopharynx, vallecula and piriform sinuses.Patient at high risk for aspiration pneumonia. Age-related esophageal dysmotility. No evidence of esophageal mass or stricture). He states he has no difficulty with liquids but has difficulty with 'hard foods". He denies heartburn but complains of chest congestion. He says swallowing difficulty has gotten worse since he was last seen 6 months ago. He reports poor appetite, but is unsure if he has lost any weight. He lives alone and does not drive anymore. He has an aid come to his house for a few hours during the day and his neighbor helps with transportation. Pt with chronic GERD. Heartburn is well-controlled with Dexilant. He was diagnosed with Cirrhosis at [facility] several years ago when he was hospitalized for liver abscess. Etiology presumed to be alcohol. He has not had any alcohol in several years. He remains with preserved hepatic function. SLP recommended dysphagia therapy following results of MBSS which showed positive penetration and aspiration and residuals in pharynx. See results below.    Treatment Provided   Treatment provided Dysphagia   Dysphagia Treatment   Temperature Spikes Noted No   Respiratory Status Room  air   Oral Cavity - Dentition Poor condition   Treatment Methods Skilled observation;Therapeutic exercise;Compensation strategy training   Patient observed directly with PO's Yes   Type of PO's observed Thin liquids   Feeding Able to feed self   Liquids provided via Cup   Pharyngeal Phase Signs & Symptoms Delayed cough   Type of cueing Verbal   Amount of cueing Moderate   Other treatment/comments Implemented Shaker exercises, masako, and lingual retraction and strengthening   Pain Assessment    Pain Assessment No/denies pain   Assessment / Recommendations / Plan   Plan Continue with current plan of care   Dysphagia Recommendations   Diet recommendations Dysphagia 3 (mechanical soft);Thin liquid   Liquids provided via Cup   Medication Administration Crushed with puree   Supervision Patient able to self feed   Compensations Multiple dry swallows after each bite/sip;Follow solids with liquid;Clear throat intermittently;Effortful swallow   Postural Changes and/or Swallow Maneuvers Out of bed for meals;Seated upright 90 degrees;Upright 30-60 min after meal   General Recommendations   Oral Care Recommendations Oral care BID;Patient independent with oral care   Progression Toward Goals   Progression toward goals Progressing toward goals            SLP Short Term Goals - 02/28/14 1200    SLP SHORT TERM GOAL #1   Title Pt will complete Masako exercise x10 each independently after initial model/written cue   Baseline 3   Time 4   Period Weeks   Status On-going   SLP SHORT TERM GOAL #2   Title Pt will complete modified Shaker exercise x10 each independently after initial model and written cue   Baseline x5 max cues   Time 4   Period Weeks   Status On-going   SLP SHORT TERM GOAL #3   Title Pt will participate in lingual press/resistive exercises with SLP x10 each with min cues.   Baseline max assist   Time 4   Period Weeks   Status On-going   SLP SHORT TERM GOAL #4   Title Pt will implement safe swallow strategies during po presentations in session independently (swallow 2-3x per bite, effortful swallow, follow with liquids, clear throat/cough periodically)   Baseline mod cues   Time 4   Period Weeks   Status On-going          SLP Long Term Goals - 02/28/14 1200    SLP LONG TERM GOAL #1   Title Pt will demonstrate safe and efficient consumption of self regulated regular textures and thin liquids with use of strategies   Baseline mod cues   Time 4   Period  Weeks   Status On-going          Plan - 02/28/14 1153    Clinical Impression Statement [name] reported that he felt a little dizzy this morning and asked me to check his blood pressure, which I did (148/98). He stated that he completed assigned swallowing exercises and that he felt that his swallowing was "already a little bit better". He stated that it hurt his back some to do the Shaker exercises, so I had him demonstrated how he was doing the exercise and provided corrective feedback. He was lifting his upper body too much. He successfully completed 10 reps of 8-10 seconds each. Other pharyngeal and lingual exercises were completed in session with mod cues from SLP. He was asked to complete all exercises 3x/day with 10 repetitions each. Next session, have pt demonstrate  all excercises without SLP assist if possible in preparation for home program after discharge from therapy.    Speech Therapy Frequency 1x /week   Duration 2 weeks   Treatment/Interventions Aspiration precaution training;Pharyngeal strengthening exercises;Diet toleration management by SLP;Compensatory techniques;SLP instruction and feedback;Patient/family education;Compensatory strategies   Potential to Achieve Goals Fair   Potential Considerations Severity of impairments;Financial resources   SLP Home Exercise Plan Pt will complete pharyngeal strengthening exercises 3x daily with written cue by report   Consulted and Agree with Plan of Care Patient        Problem List Patient Active Problem List   Diagnosis Date Noted  . Altered mental status 06/16/2012  . URI (upper respiratory infection) 06/16/2012  . Fever 06/16/2012  . Rectal bleeding 09/26/2011  . Rotator cuff insufficiency 09/23/2011  . GERD (gastroesophageal reflux disease) 10/23/2010  . Cirrhosis 10/23/2010  . SHOULDER PAIN 10/17/2008  . IMPINGEMENT SYNDROME 10/17/2008  . RUPTURE ROTATOR CUFF 10/17/2008   Thank you,  Genene Churn,  [place]  [facility] 02/28/2014, 12:02 PM  [place] [address] [place], [place], [address] Phone: [phone]   Fax:  [phone]

## 2014-03-01 DIAGNOSIS — I82409 Acute embolism and thrombosis of unspecified deep veins of unspecified lower extremity: Secondary | ICD-10-CM | POA: Diagnosis not present

## 2014-03-02 DIAGNOSIS — I82409 Acute embolism and thrombosis of unspecified deep veins of unspecified lower extremity: Secondary | ICD-10-CM | POA: Diagnosis not present

## 2014-03-03 DIAGNOSIS — I82409 Acute embolism and thrombosis of unspecified deep veins of unspecified lower extremity: Secondary | ICD-10-CM | POA: Diagnosis not present

## 2014-03-04 DIAGNOSIS — I82409 Acute embolism and thrombosis of unspecified deep veins of unspecified lower extremity: Secondary | ICD-10-CM | POA: Diagnosis not present

## 2014-03-07 DIAGNOSIS — I82409 Acute embolism and thrombosis of unspecified deep veins of unspecified lower extremity: Secondary | ICD-10-CM | POA: Diagnosis not present

## 2014-03-08 ENCOUNTER — Ambulatory Visit (HOSPITAL_COMMUNITY): Payer: Medicare Other | Attending: Internal Medicine | Admitting: Speech Pathology

## 2014-03-08 DIAGNOSIS — R1314 Dysphagia, pharyngoesophageal phase: Secondary | ICD-10-CM | POA: Insufficient documentation

## 2014-03-08 DIAGNOSIS — I82409 Acute embolism and thrombosis of unspecified deep veins of unspecified lower extremity: Secondary | ICD-10-CM | POA: Diagnosis not present

## 2014-03-08 NOTE — Therapy (Signed)
[place], [place], [address] Phone: [phone]   Fax:  [phone]  Speech Language Pathology Treatment  Patient Details  Name: Kurt Burke MRN: [id number] Date of Birth: [birth year] Referring Provider:  Rogene Houston, MD  Encounter Date: 03/08/2014      End of Session - 03/08/14 1805    Visit Number 3   Number of Visits 4   Date for SLP Re-Evaluation 03/24/14   Authorization Type UHC Medicare   Authorization Time Period 02/23/2014-03/24/2014   Authorization - Visit Number 3   Authorization - Number of Visits 4   SLP Start Time 5361   SLP Stop Time  1430   SLP Time Calculation (min) 40 min   Activity Tolerance Patient tolerated treatment well      Past Medical History  Diagnosis Date  . GERD (gastroesophageal reflux disease)   . Hypertension   . HOH (hard of hearing)   . Shortness of breath     exertion  . Arthritis   . COPD (chronic obstructive pulmonary disease)     Past Surgical History  Procedure Laterality Date  . Cystoscopy  2000    Preformed to remove cyst from liver at [place] [facility]      bilat KPE  . Colonoscopy  10/07/2011    Procedure: COLONOSCOPY;  Surgeon: Rogene Houston, MD;  [facility];  Service: Endoscopy;  Laterality: N/A;  730    There were no vitals taken for this visit.  Visit Diagnosis: Dysphagia, pharyngoesophageal phase      Subjective Assessment - 03/08/14 1801    Symptoms Pt reports increased sneezing after po intake   Currently in Pain? No/denies             ADULT SLP TREATMENT - 03/08/14 0001    General Information   Behavior/Cognition Alert;Cooperative;Pleasant mood   Patient Positioning Upright in chair   Oral care provided N/A   HPI [name] is an 81 year old male who was seen for MBSS on 01/31/2014 due to pt complaint of difficulty swallowing solids. He was referred by Dr. Laural Golden following abnormal  results on recent barium swallow (Silent laryngeal penetration and aspiration of contrast extending into the trachea, mainstem bronchi and bronchus intermedius. Significant residuals within the pharynx, hypopharynx, vallecula and piriform sinuses.Patient at high risk for aspiration pneumonia. Age-related esophageal dysmotility. No evidence of esophageal mass or stricture). He states he has no difficulty with liquids but has difficulty with 'hard foods". He denies heartburn but complains of chest congestion. He says swallowing difficulty has gotten worse since he was last seen 6 months ago. He reports poor appetite, but is unsure if he has lost any weight. He lives alone and does not drive anymore. He has an aid come to his house for a few hours during the day and his neighbor helps with transportation. Pt with chronic GERD. Heartburn is well-controlled with Dexilant. He was diagnosed with Cirrhosis at [facility] several years ago when he was hospitalized for liver abscess. Etiology presumed to be alcohol. He has not had any alcohol in several years. He remains with preserved hepatic function. SLP recommended dysphagia therapy following results of MBSS which showed positive penetration and aspiration and residuals in pharynx. See results below.    Treatment Provided   Treatment provided Dysphagia   Dysphagia Treatment   Temperature Spikes Noted No   Respiratory Status Room air   Oral Cavity - Dentition Poor  condition   Treatment Methods Skilled observation;Therapeutic exercise;Compensation strategy training   Patient observed directly with PO's Yes   Type of PO's observed Thin liquids;Nectar-thick liquids  ice cream   Feeding Able to feed self   Liquids provided via Cup   Pharyngeal Phase Signs & Symptoms Delayed throat clear;Delayed cough;Watery eyes   Type of cueing Verbal   Amount of cueing Moderate   Pain Assessment   Pain Assessment No/denies pain   Assessment / Recommendations / Plan   Plan  Continue with current plan of care   Progression Toward Goals   Progression toward goals Progressing toward goals            SLP Short Term Goals - 03/08/14 1811    SLP SHORT TERM GOAL #1   Title Pt will complete Masako exercise x10 each independently after initial model/written cue   Baseline 3   Time 4   Period Weeks   Status On-going   SLP SHORT TERM GOAL #2   Title Pt will complete modified Shaker exercise x10 each independently after initial model and written cue   Baseline x5 max cues   Time 4   Period Weeks   Status On-going   SLP SHORT TERM GOAL #3   Title Pt will participate in lingual press/resistive exercises with SLP x10 each with min cues.   Baseline max assist   Time 4   Period Weeks   Status On-going   SLP SHORT TERM GOAL #4   Title Pt will implement safe swallow strategies during po presentations in session independently (swallow 2-3x per bite, effortful swallow, follow with liquids, clear throat/cough periodically)   Baseline mod cues   Time 4   Period Weeks   Status On-going          SLP Long Term Goals - 03/08/14 1811    SLP LONG TERM GOAL #1   Title Pt will demonstrate safe and efficient consumption of self regulated regular textures and thin liquids with use of strategies   Baseline mod cues   Time 4   Period Weeks   Status On-going          Plan - 03/08/14 1805    Clinical Impression Statement [name] reports that he is "ok", but noted increased sneezing and phlegm yesterday. He states that he has been completing pharyngeal strengthening exercises, but required mild/mod cues to demonstrate in session. All exercises were completed in session with good tolerance. Overall, pt feels like he's "doing better". He was presented with thin water via cup sips and ice cream via teaspoon presentation. Pt with delayed cough after ice cream (pt was not given any cues). When he was cued to "swallow hard" and repeat swallow 2-3x, repeated trials were  better. Next session is our last session and all exercises will be reviewed and summary sent to Dr. Laural Golden. Pt will need to be monitored for increasing congestion due to high risk for aspiration. Continue next session.    Speech Therapy Frequency 1x /week   Duration 1 week   Treatment/Interventions Aspiration precaution training;Pharyngeal strengthening exercises;Diet toleration management by SLP;Compensatory techniques;SLP instruction and feedback;Patient/family education;Compensatory strategies   Potential to Achieve Goals Fair   Potential Considerations Severity of impairments;Financial resources   SLP Home Exercise Plan Pt will complete pharyngeal strengthening exercises 3x daily with written cue by report   Consulted and Agree with Plan of Care Patient        Problem List Patient Active Problem List   Diagnosis  Date Noted  . Altered mental status 06/16/2012  . URI (upper respiratory infection) 06/16/2012  . Fever 06/16/2012  . Rectal bleeding 09/26/2011  . Rotator cuff insufficiency 09/23/2011  . GERD (gastroesophageal reflux disease) 10/23/2010  . Cirrhosis 10/23/2010  . SHOULDER PAIN 10/17/2008  . IMPINGEMENT SYNDROME 10/17/2008  . RUPTURE ROTATOR CUFF 10/17/2008   Thank you,  Genene Churn, [place]  Genene Churn 03/08/2014, [place] [address] [place], [place], [address] Phone: [phone]   Fax:  [phone]

## 2014-03-09 DIAGNOSIS — I82409 Acute embolism and thrombosis of unspecified deep veins of unspecified lower extremity: Secondary | ICD-10-CM | POA: Diagnosis not present

## 2014-03-10 DIAGNOSIS — I82409 Acute embolism and thrombosis of unspecified deep veins of unspecified lower extremity: Secondary | ICD-10-CM | POA: Diagnosis not present

## 2014-03-11 DIAGNOSIS — I82409 Acute embolism and thrombosis of unspecified deep veins of unspecified lower extremity: Secondary | ICD-10-CM | POA: Diagnosis not present

## 2014-03-14 DIAGNOSIS — I82409 Acute embolism and thrombosis of unspecified deep veins of unspecified lower extremity: Secondary | ICD-10-CM | POA: Diagnosis not present

## 2014-03-15 ENCOUNTER — Ambulatory Visit (HOSPITAL_COMMUNITY): Payer: Medicare Other | Admitting: Speech Pathology

## 2014-03-15 DIAGNOSIS — I82409 Acute embolism and thrombosis of unspecified deep veins of unspecified lower extremity: Secondary | ICD-10-CM | POA: Diagnosis not present

## 2014-03-15 DIAGNOSIS — R1314 Dysphagia, pharyngoesophageal phase: Secondary | ICD-10-CM

## 2014-03-15 NOTE — Therapy (Signed)
[place]urt Burke, [place], [address] Phone: [phone]   Fax:  [phone]  Speech Language Pathology Treatment  Patient Details  Name: Kurt Burke MRN: [id number] Date of Birth: 1929-01-27 Referring Provider:  Rogene Houston, MD  Encounter Date: 03/15/2014      End of Session - 03/15/14 2058    Visit Number 4   Number of Visits 4   Date for SLP Re-Evaluation 03/24/14   Authorization Type UHC Medicare   Authorization Time Period 02/23/2014-03/24/2014   Authorization - Visit Number 4   Authorization - Number of Visits 4   SLP Start Time 0350   SLP Stop Time  0938   SLP Time Calculation (min) 44 min   Activity Tolerance Patient tolerated treatment well      Past Medical History  Diagnosis Date  . GERD (gastroesophageal reflux disease)   . Hypertension   . HOH (hard of hearing)   . Shortness of breath     exertion  . Arthritis   . COPD (chronic obstructive pulmonary disease)     Past Surgical History  Procedure Laterality Date  . Cystoscopy  2000    Preformed to remove cyst from liver at [place] [facility]      bilat KPE  . Colonoscopy  10/07/2011    Procedure: COLONOSCOPY;  Surgeon: Rogene Houston, MD;  [facility];  Service: Endoscopy;  Laterality: N/A;  730    There were no vitals taken for this visit.  Visit Diagnosis: Dysphagia, pharyngoesophageal phase      Subjective Assessment - 03/15/14 2056    Symptoms "I think I am doing a little better."   Currently in Pain? No/denies             ADULT SLP TREATMENT - 03/15/14 2056    General Information   Behavior/Cognition Alert;Cooperative;Pleasant mood   Patient Positioning Upright in chair   Oral care provided N/A   HPI Kurt Burke is an 81 year old male who was seen for MBSS on 01/31/2014 due to pt complaint of difficulty swallowing solids. He was referred by Dr. Laural Golden following abnormal results on  recent barium swallow (Silent laryngeal penetration and aspiration of contrast extending into the trachea, mainstem bronchi and bronchus intermedius. Significant residuals within the pharynx, hypopharynx, vallecula and piriform sinuses.Patient at high risk for aspiration pneumonia. Age-related esophageal dysmotility. No evidence of esophageal mass or stricture). He states he has no difficulty with liquids but has difficulty with 'hard foods". He denies heartburn but complains of chest congestion. He says swallowing difficulty has gotten worse since he was last seen 6 months ago. He reports poor appetite, but is unsure if he has lost any weight. He lives alone and does not drive anymore. He has an aid come to his house for a few hours during the day and his neighbor helps with transportation. Pt with chronic GERD. Heartburn is well-controlled with Dexilant. He was diagnosed with Cirrhosis at [facility] several years ago when he was hospitalized for liver abscess. Etiology presumed to be alcohol. He has not had any alcohol in several years. He remains with preserved hepatic function. SLP recommended dysphagia therapy following results of MBSS which showed positive penetration and aspiration and residuals in pharynx. See results below.    Treatment Provided   Treatment provided Dysphagia   Dysphagia Treatment   Temperature Spikes Noted No   Respiratory Status Room air   Oral Cavity - Dentition  Poor condition   Treatment Methods Skilled observation;Therapeutic exercise;Compensation strategy training   Patient observed directly with PO's Yes   Type of PO's observed Thin liquids   Feeding Able to feed self   Liquids provided via Cup   Pharyngeal Phase Signs & Symptoms Delayed cough;Watery eyes   Type of cueing Verbal   Amount of cueing Minimal   Pain Assessment   Pain Assessment No/denies pain   Assessment / Recommendations / Plan   Plan Discharge SLP treatment due to (comment)   Dysphagia Recommendations    Diet recommendations Dysphagia 3 (mechanical soft);Thin liquid   Liquids provided via Cup   Medication Administration Crushed with puree   Supervision Patient able to self feed   Compensations Multiple dry swallows after each bite/sip;Follow solids with liquid;Clear throat intermittently;Effortful swallow   Postural Changes and/or Swallow Maneuvers Out of bed for meals;Seated upright 90 degrees;Upright 30-60 min after meal   Progression Toward Goals   Progression toward goals Goals met, education completed, patient discharged from [place] - 03/15/14 2100    SLP SHORT TERM GOAL #1   Title Pt will complete Masako exercise x10 each independently after initial model/written cue   Baseline 3   Time 4   Period Weeks   Status Achieved   SLP SHORT TERM GOAL #2   Title Pt will complete modified Shaker exercise x10 each independently after initial model and written cue   Baseline x5 max cues   Time 4   Period Weeks   Status Achieved   SLP SHORT TERM GOAL #3   Title Pt will participate in lingual press/resistive exercises with SLP x10 each with min cues.   Baseline max assist   Time 4   Period Weeks   Status Achieved   SLP SHORT TERM GOAL #4   Title Pt will implement safe swallow strategies during po presentations in session independently (swallow 2-3x per bite, effortful swallow, follow with liquids, clear throat/cough periodically)   Baseline mod cues   Time 4   Period Weeks   Status Achieved          SLP Long Term Goals - 03/15/14 2100    SLP LONG TERM GOAL #1   Title Pt will demonstrate safe and efficient consumption of self regulated regular textures and thin liquids with use of strategies   Baseline mod cues   Time 4   Period Weeks   Status Achieved          Plan - 03/15/14 2058    Clinical Impression Statement Mr. Madlock demonstrated swallowing exercises with use of written material for reference. He states that he continues to do  these exercises at home and thinks "it is a little bit better". He still reports waking with increased congestion/phlegm that is difficult to clear despite implementation of reflux strategies (elevated HOB). He ate a small piece of chocolate before bed last night and and felt that it irritated his stomach. He was encouraged to consume earlier in the day. Given the severity of his dysphagia, he will need to be monitored going forward as he is at high risk for aspiration. Pt acknowledges this, but I do not feel he truly appreciates this. When asked if he would want a feeding tube should he become unable to swallow, he stated, "I think so". He was encouraged to discuss with his physician. He stated that he had "gone over all that" with  his niece, Elmo Putt (his sister's daughter in [place]).    Treatment/Interventions Aspiration precaution training;Pharyngeal strengthening exercises;Diet toleration management by SLP;Compensatory techniques;SLP instruction and feedback;Patient/family education;Compensatory strategies   Potential to Achieve Goals Fair   Potential Considerations Severity of impairments;Financial resources   SLP Home Exercise Plan Pt will complete pharyngeal strengthening exercises 3x daily with written cue by report   Consulted and Agree with Plan of Care Patient          G-Codes - 04/06/14 2099-03-06    Functional Assessment Tool Used clinical judgement   Functional Limitations Swallowing   Swallow Goal Status (R4935) At least 1 percent but less than 20 percent impaired, limited or restricted   Swallow Discharge Status [phone]) At least 20 percent but less than 40 percent impaired, limited or restricted      Problem List Patient Active Problem List   Diagnosis Date Noted  . Altered mental status 06/16/2012  . URI (upper respiratory infection) 06/16/2012  . Fever 06/16/2012  . Rectal bleeding 09/26/2011  . Rotator cuff insufficiency 09/23/2011  . GERD (gastroesophageal reflux disease)  10/23/2010  . Cirrhosis 10/23/2010  . SHOULDER PAIN 10/17/2008  . IMPINGEMENT SYNDROME 10/17/2008  . RUPTURE ROTATOR CUFF 10/17/2008   SPEECH THERAPY DISCHARGE SUMMARY  Visits from Start of Care: 4  Current functional level related to goals / functional outcomes: Mr. Grandison is able to consume a mechanical soft diet with thin liquids with use of strategies. He is independent with HEP.   Remaining deficits: Mr. Monteforte will continue to be at risk for aspiration given the severity of his pharyngeal phase dysphagia, however with continued exercises and use of strategies at home, his risks can be mitigated but not prevent.    Education / Equipment: Home exercises program in written form.  Plan: Patient agrees to discharge.  Patient goals were met. Patient is being discharged due to meeting the stated rehab goals.  ?????       Thank you,  Genene Churn, [place]  [facility] 04/06/14, 9:02 PM  [place] [address] [place], [place], [address] Phone: [phone]   Fax:  [phone]

## 2014-03-16 DIAGNOSIS — I82409 Acute embolism and thrombosis of unspecified deep veins of unspecified lower extremity: Secondary | ICD-10-CM | POA: Diagnosis not present

## 2014-03-17 DIAGNOSIS — I82409 Acute embolism and thrombosis of unspecified deep veins of unspecified lower extremity: Secondary | ICD-10-CM | POA: Diagnosis not present

## 2014-03-18 DIAGNOSIS — I82409 Acute embolism and thrombosis of unspecified deep veins of unspecified lower extremity: Secondary | ICD-10-CM | POA: Diagnosis not present

## 2014-03-21 DIAGNOSIS — I82409 Acute embolism and thrombosis of unspecified deep veins of unspecified lower extremity: Secondary | ICD-10-CM | POA: Diagnosis not present

## 2014-03-22 DIAGNOSIS — I82409 Acute embolism and thrombosis of unspecified deep veins of unspecified lower extremity: Secondary | ICD-10-CM | POA: Diagnosis not present

## 2014-03-23 DIAGNOSIS — I82409 Acute embolism and thrombosis of unspecified deep veins of unspecified lower extremity: Secondary | ICD-10-CM | POA: Diagnosis not present

## 2014-03-24 DIAGNOSIS — I82409 Acute embolism and thrombosis of unspecified deep veins of unspecified lower extremity: Secondary | ICD-10-CM | POA: Diagnosis not present

## 2014-03-24 DIAGNOSIS — J418 Mixed simple and mucopurulent chronic bronchitis: Secondary | ICD-10-CM | POA: Diagnosis not present

## 2014-03-25 DIAGNOSIS — I82409 Acute embolism and thrombosis of unspecified deep veins of unspecified lower extremity: Secondary | ICD-10-CM | POA: Diagnosis not present

## 2014-03-28 DIAGNOSIS — I82409 Acute embolism and thrombosis of unspecified deep veins of unspecified lower extremity: Secondary | ICD-10-CM | POA: Diagnosis not present

## 2014-03-29 DIAGNOSIS — I82409 Acute embolism and thrombosis of unspecified deep veins of unspecified lower extremity: Secondary | ICD-10-CM | POA: Diagnosis not present

## 2014-03-29 DIAGNOSIS — E782 Mixed hyperlipidemia: Secondary | ICD-10-CM | POA: Diagnosis not present

## 2014-03-30 DIAGNOSIS — I82409 Acute embolism and thrombosis of unspecified deep veins of unspecified lower extremity: Secondary | ICD-10-CM | POA: Diagnosis not present

## 2014-03-31 DIAGNOSIS — D509 Iron deficiency anemia, unspecified: Secondary | ICD-10-CM | POA: Diagnosis not present

## 2014-03-31 DIAGNOSIS — J449 Chronic obstructive pulmonary disease, unspecified: Secondary | ICD-10-CM | POA: Diagnosis not present

## 2014-03-31 DIAGNOSIS — I10 Essential (primary) hypertension: Secondary | ICD-10-CM | POA: Diagnosis not present

## 2014-03-31 DIAGNOSIS — N183 Chronic kidney disease, stage 3 (moderate): Secondary | ICD-10-CM | POA: Diagnosis not present

## 2014-04-01 DIAGNOSIS — I82409 Acute embolism and thrombosis of unspecified deep veins of unspecified lower extremity: Secondary | ICD-10-CM | POA: Diagnosis not present

## 2014-04-05 DIAGNOSIS — I82409 Acute embolism and thrombosis of unspecified deep veins of unspecified lower extremity: Secondary | ICD-10-CM | POA: Diagnosis not present

## 2014-04-06 DIAGNOSIS — I82409 Acute embolism and thrombosis of unspecified deep veins of unspecified lower extremity: Secondary | ICD-10-CM | POA: Diagnosis not present

## 2014-04-07 DIAGNOSIS — I82409 Acute embolism and thrombosis of unspecified deep veins of unspecified lower extremity: Secondary | ICD-10-CM | POA: Diagnosis not present

## 2014-04-08 DIAGNOSIS — I82409 Acute embolism and thrombosis of unspecified deep veins of unspecified lower extremity: Secondary | ICD-10-CM | POA: Diagnosis not present

## 2014-04-11 DIAGNOSIS — I82409 Acute embolism and thrombosis of unspecified deep veins of unspecified lower extremity: Secondary | ICD-10-CM | POA: Diagnosis not present

## 2014-04-12 DIAGNOSIS — I82409 Acute embolism and thrombosis of unspecified deep veins of unspecified lower extremity: Secondary | ICD-10-CM | POA: Diagnosis not present

## 2014-04-13 DIAGNOSIS — I82409 Acute embolism and thrombosis of unspecified deep veins of unspecified lower extremity: Secondary | ICD-10-CM | POA: Diagnosis not present

## 2014-04-14 DIAGNOSIS — I82409 Acute embolism and thrombosis of unspecified deep veins of unspecified lower extremity: Secondary | ICD-10-CM | POA: Diagnosis not present

## 2014-04-15 DIAGNOSIS — I82409 Acute embolism and thrombosis of unspecified deep veins of unspecified lower extremity: Secondary | ICD-10-CM | POA: Diagnosis not present

## 2014-04-18 DIAGNOSIS — I82409 Acute embolism and thrombosis of unspecified deep veins of unspecified lower extremity: Secondary | ICD-10-CM | POA: Diagnosis not present

## 2014-04-19 DIAGNOSIS — I82409 Acute embolism and thrombosis of unspecified deep veins of unspecified lower extremity: Secondary | ICD-10-CM | POA: Diagnosis not present

## 2014-04-20 DIAGNOSIS — I82409 Acute embolism and thrombosis of unspecified deep veins of unspecified lower extremity: Secondary | ICD-10-CM | POA: Diagnosis not present

## 2014-04-21 DIAGNOSIS — I82409 Acute embolism and thrombosis of unspecified deep veins of unspecified lower extremity: Secondary | ICD-10-CM | POA: Diagnosis not present

## 2014-04-22 DIAGNOSIS — I82409 Acute embolism and thrombosis of unspecified deep veins of unspecified lower extremity: Secondary | ICD-10-CM | POA: Diagnosis not present

## 2014-04-24 DIAGNOSIS — J418 Mixed simple and mucopurulent chronic bronchitis: Secondary | ICD-10-CM | POA: Diagnosis not present

## 2014-04-25 DIAGNOSIS — I82409 Acute embolism and thrombosis of unspecified deep veins of unspecified lower extremity: Secondary | ICD-10-CM | POA: Diagnosis not present

## 2014-04-26 DIAGNOSIS — I82409 Acute embolism and thrombosis of unspecified deep veins of unspecified lower extremity: Secondary | ICD-10-CM | POA: Diagnosis not present

## 2014-04-27 DIAGNOSIS — I82409 Acute embolism and thrombosis of unspecified deep veins of unspecified lower extremity: Secondary | ICD-10-CM | POA: Diagnosis not present

## 2014-04-28 DIAGNOSIS — I82409 Acute embolism and thrombosis of unspecified deep veins of unspecified lower extremity: Secondary | ICD-10-CM | POA: Diagnosis not present

## 2014-04-29 DIAGNOSIS — I82409 Acute embolism and thrombosis of unspecified deep veins of unspecified lower extremity: Secondary | ICD-10-CM | POA: Diagnosis not present

## 2014-05-02 DIAGNOSIS — I82409 Acute embolism and thrombosis of unspecified deep veins of unspecified lower extremity: Secondary | ICD-10-CM | POA: Diagnosis not present

## 2014-05-03 DIAGNOSIS — I82409 Acute embolism and thrombosis of unspecified deep veins of unspecified lower extremity: Secondary | ICD-10-CM | POA: Diagnosis not present

## 2014-05-04 DIAGNOSIS — I82409 Acute embolism and thrombosis of unspecified deep veins of unspecified lower extremity: Secondary | ICD-10-CM | POA: Diagnosis not present

## 2014-05-05 DIAGNOSIS — I82409 Acute embolism and thrombosis of unspecified deep veins of unspecified lower extremity: Secondary | ICD-10-CM | POA: Diagnosis not present

## 2014-05-06 DIAGNOSIS — I82409 Acute embolism and thrombosis of unspecified deep veins of unspecified lower extremity: Secondary | ICD-10-CM | POA: Diagnosis not present

## 2014-05-09 DIAGNOSIS — I82409 Acute embolism and thrombosis of unspecified deep veins of unspecified lower extremity: Secondary | ICD-10-CM | POA: Diagnosis not present

## 2014-05-10 DIAGNOSIS — I82409 Acute embolism and thrombosis of unspecified deep veins of unspecified lower extremity: Secondary | ICD-10-CM | POA: Diagnosis not present

## 2014-05-11 DIAGNOSIS — I82409 Acute embolism and thrombosis of unspecified deep veins of unspecified lower extremity: Secondary | ICD-10-CM | POA: Diagnosis not present

## 2014-05-13 DIAGNOSIS — I82409 Acute embolism and thrombosis of unspecified deep veins of unspecified lower extremity: Secondary | ICD-10-CM | POA: Diagnosis not present

## 2014-05-16 DIAGNOSIS — I82409 Acute embolism and thrombosis of unspecified deep veins of unspecified lower extremity: Secondary | ICD-10-CM | POA: Diagnosis not present

## 2014-05-17 DIAGNOSIS — I82409 Acute embolism and thrombosis of unspecified deep veins of unspecified lower extremity: Secondary | ICD-10-CM | POA: Diagnosis not present

## 2014-05-18 DIAGNOSIS — I82409 Acute embolism and thrombosis of unspecified deep veins of unspecified lower extremity: Secondary | ICD-10-CM | POA: Diagnosis not present

## 2014-05-19 DIAGNOSIS — I82409 Acute embolism and thrombosis of unspecified deep veins of unspecified lower extremity: Secondary | ICD-10-CM | POA: Diagnosis not present

## 2014-05-20 DIAGNOSIS — I82409 Acute embolism and thrombosis of unspecified deep veins of unspecified lower extremity: Secondary | ICD-10-CM | POA: Diagnosis not present

## 2014-05-23 DIAGNOSIS — I82409 Acute embolism and thrombosis of unspecified deep veins of unspecified lower extremity: Secondary | ICD-10-CM | POA: Diagnosis not present

## 2014-05-24 DIAGNOSIS — J418 Mixed simple and mucopurulent chronic bronchitis: Secondary | ICD-10-CM | POA: Diagnosis not present

## 2014-05-24 DIAGNOSIS — I82409 Acute embolism and thrombosis of unspecified deep veins of unspecified lower extremity: Secondary | ICD-10-CM | POA: Diagnosis not present

## 2014-05-25 DIAGNOSIS — I82409 Acute embolism and thrombosis of unspecified deep veins of unspecified lower extremity: Secondary | ICD-10-CM | POA: Diagnosis not present

## 2014-05-26 DIAGNOSIS — I82409 Acute embolism and thrombosis of unspecified deep veins of unspecified lower extremity: Secondary | ICD-10-CM | POA: Diagnosis not present

## 2014-05-27 DIAGNOSIS — I82409 Acute embolism and thrombosis of unspecified deep veins of unspecified lower extremity: Secondary | ICD-10-CM | POA: Diagnosis not present

## 2014-05-30 DIAGNOSIS — I82409 Acute embolism and thrombosis of unspecified deep veins of unspecified lower extremity: Secondary | ICD-10-CM | POA: Diagnosis not present

## 2014-05-31 DIAGNOSIS — I82409 Acute embolism and thrombosis of unspecified deep veins of unspecified lower extremity: Secondary | ICD-10-CM | POA: Diagnosis not present

## 2014-06-01 DIAGNOSIS — I82409 Acute embolism and thrombosis of unspecified deep veins of unspecified lower extremity: Secondary | ICD-10-CM | POA: Diagnosis not present

## 2014-06-02 DIAGNOSIS — I82409 Acute embolism and thrombosis of unspecified deep veins of unspecified lower extremity: Secondary | ICD-10-CM | POA: Diagnosis not present

## 2014-06-03 DIAGNOSIS — I82409 Acute embolism and thrombosis of unspecified deep veins of unspecified lower extremity: Secondary | ICD-10-CM | POA: Diagnosis not present

## 2014-06-06 DIAGNOSIS — I82409 Acute embolism and thrombosis of unspecified deep veins of unspecified lower extremity: Secondary | ICD-10-CM | POA: Diagnosis not present

## 2014-06-07 DIAGNOSIS — I82409 Acute embolism and thrombosis of unspecified deep veins of unspecified lower extremity: Secondary | ICD-10-CM | POA: Diagnosis not present

## 2014-06-08 DIAGNOSIS — I82409 Acute embolism and thrombosis of unspecified deep veins of unspecified lower extremity: Secondary | ICD-10-CM | POA: Diagnosis not present

## 2014-06-09 DIAGNOSIS — I82409 Acute embolism and thrombosis of unspecified deep veins of unspecified lower extremity: Secondary | ICD-10-CM | POA: Diagnosis not present

## 2014-06-10 DIAGNOSIS — I82409 Acute embolism and thrombosis of unspecified deep veins of unspecified lower extremity: Secondary | ICD-10-CM | POA: Diagnosis not present

## 2014-06-13 DIAGNOSIS — I82409 Acute embolism and thrombosis of unspecified deep veins of unspecified lower extremity: Secondary | ICD-10-CM | POA: Diagnosis not present

## 2014-06-14 DIAGNOSIS — I82409 Acute embolism and thrombosis of unspecified deep veins of unspecified lower extremity: Secondary | ICD-10-CM | POA: Diagnosis not present

## 2014-06-15 DIAGNOSIS — I82409 Acute embolism and thrombosis of unspecified deep veins of unspecified lower extremity: Secondary | ICD-10-CM | POA: Diagnosis not present

## 2014-06-16 DIAGNOSIS — I82409 Acute embolism and thrombosis of unspecified deep veins of unspecified lower extremity: Secondary | ICD-10-CM | POA: Diagnosis not present

## 2014-06-17 DIAGNOSIS — I82409 Acute embolism and thrombosis of unspecified deep veins of unspecified lower extremity: Secondary | ICD-10-CM | POA: Diagnosis not present

## 2014-06-20 DIAGNOSIS — I82409 Acute embolism and thrombosis of unspecified deep veins of unspecified lower extremity: Secondary | ICD-10-CM | POA: Diagnosis not present

## 2014-06-21 DIAGNOSIS — I82409 Acute embolism and thrombosis of unspecified deep veins of unspecified lower extremity: Secondary | ICD-10-CM | POA: Diagnosis not present

## 2014-06-22 DIAGNOSIS — I82409 Acute embolism and thrombosis of unspecified deep veins of unspecified lower extremity: Secondary | ICD-10-CM | POA: Diagnosis not present

## 2014-06-24 DIAGNOSIS — J418 Mixed simple and mucopurulent chronic bronchitis: Secondary | ICD-10-CM | POA: Diagnosis not present

## 2014-07-04 ENCOUNTER — Ambulatory Visit (INDEPENDENT_AMBULATORY_CARE_PROVIDER_SITE_OTHER): Payer: Medicare Other | Admitting: Internal Medicine

## 2014-07-19 ENCOUNTER — Ambulatory Visit (INDEPENDENT_AMBULATORY_CARE_PROVIDER_SITE_OTHER): Payer: Medicare Other | Admitting: Internal Medicine

## 2014-07-24 DIAGNOSIS — J418 Mixed simple and mucopurulent chronic bronchitis: Secondary | ICD-10-CM | POA: Diagnosis not present

## 2014-08-08 DIAGNOSIS — E782 Mixed hyperlipidemia: Secondary | ICD-10-CM | POA: Diagnosis not present

## 2014-08-08 DIAGNOSIS — I10 Essential (primary) hypertension: Secondary | ICD-10-CM | POA: Diagnosis not present

## 2014-08-10 DIAGNOSIS — E782 Mixed hyperlipidemia: Secondary | ICD-10-CM | POA: Diagnosis not present

## 2014-08-10 DIAGNOSIS — D509 Iron deficiency anemia, unspecified: Secondary | ICD-10-CM | POA: Diagnosis not present

## 2014-08-10 DIAGNOSIS — I10 Essential (primary) hypertension: Secondary | ICD-10-CM | POA: Diagnosis not present

## 2014-08-10 DIAGNOSIS — N183 Chronic kidney disease, stage 3 (moderate): Secondary | ICD-10-CM | POA: Diagnosis not present

## 2014-08-24 ENCOUNTER — Other Ambulatory Visit (INDEPENDENT_AMBULATORY_CARE_PROVIDER_SITE_OTHER): Payer: Self-pay | Admitting: Internal Medicine

## 2014-08-24 DIAGNOSIS — J418 Mixed simple and mucopurulent chronic bronchitis: Secondary | ICD-10-CM | POA: Diagnosis not present

## 2014-09-24 DIAGNOSIS — J418 Mixed simple and mucopurulent chronic bronchitis: Secondary | ICD-10-CM | POA: Diagnosis not present

## 2014-10-24 ENCOUNTER — Encounter (INDEPENDENT_AMBULATORY_CARE_PROVIDER_SITE_OTHER): Payer: Self-pay | Admitting: Internal Medicine

## 2014-10-24 ENCOUNTER — Ambulatory Visit (INDEPENDENT_AMBULATORY_CARE_PROVIDER_SITE_OTHER): Payer: Medicare Other | Admitting: Internal Medicine

## 2014-10-24 ENCOUNTER — Encounter (INDEPENDENT_AMBULATORY_CARE_PROVIDER_SITE_OTHER): Payer: Self-pay | Admitting: *Deleted

## 2014-10-24 VITALS — BP 102/70 | HR 66 | Temp 97.2°F | Resp 18 | Ht 67.0 in | Wt 168.0 lb

## 2014-10-24 DIAGNOSIS — K7460 Unspecified cirrhosis of liver: Secondary | ICD-10-CM | POA: Diagnosis not present

## 2014-10-24 DIAGNOSIS — J418 Mixed simple and mucopurulent chronic bronchitis: Secondary | ICD-10-CM | POA: Diagnosis not present

## 2014-10-24 DIAGNOSIS — R1312 Dysphagia, oropharyngeal phase: Secondary | ICD-10-CM

## 2014-10-24 DIAGNOSIS — K219 Gastro-esophageal reflux disease without esophagitis: Secondary | ICD-10-CM | POA: Diagnosis not present

## 2014-10-24 NOTE — Progress Notes (Signed)
Presenting complaint;  Follow-up for oropharyngeal dysphagia GERD and cirrhosis.  Subjective:  Kurt Burke is an 81 year old African-American male who is here for scheduled visit. He was last seen in January 2016 when he was complaining of dysphagia. He has history of erosive reflux esophagitis and hiatal hernia. Barium study was obtained and revealed age related impairment of esophageal motility and no obstruction to passage of barium pill from oral cavity to stomach but he was noted to have significant vallecular and piriform sinus residuals and laryngeal penetration and aspiration of contrast. He was therefore referred to Ms. Genene Churn CCC-SLP for further evaluation. He was noted to have significant oropharyngeal dysphagia and he was given instructions. Ms. Starleen Blue worked with him on 3 different occasions. Patient states he has no difficulty swallowing soft foods or liquids. He stays away from dry foods or steak. He states heartburns well controlled with therapy. He denies cough or hoarseness. His appetite is fair. He has lost 3 pounds his last visit. His bowels move daily. He denies melena or rectal bleeding. He complains of numbness to fingertips of both hands. He has no difficulty with his feet. He denies weakness. He does not have any difficulty using his hands. He lives alone. Nursing aide visits him daily from 8 AM to noon.    Current Medications: Outpatient Encounter Prescriptions as of 10/24/2014  Medication Sig  . AFLURIA SUSP   . carbamide peroxide (DEBROX) 6.5 % otic solution Place 5 drops into both ears 2 (two) times daily as needed (ear wax removal).   . DEXILANT 60 MG capsule TAKE 1 CAPSULE BY MOUTH ONCE DAILY FOR ACID REFLUX.  Marland Kitchen ENSURE (ENSURE) Take 237 mLs by mouth daily as needed (for nutrition).   . Fexofenadine-Pseudoephedrine (ALLEGRA-D 24 HOUR PO) Take by mouth daily.  . fluticasone (FLONASE) 50 MCG/ACT nasal spray   . hydrochlorothiazide (HYDRODIURIL) 25 MG tablet Take 25  mg by mouth daily.  Marland Kitchen lisinopril (PRINIVIL,ZESTRIL) 40 MG tablet Take 40 mg by mouth daily.  . meclizine (ANTIVERT) 25 MG tablet Take 25 mg by mouth daily.   . metoprolol succinate (TOPROL-XL) 50 MG 24 hr tablet Take 50 mg by mouth daily.  Marland Kitchen tiotropium (SPIRIVA) 18 MCG inhalation capsule Place 18 mcg into inhaler and inhale every evening.   Marland Kitchen ZETIA 10 MG tablet Take 10 mg by mouth daily.  . [DISCONTINUED] XARELTO STARTER PACK 15 & 20 MG TBPK Take 15-20 mg by mouth as directed. Take as directed on package: Start with one 15mg  tablet by mouth twice a day with food. On Day 22, switch to one 20mg  tablet once a day with food. (Patient not taking: Reported on 10/24/2014)   No facility-administered encounter medications on file as of 10/24/2014.     Objective: Blood pressure 102/70, pulse 66, temperature 97.2 F (36.2 C), temperature source Oral, resp. rate 18, height 5\' 7"  (1.702 m), weight 168 lb (76.204 kg). Patient is alert and in no acute distress. He does not have asterixis. He is using cane to move around. He had no difficulty moving from chair to examination table. Conjunctiva is pink. Sclera is nonicteric Oropharyngeal mucosa is normal. No neck masses or thyromegaly noted. Cardiac exam with regular rhythm normal S1 and S2. No murmur or gallop noted. Lungs are clear to auscultation. Abdomen is full but soft and nontender without organomegaly or masses.  No LE edema or clubbing noted.  Labs/studies Results: Lab data from 03/29/2014  WBC 5.3 H&H 12.4 and 38.4 and platelet count 278K Electrolytes  within normal limits. BUN 17 and creatinine 1.63  Glucose 85  Bilirubin 0.6, AP 64, AST 15, ALT less than 8, total protein 6.8 and albumin 4.1.  Serum calcium 9.6.    Assessment:  #1. GERD. He has history of ulcerative reflux esophagitis and hiatal hernia. He is doing well with therapy. #2. Oropharyngeal dysphagia. He was poorly evaluated earlier this year and seem to be coping well with  help from Ms. Dabney Porter CCC-SLP. #3. Cirrhosis. He was diagnosed cirrhosis because of enlarged caudate lobe and esophageal varices. He has well preserved hepatic function. Will try to assess degree of fibrosis with next ultrasound.    Plan:  Continue Dexilant at 60 mg by mouth every morning. Upper abdominal ultrasound with elastography in December 2016. Office visit in 6 months.

## 2014-10-24 NOTE — Patient Instructions (Signed)
Ultrasound of liver to be done in December 2016. Will request copy of recent blood work from Dr. Juel Burrow office for review.

## 2014-10-28 ENCOUNTER — Encounter (INDEPENDENT_AMBULATORY_CARE_PROVIDER_SITE_OTHER): Payer: Self-pay

## 2014-11-24 DIAGNOSIS — J418 Mixed simple and mucopurulent chronic bronchitis: Secondary | ICD-10-CM | POA: Diagnosis not present

## 2014-12-07 ENCOUNTER — Ambulatory Visit (HOSPITAL_COMMUNITY): Admission: RE | Admit: 2014-12-07 | Payer: Medicare Other | Source: Ambulatory Visit

## 2014-12-14 DIAGNOSIS — J449 Chronic obstructive pulmonary disease, unspecified: Secondary | ICD-10-CM | POA: Diagnosis not present

## 2014-12-14 DIAGNOSIS — K219 Gastro-esophageal reflux disease without esophagitis: Secondary | ICD-10-CM | POA: Diagnosis not present

## 2014-12-24 DIAGNOSIS — J418 Mixed simple and mucopurulent chronic bronchitis: Secondary | ICD-10-CM | POA: Diagnosis not present

## 2015-01-13 DIAGNOSIS — K219 Gastro-esophageal reflux disease without esophagitis: Secondary | ICD-10-CM | POA: Diagnosis not present

## 2015-01-13 DIAGNOSIS — J449 Chronic obstructive pulmonary disease, unspecified: Secondary | ICD-10-CM | POA: Diagnosis not present

## 2015-01-19 DIAGNOSIS — I82409 Acute embolism and thrombosis of unspecified deep veins of unspecified lower extremity: Secondary | ICD-10-CM | POA: Diagnosis not present

## 2015-01-24 DIAGNOSIS — J418 Mixed simple and mucopurulent chronic bronchitis: Secondary | ICD-10-CM | POA: Diagnosis not present

## 2015-02-03 ENCOUNTER — Encounter (INDEPENDENT_AMBULATORY_CARE_PROVIDER_SITE_OTHER): Payer: Self-pay | Admitting: Internal Medicine

## 2015-02-10 DIAGNOSIS — E782 Mixed hyperlipidemia: Secondary | ICD-10-CM | POA: Diagnosis not present

## 2015-02-10 DIAGNOSIS — N183 Chronic kidney disease, stage 3 (moderate): Secondary | ICD-10-CM | POA: Diagnosis not present

## 2015-02-13 DIAGNOSIS — I10 Essential (primary) hypertension: Secondary | ICD-10-CM | POA: Diagnosis not present

## 2015-02-13 DIAGNOSIS — N183 Chronic kidney disease, stage 3 (moderate): Secondary | ICD-10-CM | POA: Diagnosis not present

## 2015-02-13 DIAGNOSIS — I482 Chronic atrial fibrillation: Secondary | ICD-10-CM | POA: Diagnosis not present

## 2015-02-13 DIAGNOSIS — E782 Mixed hyperlipidemia: Secondary | ICD-10-CM | POA: Diagnosis not present

## 2015-02-14 DIAGNOSIS — J449 Chronic obstructive pulmonary disease, unspecified: Secondary | ICD-10-CM | POA: Diagnosis not present

## 2015-02-14 DIAGNOSIS — H5212 Myopia, left eye: Secondary | ICD-10-CM | POA: Diagnosis not present

## 2015-02-14 DIAGNOSIS — H43812 Vitreous degeneration, left eye: Secondary | ICD-10-CM | POA: Diagnosis not present

## 2015-02-14 DIAGNOSIS — K219 Gastro-esophageal reflux disease without esophagitis: Secondary | ICD-10-CM | POA: Diagnosis not present

## 2015-02-14 DIAGNOSIS — H5201 Hypermetropia, right eye: Secondary | ICD-10-CM | POA: Diagnosis not present

## 2015-02-14 DIAGNOSIS — H43811 Vitreous degeneration, right eye: Secondary | ICD-10-CM | POA: Diagnosis not present

## 2015-02-17 DIAGNOSIS — I482 Chronic atrial fibrillation: Secondary | ICD-10-CM | POA: Diagnosis not present

## 2015-02-17 DIAGNOSIS — J00 Acute nasopharyngitis [common cold]: Secondary | ICD-10-CM | POA: Diagnosis not present

## 2015-02-17 DIAGNOSIS — J441 Chronic obstructive pulmonary disease with (acute) exacerbation: Secondary | ICD-10-CM | POA: Diagnosis not present

## 2015-02-17 DIAGNOSIS — R0602 Shortness of breath: Secondary | ICD-10-CM | POA: Diagnosis not present

## 2015-02-17 DIAGNOSIS — I10 Essential (primary) hypertension: Secondary | ICD-10-CM | POA: Diagnosis not present

## 2015-02-21 ENCOUNTER — Other Ambulatory Visit (HOSPITAL_COMMUNITY): Payer: Self-pay | Admitting: Internal Medicine

## 2015-02-21 ENCOUNTER — Ambulatory Visit (HOSPITAL_COMMUNITY)
Admission: RE | Admit: 2015-02-21 | Discharge: 2015-02-21 | Disposition: A | Discharge status: Home / Self Care | Payer: Medicare Other | Source: Ambulatory Visit | Attending: Internal Medicine | Admitting: Internal Medicine

## 2015-02-21 DIAGNOSIS — J00 Acute nasopharyngitis [common cold]: Secondary | ICD-10-CM | POA: Diagnosis not present

## 2015-02-21 DIAGNOSIS — R0602 Shortness of breath: Secondary | ICD-10-CM

## 2015-02-21 DIAGNOSIS — R918 Other nonspecific abnormal finding of lung field: Secondary | ICD-10-CM | POA: Diagnosis not present

## 2015-02-21 DIAGNOSIS — R05 Cough: Secondary | ICD-10-CM | POA: Diagnosis not present

## 2015-02-21 DIAGNOSIS — J441 Chronic obstructive pulmonary disease with (acute) exacerbation: Secondary | ICD-10-CM | POA: Diagnosis not present

## 2015-02-21 DIAGNOSIS — R059 Cough, unspecified: Secondary | ICD-10-CM

## 2015-02-23 ENCOUNTER — Other Ambulatory Visit (INDEPENDENT_AMBULATORY_CARE_PROVIDER_SITE_OTHER): Payer: Self-pay | Admitting: Internal Medicine

## 2015-02-24 DIAGNOSIS — R69 Illness, unspecified: Secondary | ICD-10-CM | POA: Diagnosis not present

## 2015-02-24 DIAGNOSIS — J418 Mixed simple and mucopurulent chronic bronchitis: Secondary | ICD-10-CM | POA: Diagnosis not present

## 2015-02-26 ENCOUNTER — Encounter (HOSPITAL_COMMUNITY): Payer: Self-pay | Admitting: Emergency Medicine

## 2015-02-26 ENCOUNTER — Observation Stay (HOSPITAL_COMMUNITY)
Admission: EM | Admit: 2015-02-26 | Discharge: 2015-02-27 | Disposition: A | Discharge status: Home / Self Care | Payer: Medicare Other | Attending: Internal Medicine | Admitting: Internal Medicine

## 2015-02-26 ENCOUNTER — Emergency Department (HOSPITAL_COMMUNITY): Payer: Medicare Other

## 2015-02-26 DIAGNOSIS — K219 Gastro-esophageal reflux disease without esophagitis: Secondary | ICD-10-CM | POA: Insufficient documentation

## 2015-02-26 DIAGNOSIS — R627 Adult failure to thrive: Secondary | ICD-10-CM | POA: Diagnosis not present

## 2015-02-26 DIAGNOSIS — R531 Weakness: Secondary | ICD-10-CM | POA: Diagnosis not present

## 2015-02-26 DIAGNOSIS — M1990 Unspecified osteoarthritis, unspecified site: Secondary | ICD-10-CM | POA: Diagnosis not present

## 2015-02-26 DIAGNOSIS — J441 Chronic obstructive pulmonary disease with (acute) exacerbation: Secondary | ICD-10-CM | POA: Diagnosis not present

## 2015-02-26 DIAGNOSIS — Z87891 Personal history of nicotine dependence: Secondary | ICD-10-CM | POA: Diagnosis not present

## 2015-02-26 DIAGNOSIS — R069 Unspecified abnormalities of breathing: Secondary | ICD-10-CM | POA: Diagnosis not present

## 2015-02-26 DIAGNOSIS — R0602 Shortness of breath: Secondary | ICD-10-CM | POA: Diagnosis not present

## 2015-02-26 DIAGNOSIS — E860 Dehydration: Principal | ICD-10-CM | POA: Insufficient documentation

## 2015-02-26 DIAGNOSIS — N179 Acute kidney failure, unspecified: Secondary | ICD-10-CM | POA: Diagnosis not present

## 2015-02-26 DIAGNOSIS — E041 Nontoxic single thyroid nodule: Secondary | ICD-10-CM

## 2015-02-26 DIAGNOSIS — Z79899 Other long term (current) drug therapy: Secondary | ICD-10-CM | POA: Diagnosis not present

## 2015-02-26 DIAGNOSIS — Z8701 Personal history of pneumonia (recurrent): Secondary | ICD-10-CM | POA: Diagnosis not present

## 2015-02-26 LAB — CBC WITH DIFFERENTIAL/PLATELET
BASOS PCT: 0 %
Basophils Absolute: 0 10*3/uL (ref 0.0–0.1)
EOS ABS: 0 10*3/uL (ref 0.0–0.7)
Eosinophils Relative: 0 %
HCT: 38.4 % — ABNORMAL LOW (ref 39.0–52.0)
Hemoglobin: 12.6 g/dL — ABNORMAL LOW (ref 13.0–17.0)
Lymphocytes Relative: 20 %
Lymphs Abs: 1.3 10*3/uL (ref 0.7–4.0)
MCH: 28.8 pg (ref 26.0–34.0)
MCHC: 32.8 g/dL (ref 30.0–36.0)
MCV: 87.9 fL (ref 78.0–100.0)
MONO ABS: 0.8 10*3/uL (ref 0.1–1.0)
MONOS PCT: 12 %
Neutro Abs: 4.6 10*3/uL (ref 1.7–7.7)
Neutrophils Relative %: 68 %
Platelets: 239 10*3/uL (ref 150–400)
RBC: 4.37 MIL/uL (ref 4.22–5.81)
RDW: 13.3 % (ref 11.5–15.5)
WBC: 6.8 10*3/uL (ref 4.0–10.5)

## 2015-02-26 LAB — COMPREHENSIVE METABOLIC PANEL
ALBUMIN: 3.2 g/dL — AB (ref 3.5–5.0)
ALT: 26 U/L (ref 17–63)
ANION GAP: 12 (ref 5–15)
AST: 37 U/L (ref 15–41)
Alkaline Phosphatase: 37 U/L — ABNORMAL LOW (ref 38–126)
BUN: 50 mg/dL — ABNORMAL HIGH (ref 6–20)
CO2: 24 mmol/L (ref 22–32)
Calcium: 9.9 mg/dL (ref 8.9–10.3)
Chloride: 100 mmol/L — ABNORMAL LOW (ref 101–111)
Creatinine, Ser: 2.36 mg/dL — ABNORMAL HIGH (ref 0.61–1.24)
GFR calc non Af Amer: 24 mL/min — ABNORMAL LOW (ref >60)
GFR, EST AFRICAN AMERICAN: 28 mL/min — AB (ref >60)
GLUCOSE: 99 mg/dL (ref 65–99)
POTASSIUM: 3.9 mmol/L (ref 3.5–5.1)
SODIUM: 136 mmol/L (ref 135–145)
Total Bilirubin: 1.2 mg/dL (ref 0.3–1.2)
Total Protein: 6.5 g/dL (ref 6.5–8.1)

## 2015-02-26 LAB — INFLUENZA PANEL BY PCR (TYPE A & B)
H1N1FLUPCR: NOT DETECTED
INFLAPCR: NEGATIVE
Influenza B By PCR: NEGATIVE

## 2015-02-26 LAB — TSH: TSH: 1.022 u[IU]/mL (ref 0.350–4.500)

## 2015-02-26 LAB — TROPONIN I
TROPONIN I: 0.04 ng/mL — AB (ref <0.031)
Troponin I: 0.05 ng/mL — ABNORMAL HIGH (ref <0.031)

## 2015-02-26 MED ORDER — ONDANSETRON HCL 4 MG/2ML IJ SOLN: 4.0000 mg | Freq: Four times a day (QID) | INTRAMUSCULAR | Status: DC | PRN

## 2015-02-26 MED ORDER — ONDANSETRON HCL 4 MG PO TABS: 4.0000 mg | ORAL_TABLET | Freq: Four times a day (QID) | ORAL | Status: DC | PRN

## 2015-02-26 MED ORDER — SODIUM CHLORIDE 0.9 % IV BOLUS (SEPSIS)
500.0000 mL | Freq: Once | INTRAVENOUS | Status: AC
Start: 2015-02-26 — End: 2015-02-26
  Administered 2015-02-26: 500 mL via INTRAVENOUS

## 2015-02-26 MED ORDER — ENOXAPARIN SODIUM 30 MG/0.3ML ~~LOC~~ SOLN: 30.0000 mg | SUBCUTANEOUS | Status: DC

## 2015-02-26 MED ORDER — ENOXAPARIN SODIUM 30 MG/0.3ML ~~LOC~~ SOLN
30.0000 mg | SUBCUTANEOUS | Status: DC
Start: 2015-02-26 — End: 2015-02-26

## 2015-02-26 MED ORDER — SODIUM CHLORIDE 0.9 % IV SOLN: INTRAVENOUS | Status: AC

## 2015-02-26 NOTE — H&P (Signed)
[facility] History and Physical  TICE MCMEANS [id number] DOB: September 18, 1929 DOA: 02/26/2015  Referring physician:  PCP: Wende Neighbors, MD   Chief Complaint: Generalized Weakness  HPI: GRAINGER LOMBOY is a 82 y.o. male with a past medical history of hypertension, COPD, oropharyngeal dysphasia, cirrhosis, presenting to the emergency department with complaints of shortness of breath associated with generalized weakness. He states that he has experienced weakness with "no appetite" over the past 3 days. He lives at home but has an aide who assists him with activities of daily living. He denies nausea, vomiting, diarrhea however reports subjective fevers and chills. He has not had any falls. Today he reports having some shortness of breath. He states being diagnosed with pneumonia by his primary care physician and was treated with Levaquin.  Chest x-ray and CT scan of lungs do not show evidence of airspace consolidation. Labs did show elevated creatinine of 2.36, presented 1.5 on 06/02/2013.                                                 Review of Systems:  Constitutional:  No weight loss, night sweats, positive for subjective Fevers, chills, fatigue poor tolerance to physical exertion.  HEENT:  No headaches, Difficulty swallowing,Tooth/dental problems,Sore throat,  No sneezing, itching, ear ache, nasal congestion, post nasal drip,  Cardio-vascular:  No chest pain, Orthopnea, PND, swelling in lower extremities, anasarca, dizziness, palpitations  GI:  No heartburn, indigestion, abdominal pain, nausea, vomiting, diarrhea, change in bowel habits, loss of appetite  Resp:  Positive for shortness of breath with exertion or at rest. No excess mucus, no productive cough, No non-productive cough, No coughing up of blood.No change in color of mucus.No wheezing.No chest wall deformity  Skin:  no rash or lesions.  GU:  no dysuria, change in color of urine, no urgency or frequency. No flank pain.   Musculoskeletal:  No joint pain or swelling. No decreased range of motion. No back pain.  Psych:  No change in mood or affect. No depression or anxiety. No memory loss.   Past Medical History  Diagnosis Date  . GERD (gastroesophageal reflux disease)   . Hypertension   . HOH (hard of hearing)   . Shortness of breath     exertion  . Arthritis   . COPD (chronic obstructive pulmonary disease) [facility])    Past Surgical History  Procedure Laterality Date  . Cystoscopy  2000    Preformed to remove cyst from liver at [facility]      bilat KPE  . Colonoscopy  10/07/2011    Procedure: COLONOSCOPY;  Surgeon: Rogene Houston, MD;  Location: [facility];  Service: Endoscopy;  Laterality: N/A;  730   Social History:  reports that he quit smoking about 24 years ago. His smoking use included Cigarettes. He has never used smokeless tobacco. He reports that he does not drink alcohol or use illicit drugs.  Allergies  Allergen Reactions  . Acetaminophen   . Codeine Other (See Comments)    Unknown reaction  . Ibuprofen     MD took patient off as he had a blood clot in his leg    Family History  Problem Relation Age of Onset  . Heart disease Mother   . Heart disease Father   . Healthy Sister   .  Arthritis      Prior to Admission medications   Medication Sig Start Date End Date Taking? Authorizing Provider  AFLURIA SUSP  10/12/13   Historical Provider, MD  carbamide peroxide (DEBROX) 6.5 % otic solution Place 5 drops into both ears 2 (two) times daily as needed (ear wax removal).     Historical Provider, MD  DEXILANT 60 MG capsule TAKE 1 CAPSULE BY MOUTH ONCE DAILY FOR ACID REFLUX. 02/23/15   Butch Penny, NP  ENSURE (ENSURE) Take 237 mLs by mouth daily as needed (for nutrition).     Historical Provider, MD  Fexofenadine-Pseudoephedrine (ALLEGRA-D 24 HOUR PO) Take by mouth daily.    Historical Provider, MD  fluticasone Asencion Islam) 50 MCG/ACT nasal spray  01/06/14    Historical Provider, MD  hydrochlorothiazide (HYDRODIURIL) 25 MG tablet Take 25 mg by mouth daily. 06/02/13   Historical Provider, MD  levalbuterol Penne Lash) 0.63 MG/3ML nebulizer solution  02/17/15   Historical Provider, MD  levofloxacin (LEVAQUIN) 500 MG tablet Take 500 mg by mouth daily. 7 day course starting on 02/21/15 02/21/15   Historical Provider, MD  lisinopril (PRINIVIL,ZESTRIL) 40 MG tablet Take 40 mg by mouth daily. 06/02/13   Historical Provider, MD  meclizine (ANTIVERT) 25 MG tablet Take 25 mg by mouth daily.     Historical Provider, MD  metoprolol succinate (TOPROL-XL) 50 MG 24 hr tablet Take 50 mg by mouth daily. 09/12/11   Historical Provider, MD  tiotropium (SPIRIVA) 18 MCG inhalation capsule Place 18 mcg into inhaler and inhale every evening.  06/17/12   Reyne Dumas, MD  ZETIA 10 MG tablet Take 10 mg by mouth daily. 08/26/11   Historical Provider, MD   Physical Exam: Filed Vitals:   02/26/15 1250 02/26/15 1330 02/26/15 1434 02/26/15 1500  BP: 90/70 94/51 92/66  115/80  Pulse: 83 84 86 88  Temp:      TempSrc:      Resp: 22 16    Height:      Weight:      SpO2: 96% 89% 99% 98%    Wt Readings from Last 3 Encounters:  02/26/15 63.504 kg (140 lb)  10/24/14 76.204 kg (168 lb)  01/18/14 77.792 kg (171 lb 8 oz)    General:  Patient is nontoxic appearing, awake and alert, in no acute distress. Breathing comfortably room air Eyes: PERRL, normal lids, irises & conjunctiva ENT: grossly normal hearing, lips & tongue, dry oral mucosa Neck: no LAD, masses or thyromegaly Cardiovascular: RRR, no m/r/g. No LE edema. Telemetry: SR, no arrhythmias  Respiratory: He has bibasilar crackles, without rhonchi or rales. On room air breathing comfortably. Abdomen: soft, ntnd Skin: no rash or induration seen on limited exam Musculoskeletal: grossly normal tone BUE/BLE Psychiatric: grossly normal mood and affect, speech fluent and appropriate Neurologic: grossly non-focal.          Labs on  Admission:  Basic Metabolic Panel:  Recent Labs Lab 02/26/15 1226  NA 136  K 3.9  CL 100*  CO2 24  GLUCOSE 99  BUN 50*  CREATININE 2.36*  CALCIUM 9.9   Liver Function Tests:  Recent Labs Lab 02/26/15 1226  AST 37  ALT 26  ALKPHOS 37*  BILITOT 1.2  PROT 6.5  ALBUMIN 3.2*   No results for input(s): LIPASE, AMYLASE in the last 168 hours. No results for input(s): AMMONIA in the last 168 hours. CBC:  Recent Labs Lab 02/26/15 1226  WBC 6.8  NEUTROABS 4.6  HGB 12.6*  HCT 38.4*  MCV  87.9  PLT 239   Cardiac Enzymes:  Recent Labs Lab 02/26/15 1226  TROPONINI 0.05*    BNP (last 3 results) No results for input(s): BNP in the last 8760 hours.  ProBNP (last 3 results) No results for input(s): PROBNP in the last 8760 hours.  CBG: No results for input(s): GLUCAP in the last 168 hours.  Radiological Exams on Admission: Dg Chest 2 View  02/26/2015  CLINICAL DATA:  Shortness of breath.  Pneumonia. EXAM: CHEST  2 VIEW COMPARISON:  02/21/2015.  CS CT dated 08/13/2009. FINDINGS: The cardiac silhouette remains borderline enlarged. There is a more prominent, rounded appearance of the tortuous distal thoracic aorta at the medial left lung base, compared to the CT dated 07/2009. There was a small hiatal hernia in that region on the previous CT. There has been a mild decrease in right basilar atelectasis and pleural fluid. The inferior right hilum has a somewhat rounded prominent appearance. Small amount of linear atelectasis in the right mid lung zone. Diffuse osteopenia. IMPRESSION: 1. There is either been an increase in size of the hiatal hernia or interval aneurysmal dilatation of the tortuous descending thoracic aorta seen on the previous CT. These possibilities could be differentiated with a chest CT with contrast. 2. Decreased right basilar atelectasis and pleural fluid. 3. Interval linear atelectasis in the right mid lung zone. Electronically Signed   By: Claudie Revering M.D.    On: 02/26/2015 13:35   Ct Chest Wo Contrast  02/26/2015  CLINICAL DATA:  Shortness of breath. Increased retrocardiac density on chest radiograph. EXAM: CT CHEST WITHOUT CONTRAST TECHNIQUE: Multidetector CT imaging of the chest was performed following the standard protocol without IV contrast. COMPARISON:  Chest radiograph from earlier today. 08/13/2009 chest CT angiogram. FINDINGS: Mediastinum/Nodes: Normal heart size. No significant pericardial fluid. Extensive coronary atherosclerosis. Markedly tortuous atherosclerotic nonaneurysmal thoracic aorta. Normal caliber pulmonary arteries. Hypodense 1.5 cm upper right thyroid lobe nodule, increased from 1.1 cm on 08/13/2009. Fluid level in the lower thoracic esophagus. No pathologically enlarged axillary, mediastinal or gross hilar lymph nodes, noting limited sensitivity for the detection of hilar adenopathy on this noncontrast study. Lungs/Pleura: No pneumothorax. No pleural effusion. No acute consolidative airspace disease, significant pulmonary nodules or lung masses. There is diffuse mild to moderate cylindrical/varicoid bronchiectasis with associated bronchial wall thickening throughout both lungs, most prominent in the right upper lobe and medial lower lobes, which has progressed mildly since 2011. There are scattered areas of postinfectious/ postinflammatory scarring in the medial right middle lobe, lingula, posterior right upper lobe and right lower lobe. Upper abdomen: Stable moderate elevation of the right hemidiaphragm. Multiple simple appearing right renal cysts measuring up to 5.1 cm in the posterior upper right kidney. Nonobstructing 7 mm stone in the lower right kidney. No hydronephrosis in the visualized kidneys. Diverticulosis throughout the visualized colon. Small hiatal hernia, stable. Musculoskeletal: No aggressive appearing focal osseous lesions. Marked degenerative changes in the thoracic spine. IMPRESSION: 1. Diffuse bronchiectasis, mildly  progressed since 2011. Scattered areas of postinfectious/postinflammatory scarring in both lungs. No acute consolidative airspace disease. 2. Stable small hiatal hernia. 3. Markedly tortuous nonaneurysmal atherosclerotic thoracic aorta. 4. Extensive coronary atherosclerosis. 5. Right thyroid lobe 1.5 cm nodule, mildly increased in size since 2011. Recommend correlation with thyroid ultrasound as clinically warranted. This follows ACR consensus guidelines: Managing Incidental Thyroid Nodules Detected on Imaging: White Paper of the ACR Incidental Thyroid Findings Committee. J Am Coll Radiol 2015; 12:143-150. 6. Nonobstructing right nephrolithiasis. 7. Fluid in the lower thoracic  esophagus, suggesting esophageal dysmotility and/or gastroesophageal reflux. 8. Colonic diverticulosis. Electronically Signed   By: Ilona Sorrel M.D.   On: 02/26/2015 14:32    EKG: Independently reviewed.   Assessment/Plan Active Problems:   SOB (shortness of breath)   AKI (acute kidney injury) ([place])   FTT (failure to thrive) in adult   Weakness   Dehydration   1. Failure to thrive/functional decline. Mr. Burtnett is an 82 year old residing in the community presenting with 3 day history of generalized weakness, decreased by mouth intake, failure to thrive. Lab work showing acute on chronic kidney injury. I suspect failure to thrive precipitated by multiple factors including dehydration, acute renal failure. Initial workup not showing obvious infectious process. Pending urinalysis.  Plan to provide IV fluid hydration with normal saline. Will consult physical therapy. Reassess in a.m. 2. Acute on chronic renal failure. Initial labs showing creatinine of 2.36, previously 1.5 in 2015. I suspect secondary to prerenal azotemia resulting from functional decline/minimal by mouth intake over the last several days. This likely was exacerbated by diuretic therapy as well as ACE inhibitor therapy. He presented hypotensive with blood  pressure of 90/70 making ATN and possibility as well. Will discontinue antihypertensive agents, namely lisinopril and hydrochlorothiazide. Provide IV fluid hydration, repeat labs in a.m. 3. Shortness of breath. Unclear etiology, chest x-ray and a CT scan not showing evidence of acute infiltrate. He denies chest pain. He was recently treated with Levaquin. On presentation he is afebrile, breathing comfortably on room air. Will check a flu swab. Will cycle troponins. Plan to watch him overnight off of antimicrobial therapy, reassess in a.m. 4. Thyroid nodule. A CT scan of lungs performed in the emergency department revealing a right thyroid lobe nodule measuring 1.5 cm. Will further workup with thyroid ultrasound. Check a TSH. 5. History of DVT. Patient diagnosed with DVT in 2015 previously anticoagulated with Xarelto. Patient reporting his primary care physician has stopped his anticoagulation 6. Hypertension. As mentioned above patient presenting with hypotension having a systolic blood pressure of 90, will discontinue his lisinopril, hydrochlorothiazide, metoprolol.  7. DVT prophylaxis. Lovenox   Code Status: Full code Family Communication: Spoke with multiple family members present at bedside Disposition Plan: Plan to place in observation, do not anticipate him requiring greater than 2 nights hospitalization  Time spent: 34 minutes  Kelvin Cellar [facility] Pager [phone]

## 2015-02-26 NOTE — ED Notes (Signed)
Attempted to call report, RN unavailable.

## 2015-02-26 NOTE — ED Provider Notes (Signed)
CSN: [id number]     Arrival date & time 02/26/15  1155 History  By signing my name below, I, Jolayne Panther, attest that this documentation has been prepared under the direction and in the presence of Milton Ferguson, MD. Electronically Signed: Jolayne Panther, Scribe. 02/26/2015. 12:32 PM.   Chief Complaint  Patient presents with  . Shortness of Breath    Patient is a 82 y.o. male presenting with shortness of breath. The history is provided by the patient (pt complains of SOB). No language interpreter was used.  Shortness of Breath Severity:  Mild Onset quality:  Sudden Duration:  1 day Timing:  Constant Progression:  Worsening Chronicity:  New Relieved by:  Nothing Worsened by:  Nothing tried Ineffective treatments:  None tried Associated symptoms: cough   Associated symptoms: no abdominal pain, no chest pain, no headaches and no rash   Cough:    Cough characteristics:  Productive   Sputum characteristics:  Nondescript   Severity:  Mild   Onset quality:  Sudden   Duration:  3 days   Timing:  Constant   Progression:  Worsening   Chronicity:  New   HPI Comments: Kurt Burke is a 82 y.o. male who presents to the Emergency Department complaining of sudden onset of moderate SOB which began this morning. He also notes associated, generalized weakness and productive cough. Pt reports that three days ago, his PCP called and informed him that he had PNA.   Past Medical History  Diagnosis Date  . GERD (gastroesophageal reflux disease)   . Hypertension   . HOH (hard of hearing)   . Shortness of breath     exertion  . Arthritis   . COPD (chronic obstructive pulmonary disease) [facility])    Past Surgical History  Procedure Laterality Date  . Cystoscopy  2000    Preformed to remove cyst from liver at [place] surgery      bilat KPE  . Colonoscopy  10/07/2011    Procedure: COLONOSCOPY;  Surgeon: Rogene Houston, MD;  Location: [facility];  Service:  Endoscopy;  Laterality: N/A;  730   Family History  Problem Relation Age of Onset  . Heart disease Mother   . Heart disease Father   . Healthy Sister   . Arthritis     Social History  Substance Use Topics  . Smoking status: Former Smoker    Types: Cigarettes    Quit date: 10/22/1990  . Smokeless tobacco: Never Used  . Alcohol Use: No    Review of Systems  Constitutional: Negative for appetite change and fatigue.  HENT: Negative for congestion, ear discharge and sinus pressure.   Eyes: Negative for discharge.  Respiratory: Positive for cough and shortness of breath.   Cardiovascular: Negative for chest pain.  Gastrointestinal: Negative for abdominal pain and diarrhea.  Genitourinary: Negative for frequency and hematuria.  Musculoskeletal: Negative for back pain.  Skin: Negative for rash.  Neurological: Positive for weakness. Negative for seizures and headaches.  Psychiatric/Behavioral: Negative for hallucinations.   Allergies  Acetaminophen; Codeine; and Ibuprofen  Home Medications   Prior to Admission medications   Medication Sig Start Date End Date Taking? Authorizing Provider  AFLURIA SUSP  10/12/13   Historical Provider, MD  carbamide peroxide (DEBROX) 6.5 % otic solution Place 5 drops into both ears 2 (two) times daily as needed (ear wax removal).     Historical Provider, MD  DEXILANT 60 MG capsule TAKE 1 CAPSULE BY  MOUTH ONCE DAILY FOR ACID REFLUX. 02/23/15   Butch Penny, NP  ENSURE (ENSURE) Take 237 mLs by mouth daily as needed (for nutrition).     Historical Provider, MD  Fexofenadine-Pseudoephedrine (ALLEGRA-D 24 HOUR PO) Take by mouth daily.    Historical Provider, MD  fluticasone Asencion Islam) 50 MCG/ACT nasal spray  01/06/14   Historical Provider, MD  hydrochlorothiazide (HYDRODIURIL) 25 MG tablet Take 25 mg by mouth daily. 06/02/13   Historical Provider, MD  lisinopril (PRINIVIL,ZESTRIL) 40 MG tablet Take 40 mg by mouth daily. 06/02/13   Historical Provider, MD   meclizine (ANTIVERT) 25 MG tablet Take 25 mg by mouth daily.     Historical Provider, MD  metoprolol succinate (TOPROL-XL) 50 MG 24 hr tablet Take 50 mg by mouth daily. 09/12/11   Historical Provider, MD  tiotropium (SPIRIVA) 18 MCG inhalation capsule Place 18 mcg into inhaler and inhale every evening.  06/17/12   Reyne Dumas, MD  ZETIA 10 MG tablet Take 10 mg by mouth daily. 08/26/11   Historical Provider, MD   There were no vitals taken for this visit. Physical Exam  Constitutional: He is oriented to person, place, and time. He appears well-developed.  HENT:  Head: Normocephalic.  Eyes: Conjunctivae and EOM are normal. No scleral icterus.  Neck: Neck supple. No thyromegaly present.  Cardiovascular: Normal rate and regular rhythm.  Exam reveals no gallop and no friction rub.   No murmur heard. Pulmonary/Chest: No stridor. He has no wheezes. He has no rales. He exhibits no tenderness.  Crackles bilaterally  Abdominal: He exhibits no distension. There is no tenderness. There is no rebound.  Musculoskeletal: Normal range of motion. He exhibits no edema.  Lymphadenopathy:    He has no cervical adenopathy.  Neurological: He is oriented to person, place, and time. He exhibits normal muscle tone. Coordination normal.  Skin: No rash noted. No erythema.  Psychiatric: He has a normal mood and affect. His behavior is normal.    ED Course  Procedures  DIAGNOSTIC STUDIES:    Oxygen Saturation is 99% on RA, normal by my interpretation.   COORDINATION OF CARE:  12:11 PM Will order chest x-ray, EKG, and labs. Discussed treatment plan with pt at bedside and pt agreed to plan.   Labs Review Labs Reviewed - No data to display  Imaging Review No results found. I have personally reviewed and evaluated these images and lab results as part of my medical decision-making.   EKG Interpretation None      MDM   Final diagnoses:  None    Pt will be admitted for dehydration,  Copd and  elevated troponin   Milton Ferguson, MD 02/26/15 1519

## 2015-02-26 NOTE — ED Notes (Signed)
Patient arrives with shortness of breath. Dx with pneumonia 3 days ago. Worse with activity.

## 2015-02-26 NOTE — ED Notes (Signed)
Attempted to call report, charge nurse hasn't approved yet per [employer] account executive.

## 2015-02-27 ENCOUNTER — Observation Stay (HOSPITAL_COMMUNITY): Payer: Medicare Other

## 2015-02-27 DIAGNOSIS — R531 Weakness: Secondary | ICD-10-CM

## 2015-02-27 DIAGNOSIS — N179 Acute kidney failure, unspecified: Secondary | ICD-10-CM | POA: Diagnosis not present

## 2015-02-27 DIAGNOSIS — E860 Dehydration: Secondary | ICD-10-CM | POA: Diagnosis not present

## 2015-02-27 DIAGNOSIS — E042 Nontoxic multinodular goiter: Secondary | ICD-10-CM | POA: Diagnosis not present

## 2015-02-27 DIAGNOSIS — R627 Adult failure to thrive: Secondary | ICD-10-CM | POA: Diagnosis not present

## 2015-02-27 DIAGNOSIS — R0602 Shortness of breath: Secondary | ICD-10-CM | POA: Diagnosis not present

## 2015-02-27 LAB — CBC
HEMATOCRIT: 42.6 % (ref 39.0–52.0)
HEMOGLOBIN: 14 g/dL (ref 13.0–17.0)
MCH: 29.2 pg (ref 26.0–34.0)
MCHC: 32.9 g/dL (ref 30.0–36.0)
MCV: 88.8 fL (ref 78.0–100.0)
Platelets: 248 10*3/uL (ref 150–400)
RBC: 4.8 MIL/uL (ref 4.22–5.81)
RDW: 13.4 % (ref 11.5–15.5)
WBC: 7.2 10*3/uL (ref 4.0–10.5)

## 2015-02-27 LAB — URINALYSIS, ROUTINE W REFLEX MICROSCOPIC
Bilirubin Urine: NEGATIVE
GLUCOSE, UA: NEGATIVE mg/dL
HGB URINE DIPSTICK: NEGATIVE
Ketones, ur: NEGATIVE mg/dL
Leukocytes, UA: NEGATIVE
Nitrite: NEGATIVE
PH: 5 (ref 5.0–8.0)
Protein, ur: NEGATIVE mg/dL
SPECIFIC GRAVITY, URINE: 1.01 (ref 1.005–1.030)

## 2015-02-27 LAB — TROPONIN I
Troponin I: 0.05 ng/mL — ABNORMAL HIGH (ref <0.031)
Troponin I: 0.05 ng/mL — ABNORMAL HIGH (ref <0.031)

## 2015-02-27 LAB — BASIC METABOLIC PANEL
ANION GAP: 10 (ref 5–15)
BUN: 50 mg/dL — ABNORMAL HIGH (ref 6–20)
CALCIUM: 9.8 mg/dL (ref 8.9–10.3)
CO2: 26 mmol/L (ref 22–32)
Chloride: 103 mmol/L (ref 101–111)
Creatinine, Ser: 2.21 mg/dL — ABNORMAL HIGH (ref 0.61–1.24)
GFR calc Af Amer: 30 mL/min — ABNORMAL LOW (ref >60)
GFR calc non Af Amer: 26 mL/min — ABNORMAL LOW (ref >60)
GLUCOSE: 99 mg/dL (ref 65–99)
Potassium: 3.8 mmol/L (ref 3.5–5.1)
Sodium: 139 mmol/L (ref 135–145)

## 2015-02-27 MED ORDER — SODIUM CHLORIDE 0.9 % IV BOLUS (SEPSIS)
500.0000 mL | Freq: Once | INTRAVENOUS | Status: AC
  Administered 2015-02-27: 500 mL via INTRAVENOUS

## 2015-02-27 MED ORDER — ZOLPIDEM TARTRATE 5 MG PO TABS
5.0000 mg | ORAL_TABLET | Freq: Every evening | ORAL | Status: DC | PRN
  Administered 2015-02-27: 5 mg via ORAL
  Filled 2015-02-27: qty 1

## 2015-02-27 MED ORDER — SODIUM CHLORIDE 0.9 % IV SOLN: INTRAVENOUS | Status: DC

## 2015-02-27 MED ORDER — ALBUTEROL SULFATE (2.5 MG/3ML) 0.083% IN NEBU
2.5000 mg | INHALATION_SOLUTION | Freq: Once | RESPIRATORY_TRACT | Status: AC
  Administered 2015-02-27: 2.5 mg via RESPIRATORY_TRACT
  Filled 2015-02-27: qty 3

## 2015-02-27 MED ORDER — LEVALBUTEROL HCL 0.63 MG/3ML IN NEBU: 0.6300 mg | INHALATION_SOLUTION | Freq: Three times a day (TID) | RESPIRATORY_TRACT | Status: AC | PRN

## 2015-02-27 MED ORDER — AMLODIPINE BESYLATE 10 MG PO TABS: 10.0000 mg | ORAL_TABLET | Freq: Every day | ORAL | Status: DC

## 2015-02-27 NOTE — Progress Notes (Signed)
Patient states that he feels like he needs his albuterol inhaler.  Dr. Coralyn Pear notified via text page.  Dr. Coralyn Pear driving at this time but gave order for one time Albuterol nebulizer treatment.  Stated he will order albuterol inhaler for home per patient's request.

## 2015-02-27 NOTE — Care Management Note (Signed)
Case Management Note  Patient Details  Name: BARLOW OLA MRN: [id number] Date of Birth: [birth year]  Subjective/Objective:      Spoke with patient for discharge planning. Patient answers questions appropriately. Patient stated that he helives alone but that he has an aid that comes to his home daily provided by DSS from 8am to 2pm.  Patient has social worker Margretta Ditty. Patietn has been open to [facility] prior and will have PT services through them upon discharge.  Referral placed.  He stated that he has family the comes to see him regularly. Aid takes care of obtaining his medications. Denies issues with food or utilities.  Stated that he ambulates with a cane.     Action/Plan: Home with Home Health.  Expected Discharge Date:                  Expected Discharge Plan:  [place]  In-House Referral:     Discharge planning Services  CM Consult  Post Acute Care Choice:    Choice offered to:     DME Arranged:    DME Agency:     HH Arranged:  RN [place] Agency:     Status of Service:  Completed, signed off  Medicare Important Message Given:    Date Medicare IM Given:    Medicare IM give by:    Date Additional Medicare IM Given:    Additional Medicare Important Message give by:     If discussed at [place] of Stay Meetings, dates discussed:    Additional Comments:  Alvie Heidelberg, RN 02/27/2015, 10:23 AM

## 2015-02-27 NOTE — Evaluation (Signed)
Physical Therapy Evaluation Patient Details Name: Kurt Burke MRN: [id number] DOB: 18-Sep-1929 Today's Date: 02/27/2015   History of Present Illness  Kurt Burke is a 82 y.o. male with a past medical history of hypertension, COPD, oropharyngeal dysphasia, cirrhosis, presenting to the emergency department with complaints of shortness of breath associated with generalized weakness. He states that he has experienced weakness with "no appetite" over the past 3 days. He lives at home but has an aide who assists him with activities of daily living. He denies nausea, vomiting, diarrhea however reports subjective fevers and chills. He has not had any falls. Today he reports having some shortness of breath. He states being diagnosed with pneumonia by his primary care physician and was treated with Levaquin. Chest x-ray and CT scan of lungs do not show evidence of airspace consolidation. Labs did show elevated creatinine of 2.36, presented 1.5 on 06/02/2013.  Clinical Impression  Patient very pleasant, but HOH, and willing to participate in skilled PT services today. Patient demonstrated good strength in bilateral LEs but did have some mild core weakness; able to complete functional transfers well with quad cane. Ambulated approximately 158f with quad cane and supervision, no serious concerns for LOB or fall risk with gait with cane however patient did require intermittent cues for proper use of device. Did assess balance in form of tandem stance and SLS; excess muscle activity and wobble with tandem stance but able to maintain, however great difficulty with SLS at this time. Patient not in need of skilled PT services during his stay at this facility however do recommend HHPT when he returns home due to history of almost falling at times in his home per patient report.     Follow Up Recommendations Home health PT    Equipment Recommendations  None recommended by PT    Recommendations for Other  Services       Precautions / Restrictions Precautions Precautions: Fall Restrictions Weight Bearing Restrictions: No      Mobility  Bed Mobility               General bed mobility comments: DNT   Transfers Overall transfer level: Modified independent Equipment used: Quad cane                Ambulation/Gait Ambulation/Gait assistance: Supervision Ambulation Distance (Feet): 130 Feet Assistive device: Quad cane Gait Pattern/deviations: Step-through pattern;Trendelenburg;Trunk flexed     General Gait Details: appropriate for situation   Stairs            Wheelchair Mobility    Modified Rankin (Stroke Patients Only)       Balance Overall balance assessment: Needs assistance   Sitting balance-Leahy Scale: Normal       Standing balance-Leahy Scale: Fair   Single Leg Stance - Right Leg: 2 Single Leg Stance - Left Leg: 2 Tandem Stance - Right Leg:  (able to maintain but excess wobble ) Tandem Stance - Left Leg:  (able to maintain but excess wobble )       High Level Balance Comments: history of close calls with falls              Pertinent Vitals/Pain Pain Assessment: No/denies pain    Home Living Family/patient expects to be discharged to:: Private residence Living Arrangements: Alone Available Help at Discharge: Neighbor;Personal care attendant Type of Home: Apartment Home Access: Stairs to enter   [employer] Steps: 1 Home Layout: One level Home Equipment: Cane - quad Additional Comments: aide  there every day from 8-2pm     Prior Function Level of Independence: Independent with assistive device(s)               Hand Dominance        Extremity/Trunk Assessment   Upper Extremity Assessment: Defer to OT evaluation           Lower Extremity Assessment: Overall WFL for tasks assessed      Cervical / Trunk Assessment: Kyphotic  Communication   Communication: HOH  Cognition Arousal/Alertness:  Awake/alert   Overall Cognitive Status: Within Functional Limits for tasks assessed                      General Comments      Exercises        Assessment/Plan    PT Assessment All further PT needs can be met in the next venue of care  PT Diagnosis Other (comment) (unsteadiness )   PT Problem List Decreased coordination;Decreased knowledge of use of DME;Decreased balance;Decreased safety awareness  PT Treatment Interventions     PT Goals (Current goals can be found in the Care Plan section) Acute Rehab PT Goals Patient Stated Goal: to get out of hospital and go home  PT Goal Formulation: With patient Time For Goal Achievement: 03/13/15 Potential to Achieve Goals: Good    Frequency     Barriers to discharge        Co-evaluation               End of Session Equipment Utilized During Treatment: Gait belt Activity Tolerance: Patient tolerated treatment well Patient left: in bed;with call bell/phone within reach;with chair alarm set Nurse Communication: Mobility status (patient performance with PT, tolerance to activity )    Functional Assessment Tool Used: Based on skilld clinical assessment of strength, gait, balance, functional activity tolerance  Functional Limitation: Mobility: Walking and moving around Mobility: Walking and Moving Around Current Status (Y2482): At least 20 percent but less than 40 percent impaired, limited or restricted Mobility: Walking and Moving Around Goal Status [phone]): At least 20 percent but less than 40 percent impaired, limited or restricted Mobility: Walking and Moving Around Discharge Status [phone]): At least 20 percent but less than 40 percent impaired, limited or restricted    Time: 9169-4503 PT Time Calculation (min) (ACUTE ONLY): 19 min   Charges:   PT Evaluation $PT Eval Low Complexity: 1 Procedure     PT G Codes:   PT G-Codes **NOT FOR INPATIENT CLASS** Functional Assessment Tool Used: Based on skilld clinical  assessment of strength, gait, balance, functional activity tolerance  Functional Limitation: Mobility: Walking and moving around Mobility: Walking and Moving Around Current Status (U8828): At least 20 percent but less than 40 percent impaired, limited or restricted Mobility: Walking and Moving Around Goal Status [phone]): At least 20 percent but less than 40 percent impaired, limited or restricted Mobility: Walking and Moving Around Discharge Status [phone]): At least 20 percent but less than 40 percent impaired, limited or restricted    Deniece Ree PT, DPT [phone]

## 2015-02-27 NOTE — Care Management Obs Status (Signed)
[facility] NOTIFICATION   Patient Details  Name: Kurt Burke MRN: [id number] Date of Birth: 1929-02-03   Medicare Observation Status Notification Given:  Yes    Alvie Heidelberg, RN 02/27/2015, 10:30 AM

## 2015-02-27 NOTE — Progress Notes (Signed)
Follow up note  I spoke with Kurt Burke' PCP [name] to give him update. Thyroid U/S showed a solitary 1.6 cm nodule which will need fine needle aspiration. This will be set up in the outpatient setting.  Kurt Burke will also need follow up on blood pressures and kidney function. He has a follow up visit with [name] on 03/06/2015.

## 2015-02-27 NOTE — Progress Notes (Signed)
AVS reviewed with patient.  Verbalized understanding of discharge instructions, physician follow-up, medications.  Patient reports his aid comes daily and assists with his medications.  Patient's IV sites removed.  Sites WNL.  Patient transported by NT via w/c to main entrance for discharge.  Patient stable at time of discharge.

## 2015-02-27 NOTE — Plan of Care (Signed)
Problem: Safety: Goal: Ability to remain free from injury will improve Outcome: Progressing Pt bed alarm is on. Staff assisting pt when out of bed.   Problem: Physical Regulation: Goal: Ability to maintain clinical measurements within normal limits will improve Outcome: Progressing See flowsheet. Pt's blood pressure was in the 90s/50s and now is back up to 140/75.  Problem: Skin Integrity: Goal: Risk for impaired skin integrity will decrease Outcome: Progressing Pt's Braden Score is 21.  Problem: Tissue Perfusion: Goal: Risk factors for ineffective tissue perfusion will decrease Outcome: Progressing VTE is Lovenox.  Problem: Fluid Volume: Goal: Ability to maintain a balanced intake and output will improve Outcome: Progressing Pt has been urinating. Pt urinated in toilet first time, but now has a urine for measurement.   Problem: Nutrition: Goal: Adequate nutrition will be maintained Outcome: Not Progressing Pt only ate about 25% of his dinner.

## 2015-02-27 NOTE — Progress Notes (Signed)
Patient's heart rate in 110-120.  Patient asymptomatic.  Dr. Coralyn Pear notified.  Stated that it was from the Albuterol and for patient to not take the Albuterol at home.

## 2015-02-27 NOTE — Discharge Summary (Signed)
Physician Discharge Summary  Kurt Burke [id number] DOB: 1929/08/12 DOA: 02/26/2015  PCP: Wende Neighbors, MD  Admit date: 02/26/2015 Discharge date: 02/27/2015  Time spent: 35 minutes  Recommendations for Outpatient Follow-up:  1. Please follow-up on Kurt Burke blood pressures, he presented with hypotension for which lisinopril and hydrochlorothiazide were discontinued 2. Please follow up on BMP on hospital follow-up visit. He has a history of chronic kidney disease with last creatinine in Epic 1.5 in 2015. He presented with creatinine of 2.36, trending down to 2.2 on the following day after receiving IV fluids overnight. 3. Prior to discharge he was set up with home health services for home PT 4. On 02/27/2015 patient wanted to be discharged home     Discharge Diagnoses:  Active Problems:   SOB (shortness of breath)   AKI (acute kidney injury) ([place])   FTT (failure to thrive) in adult   Weakness   Dehydration   Discharge Condition: Stable  Diet recommendation: Heart healthy diet  Filed Weights   02/26/15 1222 02/26/15 1744  Weight: 63.504 kg (140 lb) 71.079 kg (156 lb 11.2 oz)    History of present illness:  Kurt Burke is a 83 y.o. male with a past medical history of hypertension, COPD, oropharyngeal dysphasia, cirrhosis, presenting to the emergency department with complaints of shortness of breath associated with generalized weakness. He states that he has experienced weakness with "no appetite" over the past 3 days. He lives at home but has an aide who assists him with activities of daily living. He denies nausea, vomiting, diarrhea however reports subjective fevers and chills. He has not had any falls. Today he reports having some shortness of breath. He states being diagnosed with pneumonia by his primary care physician and was treated with Levaquin. Chest x-ray and CT scan of lungs do not show evidence of airspace consolidation. Labs did show elevated creatinine of  2.36, presented 1.5 on 06/02/2013.  Hospital Course:  Kurt Burke is a pleasant 82 year old gentleman with a history of hypertension, COPD, admitted to medicine service on 02/26/2015 when he presented with complaints of generalized weakness and shortness of breath. He reported not having much of an appetite over the 3 days prior to hospitalization. He resides at home with a home aide providing assistance with activities of daily living. He denied nausea, vomiting or diarrhea. Initial lab work showing a creatinine of 2.36. Looking back at his records his last creatinine on Epic was 1.5 and 2015. Suspect functional decline/failure to thrive was related to dehydration. He had been on hydrochlorothiazide which likely contributed to his dehydration. I think his acute on chronic renal failure was likely secondary to combination of dehydration/hypotension, prerenal azotemia, ACE inhibitor therapy. After receiving IV fluids overnight he show significant clinical improvement by the following morning. Repeat lab work showed downward trend in his creatinine to 2.2 from 2.36. He ambulated down the hallway to the nurses station and back. Blood pressures remaining on the low normal side, lisinopril and hydrochlorothiazide were discontinued on admission. Please follow-up on his blood pressures.   Discharge Exam: Filed Vitals:   02/26/15 2215 02/27/15 0640  BP: 140/75 106/69  Pulse:  107  Temp:  97.5 F (36.4 C)  Resp:  20    General: Patient is nontoxic-appearing no acute distress, asking to go home today. Cardiovascular: Regular rate rhythm normal S1-S2 no murmurs rubs or gallops Respiratory: Normal respiratory effort Abdomen: Soft nontender nondistended Extremities: No edema  Discharge Instructions   Discharge Instructions  Call MD for:  difficulty breathing, headache or visual disturbances    Complete by:  As directed      Call MD for:  extreme fatigue    Complete by:  As directed       Call MD for:  hives    Complete by:  As directed      Call MD for:  persistant dizziness or light-headedness    Complete by:  As directed      Call MD for:  persistant nausea and vomiting    Complete by:  As directed      Call MD for:  redness, tenderness, or signs of infection (pain, swelling, redness, odor or green/yellow discharge around incision site)    Complete by:  As directed      Call MD for:  severe uncontrolled pain    Complete by:  As directed      Call MD for:  temperature >100.4    Complete by:  As directed      Call MD for:    Complete by:  As directed      Diet - low sodium heart healthy    Complete by:  As directed      Increase activity slowly    Complete by:  As directed           Current Discharge Medication List    START taking these medications   Details  amLODipine (NORVASC) 10 MG tablet Take 1 tablet (10 mg total) by mouth daily. Qty: 30 tablet, Refills: 1      CONTINUE these medications which have NOT CHANGED   Details  AFLURIA SUSP     carbamide peroxide (DEBROX) 6.5 % otic solution Place 5 drops into both ears 2 (two) times daily as needed (ear wax removal).     DEXILANT 60 MG capsule TAKE 1 CAPSULE BY MOUTH ONCE DAILY FOR ACID REFLUX. Qty: 30 capsule, Refills: 0    ENSURE (ENSURE) Take 237 mLs by mouth daily as needed (for nutrition).     Fexofenadine-Pseudoephedrine (ALLEGRA-D 24 HOUR PO) Take by mouth daily.    fluticasone (FLONASE) 50 MCG/ACT nasal spray     levalbuterol (XOPENEX) 0.63 MG/3ML nebulizer solution     meclizine (ANTIVERT) 25 MG tablet Take 25 mg by mouth daily.     metoprolol succinate (TOPROL-XL) 50 MG 24 hr tablet Take 50 mg by mouth daily.    tiotropium (SPIRIVA) 18 MCG inhalation capsule Place 18 mcg into inhaler and inhale every evening.     ZETIA 10 MG tablet Take 10 mg by mouth daily.      STOP taking these medications     hydrochlorothiazide (HYDRODIURIL) 25 MG tablet      levofloxacin (LEVAQUIN)  500 MG tablet      lisinopril (PRINIVIL,ZESTRIL) 40 MG tablet        Allergies  Allergen Reactions  . Acetaminophen   . Codeine Other (See Comments)    Unknown reaction  . Ibuprofen     MD took patient off as he had a blood clot in his leg   Follow-up Information    Follow up with Wende Neighbors, MD In 1 week.   Specialty:  Internal Medicine   Contact information:   [place] [id number] [phone]        The results of significant diagnostics from this hospitalization (including imaging, microbiology, ancillary and laboratory) are listed below for reference.    Significant Diagnostic Studies: Dg Chest 2  View  02/26/2015  CLINICAL DATA:  Shortness of breath.  Pneumonia. EXAM: CHEST  2 VIEW COMPARISON:  02/21/2015.  CS CT dated 08/13/2009. FINDINGS: The cardiac silhouette remains borderline enlarged. There is a more prominent, rounded appearance of the tortuous distal thoracic aorta at the medial left lung base, compared to the CT dated 07/2009. There was a small hiatal hernia in that region on the previous CT. There has been a mild decrease in right basilar atelectasis and pleural fluid. The inferior right hilum has a somewhat rounded prominent appearance. Small amount of linear atelectasis in the right mid lung zone. Diffuse osteopenia. IMPRESSION: 1. There is either been an increase in size of the hiatal hernia or interval aneurysmal dilatation of the tortuous descending thoracic aorta seen on the previous CT. These possibilities could be differentiated with a chest CT with contrast. 2. Decreased right basilar atelectasis and pleural fluid. 3. Interval linear atelectasis in the right mid lung zone. Electronically Signed   By: Claudie Revering M.D.   On: 02/26/2015 13:35   Dg Chest 2 View  02/21/2015  CLINICAL DATA:  Productive cough. Right-sided chest pain. Short of breath. Symptoms for 1 week. EXAM: CHEST  2 VIEW COMPARISON:  01/22/2013 FINDINGS: Normal heart size.  Stable hiatal hernia. There is pleural thickening along the right lateral lower lung and elevation of the right hemidiaphragm associated with blunting of the right costophrenic angle. On the lateral view, the posterior right costophrenic angle is distinct. This suggests either thickened pleural soft tissue or a loculated pleural effusion. There is associated pulmonary opacity at the right base which may represent volume loss. IMPRESSION: There is abnormal pleural thickening at the right lung base as described representing either soft tissue or loculated fluid. CT can be performed to further delineate. Chronic changes are noted. Electronically Signed   By: Marybelle Killings M.D.   On: 02/21/2015 16:12   Ct Chest Wo Contrast  02/26/2015  CLINICAL DATA:  Shortness of breath. Increased retrocardiac density on chest radiograph. EXAM: CT CHEST WITHOUT CONTRAST TECHNIQUE: Multidetector CT imaging of the chest was performed following the standard protocol without IV contrast. COMPARISON:  Chest radiograph from earlier today. 08/13/2009 chest CT angiogram. FINDINGS: Mediastinum/Nodes: Normal heart size. No significant pericardial fluid. Extensive coronary atherosclerosis. Markedly tortuous atherosclerotic nonaneurysmal thoracic aorta. Normal caliber pulmonary arteries. Hypodense 1.5 cm upper right thyroid lobe nodule, increased from 1.1 cm on 08/13/2009. Fluid level in the lower thoracic esophagus. No pathologically enlarged axillary, mediastinal or gross hilar lymph nodes, noting limited sensitivity for the detection of hilar adenopathy on this noncontrast study. Lungs/Pleura: No pneumothorax. No pleural effusion. No acute consolidative airspace disease, significant pulmonary nodules or lung masses. There is diffuse mild to moderate cylindrical/varicoid bronchiectasis with associated bronchial wall thickening throughout both lungs, most prominent in the right upper lobe and medial lower lobes, which has progressed mildly  since 2011. There are scattered areas of postinfectious/ postinflammatory scarring in the medial right middle lobe, lingula, posterior right upper lobe and right lower lobe. Upper abdomen: Stable moderate elevation of the right hemidiaphragm. Multiple simple appearing right renal cysts measuring up to 5.1 cm in the posterior upper right kidney. Nonobstructing 7 mm stone in the lower right kidney. No hydronephrosis in the visualized kidneys. Diverticulosis throughout the visualized colon. Small hiatal hernia, stable. Musculoskeletal: No aggressive appearing focal osseous lesions. Marked degenerative changes in the thoracic spine. IMPRESSION: 1. Diffuse bronchiectasis, mildly progressed since 2011. Scattered areas of postinfectious/postinflammatory scarring in both lungs. No acute consolidative  airspace disease. 2. Stable small hiatal hernia. 3. Markedly tortuous nonaneurysmal atherosclerotic thoracic aorta. 4. Extensive coronary atherosclerosis. 5. Right thyroid lobe 1.5 cm nodule, mildly increased in size since 2011. Recommend correlation with thyroid ultrasound as clinically warranted. This follows ACR consensus guidelines: Managing Incidental Thyroid Nodules Detected on Imaging: White Paper of the ACR Incidental Thyroid Findings Committee. J Am Coll Radiol 2015; 12:143-150. 6. Nonobstructing right nephrolithiasis. 7. Fluid in the lower thoracic esophagus, suggesting esophageal dysmotility and/or gastroesophageal reflux. 8. Colonic diverticulosis. Electronically Signed   By: Ilona Sorrel M.D.   On: 02/26/2015 14:32    Microbiology: No results found for this or any previous visit (from the past 240 hour(s)).   Labs: Basic Metabolic Panel:  Recent Labs Lab 02/26/15 1226 02/27/15 0646  NA 136 139  K 3.9 3.8  CL 100* 103  CO2 24 26  GLUCOSE 99 99  BUN 50* 50*  CREATININE 2.36* 2.21*  CALCIUM 9.9 9.8   Liver Function Tests:  Recent Labs Lab 02/26/15 1226  AST 37  ALT 26  ALKPHOS 37*   BILITOT 1.2  PROT 6.5  ALBUMIN 3.2*   No results for input(s): LIPASE, AMYLASE in the last 168 hours. No results for input(s): AMMONIA in the last 168 hours. CBC:  Recent Labs Lab 02/26/15 1226 02/27/15 0646  WBC 6.8 7.2  NEUTROABS 4.6  --   HGB 12.6* 14.0  HCT 38.4* 42.6  MCV 87.9 88.8  PLT 239 248   Cardiac Enzymes:  Recent Labs Lab 02/26/15 1226 02/26/15 2020 02/27/15 0006 02/27/15 0646  TROPONINI 0.05* 0.04* 0.05* 0.05*   BNP: BNP (last 3 results) No results for input(s): BNP in the last 8760 hours.  ProBNP (last 3 results) No results for input(s): PROBNP in the last 8760 hours.  CBG: No results for input(s): GLUCAP in the last 168 hours.     Signed:  Kelvin Cellar MD.  [facility] 02/27/2015, 9:06 AM

## 2015-02-28 DIAGNOSIS — E860 Dehydration: Secondary | ICD-10-CM | POA: Diagnosis not present

## 2015-02-28 DIAGNOSIS — R627 Adult failure to thrive: Secondary | ICD-10-CM | POA: Diagnosis not present

## 2015-02-28 DIAGNOSIS — K219 Gastro-esophageal reflux disease without esophagitis: Secondary | ICD-10-CM | POA: Diagnosis not present

## 2015-02-28 DIAGNOSIS — R1312 Dysphagia, oropharyngeal phase: Secondary | ICD-10-CM | POA: Diagnosis not present

## 2015-02-28 DIAGNOSIS — J449 Chronic obstructive pulmonary disease, unspecified: Secondary | ICD-10-CM | POA: Diagnosis not present

## 2015-02-28 DIAGNOSIS — I10 Essential (primary) hypertension: Secondary | ICD-10-CM | POA: Diagnosis not present

## 2015-02-28 DIAGNOSIS — K7460 Unspecified cirrhosis of liver: Secondary | ICD-10-CM | POA: Diagnosis not present

## 2015-03-02 DIAGNOSIS — K219 Gastro-esophageal reflux disease without esophagitis: Secondary | ICD-10-CM | POA: Diagnosis not present

## 2015-03-02 DIAGNOSIS — I10 Essential (primary) hypertension: Secondary | ICD-10-CM | POA: Diagnosis not present

## 2015-03-02 DIAGNOSIS — J449 Chronic obstructive pulmonary disease, unspecified: Secondary | ICD-10-CM | POA: Diagnosis not present

## 2015-03-02 DIAGNOSIS — R627 Adult failure to thrive: Secondary | ICD-10-CM | POA: Diagnosis not present

## 2015-03-02 DIAGNOSIS — K7460 Unspecified cirrhosis of liver: Secondary | ICD-10-CM | POA: Diagnosis not present

## 2015-03-02 DIAGNOSIS — R1312 Dysphagia, oropharyngeal phase: Secondary | ICD-10-CM | POA: Diagnosis not present

## 2015-03-02 DIAGNOSIS — E860 Dehydration: Secondary | ICD-10-CM | POA: Diagnosis not present

## 2015-03-06 DIAGNOSIS — K219 Gastro-esophageal reflux disease without esophagitis: Secondary | ICD-10-CM | POA: Diagnosis not present

## 2015-03-06 DIAGNOSIS — R627 Adult failure to thrive: Secondary | ICD-10-CM | POA: Diagnosis not present

## 2015-03-06 DIAGNOSIS — J449 Chronic obstructive pulmonary disease, unspecified: Secondary | ICD-10-CM | POA: Diagnosis not present

## 2015-03-06 DIAGNOSIS — R1312 Dysphagia, oropharyngeal phase: Secondary | ICD-10-CM | POA: Diagnosis not present

## 2015-03-06 DIAGNOSIS — K7460 Unspecified cirrhosis of liver: Secondary | ICD-10-CM | POA: Diagnosis not present

## 2015-03-06 DIAGNOSIS — E860 Dehydration: Secondary | ICD-10-CM | POA: Diagnosis not present

## 2015-03-06 DIAGNOSIS — I10 Essential (primary) hypertension: Secondary | ICD-10-CM | POA: Diagnosis not present

## 2015-03-06 DIAGNOSIS — I959 Hypotension, unspecified: Secondary | ICD-10-CM | POA: Diagnosis not present

## 2015-03-06 DIAGNOSIS — N184 Chronic kidney disease, stage 4 (severe): Secondary | ICD-10-CM | POA: Diagnosis not present

## 2015-03-06 DIAGNOSIS — J189 Pneumonia, unspecified organism: Secondary | ICD-10-CM | POA: Diagnosis not present

## 2015-03-07 DIAGNOSIS — R627 Adult failure to thrive: Secondary | ICD-10-CM | POA: Diagnosis not present

## 2015-03-07 DIAGNOSIS — K7460 Unspecified cirrhosis of liver: Secondary | ICD-10-CM | POA: Diagnosis not present

## 2015-03-07 DIAGNOSIS — R1312 Dysphagia, oropharyngeal phase: Secondary | ICD-10-CM | POA: Diagnosis not present

## 2015-03-07 DIAGNOSIS — J449 Chronic obstructive pulmonary disease, unspecified: Secondary | ICD-10-CM | POA: Diagnosis not present

## 2015-03-07 DIAGNOSIS — I10 Essential (primary) hypertension: Secondary | ICD-10-CM | POA: Diagnosis not present

## 2015-03-07 DIAGNOSIS — E860 Dehydration: Secondary | ICD-10-CM | POA: Diagnosis not present

## 2015-03-07 DIAGNOSIS — K219 Gastro-esophageal reflux disease without esophagitis: Secondary | ICD-10-CM | POA: Diagnosis not present

## 2015-03-09 DIAGNOSIS — K219 Gastro-esophageal reflux disease without esophagitis: Secondary | ICD-10-CM | POA: Diagnosis not present

## 2015-03-09 DIAGNOSIS — J449 Chronic obstructive pulmonary disease, unspecified: Secondary | ICD-10-CM | POA: Diagnosis not present

## 2015-03-09 DIAGNOSIS — I10 Essential (primary) hypertension: Secondary | ICD-10-CM | POA: Diagnosis not present

## 2015-03-09 DIAGNOSIS — E860 Dehydration: Secondary | ICD-10-CM | POA: Diagnosis not present

## 2015-03-09 DIAGNOSIS — R1312 Dysphagia, oropharyngeal phase: Secondary | ICD-10-CM | POA: Diagnosis not present

## 2015-03-09 DIAGNOSIS — R627 Adult failure to thrive: Secondary | ICD-10-CM | POA: Diagnosis not present

## 2015-03-09 DIAGNOSIS — K7460 Unspecified cirrhosis of liver: Secondary | ICD-10-CM | POA: Diagnosis not present

## 2015-03-14 DIAGNOSIS — I10 Essential (primary) hypertension: Secondary | ICD-10-CM | POA: Diagnosis not present

## 2015-03-14 DIAGNOSIS — K7460 Unspecified cirrhosis of liver: Secondary | ICD-10-CM | POA: Diagnosis not present

## 2015-03-14 DIAGNOSIS — R1312 Dysphagia, oropharyngeal phase: Secondary | ICD-10-CM | POA: Diagnosis not present

## 2015-03-14 DIAGNOSIS — J449 Chronic obstructive pulmonary disease, unspecified: Secondary | ICD-10-CM | POA: Diagnosis not present

## 2015-03-14 DIAGNOSIS — R627 Adult failure to thrive: Secondary | ICD-10-CM | POA: Diagnosis not present

## 2015-03-14 DIAGNOSIS — K219 Gastro-esophageal reflux disease without esophagitis: Secondary | ICD-10-CM | POA: Diagnosis not present

## 2015-03-14 DIAGNOSIS — E860 Dehydration: Secondary | ICD-10-CM | POA: Diagnosis not present

## 2015-03-16 DIAGNOSIS — E860 Dehydration: Secondary | ICD-10-CM | POA: Diagnosis not present

## 2015-03-16 DIAGNOSIS — K219 Gastro-esophageal reflux disease without esophagitis: Secondary | ICD-10-CM | POA: Diagnosis not present

## 2015-03-16 DIAGNOSIS — I10 Essential (primary) hypertension: Secondary | ICD-10-CM | POA: Diagnosis not present

## 2015-03-16 DIAGNOSIS — R1312 Dysphagia, oropharyngeal phase: Secondary | ICD-10-CM | POA: Diagnosis not present

## 2015-03-16 DIAGNOSIS — R627 Adult failure to thrive: Secondary | ICD-10-CM | POA: Diagnosis not present

## 2015-03-16 DIAGNOSIS — K7460 Unspecified cirrhosis of liver: Secondary | ICD-10-CM | POA: Diagnosis not present

## 2015-03-16 DIAGNOSIS — J449 Chronic obstructive pulmonary disease, unspecified: Secondary | ICD-10-CM | POA: Diagnosis not present

## 2015-03-17 DIAGNOSIS — K219 Gastro-esophageal reflux disease without esophagitis: Secondary | ICD-10-CM | POA: Diagnosis not present

## 2015-03-17 DIAGNOSIS — E860 Dehydration: Secondary | ICD-10-CM | POA: Diagnosis not present

## 2015-03-17 DIAGNOSIS — K7460 Unspecified cirrhosis of liver: Secondary | ICD-10-CM | POA: Diagnosis not present

## 2015-03-17 DIAGNOSIS — J449 Chronic obstructive pulmonary disease, unspecified: Secondary | ICD-10-CM | POA: Diagnosis not present

## 2015-03-17 DIAGNOSIS — I10 Essential (primary) hypertension: Secondary | ICD-10-CM | POA: Diagnosis not present

## 2015-03-17 DIAGNOSIS — R1312 Dysphagia, oropharyngeal phase: Secondary | ICD-10-CM | POA: Diagnosis not present

## 2015-03-17 DIAGNOSIS — R627 Adult failure to thrive: Secondary | ICD-10-CM | POA: Diagnosis not present

## 2015-03-21 DIAGNOSIS — K7460 Unspecified cirrhosis of liver: Secondary | ICD-10-CM | POA: Diagnosis not present

## 2015-03-21 DIAGNOSIS — R1312 Dysphagia, oropharyngeal phase: Secondary | ICD-10-CM | POA: Diagnosis not present

## 2015-03-21 DIAGNOSIS — R627 Adult failure to thrive: Secondary | ICD-10-CM | POA: Diagnosis not present

## 2015-03-21 DIAGNOSIS — E860 Dehydration: Secondary | ICD-10-CM | POA: Diagnosis not present

## 2015-03-21 DIAGNOSIS — K219 Gastro-esophageal reflux disease without esophagitis: Secondary | ICD-10-CM | POA: Diagnosis not present

## 2015-03-21 DIAGNOSIS — I10 Essential (primary) hypertension: Secondary | ICD-10-CM | POA: Diagnosis not present

## 2015-03-21 DIAGNOSIS — J449 Chronic obstructive pulmonary disease, unspecified: Secondary | ICD-10-CM | POA: Diagnosis not present

## 2015-03-23 ENCOUNTER — Other Ambulatory Visit (INDEPENDENT_AMBULATORY_CARE_PROVIDER_SITE_OTHER): Payer: Self-pay | Admitting: Internal Medicine

## 2015-03-23 DIAGNOSIS — K7460 Unspecified cirrhosis of liver: Secondary | ICD-10-CM | POA: Diagnosis not present

## 2015-03-23 DIAGNOSIS — R1312 Dysphagia, oropharyngeal phase: Secondary | ICD-10-CM | POA: Diagnosis not present

## 2015-03-23 DIAGNOSIS — E860 Dehydration: Secondary | ICD-10-CM | POA: Diagnosis not present

## 2015-03-23 DIAGNOSIS — K219 Gastro-esophageal reflux disease without esophagitis: Secondary | ICD-10-CM | POA: Diagnosis not present

## 2015-03-23 DIAGNOSIS — I10 Essential (primary) hypertension: Secondary | ICD-10-CM | POA: Diagnosis not present

## 2015-03-23 DIAGNOSIS — R627 Adult failure to thrive: Secondary | ICD-10-CM | POA: Diagnosis not present

## 2015-03-23 DIAGNOSIS — J449 Chronic obstructive pulmonary disease, unspecified: Secondary | ICD-10-CM | POA: Diagnosis not present

## 2015-03-24 DIAGNOSIS — J418 Mixed simple and mucopurulent chronic bronchitis: Secondary | ICD-10-CM | POA: Diagnosis not present

## 2015-04-14 DIAGNOSIS — R600 Localized edema: Secondary | ICD-10-CM | POA: Diagnosis not present

## 2015-04-14 DIAGNOSIS — K219 Gastro-esophageal reflux disease without esophagitis: Secondary | ICD-10-CM | POA: Diagnosis not present

## 2015-04-14 DIAGNOSIS — J449 Chronic obstructive pulmonary disease, unspecified: Secondary | ICD-10-CM | POA: Diagnosis not present

## 2015-04-14 DIAGNOSIS — N184 Chronic kidney disease, stage 4 (severe): Secondary | ICD-10-CM | POA: Diagnosis not present

## 2015-04-14 DIAGNOSIS — R609 Edema, unspecified: Secondary | ICD-10-CM | POA: Diagnosis not present

## 2015-04-24 DIAGNOSIS — J418 Mixed simple and mucopurulent chronic bronchitis: Secondary | ICD-10-CM | POA: Diagnosis not present

## 2015-05-04 ENCOUNTER — Encounter (INDEPENDENT_AMBULATORY_CARE_PROVIDER_SITE_OTHER): Payer: Self-pay | Admitting: Internal Medicine

## 2015-05-04 ENCOUNTER — Ambulatory Visit (INDEPENDENT_AMBULATORY_CARE_PROVIDER_SITE_OTHER): Payer: Medicare Other | Admitting: Internal Medicine

## 2015-05-11 DIAGNOSIS — N184 Chronic kidney disease, stage 4 (severe): Secondary | ICD-10-CM | POA: Diagnosis not present

## 2015-05-11 DIAGNOSIS — R600 Localized edema: Secondary | ICD-10-CM | POA: Diagnosis not present

## 2015-05-11 DIAGNOSIS — R609 Edema, unspecified: Secondary | ICD-10-CM | POA: Diagnosis not present

## 2015-05-14 DIAGNOSIS — K219 Gastro-esophageal reflux disease without esophagitis: Secondary | ICD-10-CM | POA: Diagnosis not present

## 2015-05-14 DIAGNOSIS — J449 Chronic obstructive pulmonary disease, unspecified: Secondary | ICD-10-CM | POA: Diagnosis not present

## 2015-05-24 DIAGNOSIS — J418 Mixed simple and mucopurulent chronic bronchitis: Secondary | ICD-10-CM | POA: Diagnosis not present

## 2015-06-13 DIAGNOSIS — I10 Essential (primary) hypertension: Secondary | ICD-10-CM | POA: Diagnosis not present

## 2015-06-13 DIAGNOSIS — E782 Mixed hyperlipidemia: Secondary | ICD-10-CM | POA: Diagnosis not present

## 2015-06-14 DIAGNOSIS — K219 Gastro-esophageal reflux disease without esophagitis: Secondary | ICD-10-CM | POA: Diagnosis not present

## 2015-06-14 DIAGNOSIS — J449 Chronic obstructive pulmonary disease, unspecified: Secondary | ICD-10-CM | POA: Diagnosis not present

## 2015-06-24 DIAGNOSIS — J418 Mixed simple and mucopurulent chronic bronchitis: Secondary | ICD-10-CM | POA: Diagnosis not present

## 2015-06-27 DIAGNOSIS — H04123 Dry eye syndrome of bilateral lacrimal glands: Secondary | ICD-10-CM | POA: Diagnosis not present

## 2015-07-14 DIAGNOSIS — J449 Chronic obstructive pulmonary disease, unspecified: Secondary | ICD-10-CM | POA: Diagnosis not present

## 2015-07-14 DIAGNOSIS — K219 Gastro-esophageal reflux disease without esophagitis: Secondary | ICD-10-CM | POA: Diagnosis not present

## 2015-07-24 DIAGNOSIS — J418 Mixed simple and mucopurulent chronic bronchitis: Secondary | ICD-10-CM | POA: Diagnosis not present

## 2015-08-14 DIAGNOSIS — J449 Chronic obstructive pulmonary disease, unspecified: Secondary | ICD-10-CM | POA: Diagnosis not present

## 2015-08-14 DIAGNOSIS — K219 Gastro-esophageal reflux disease without esophagitis: Secondary | ICD-10-CM | POA: Diagnosis not present

## 2015-08-24 DIAGNOSIS — J418 Mixed simple and mucopurulent chronic bronchitis: Secondary | ICD-10-CM | POA: Diagnosis not present

## 2015-09-14 DIAGNOSIS — K219 Gastro-esophageal reflux disease without esophagitis: Secondary | ICD-10-CM | POA: Diagnosis not present

## 2015-09-14 DIAGNOSIS — J449 Chronic obstructive pulmonary disease, unspecified: Secondary | ICD-10-CM | POA: Diagnosis not present

## 2015-09-22 DIAGNOSIS — R0981 Nasal congestion: Secondary | ICD-10-CM | POA: Diagnosis not present

## 2015-09-22 DIAGNOSIS — R05 Cough: Secondary | ICD-10-CM | POA: Diagnosis not present

## 2015-09-22 DIAGNOSIS — D509 Iron deficiency anemia, unspecified: Secondary | ICD-10-CM | POA: Diagnosis not present

## 2015-09-22 DIAGNOSIS — E782 Mixed hyperlipidemia: Secondary | ICD-10-CM | POA: Diagnosis not present

## 2015-09-22 DIAGNOSIS — E559 Vitamin D deficiency, unspecified: Secondary | ICD-10-CM | POA: Diagnosis not present

## 2015-09-24 DIAGNOSIS — J418 Mixed simple and mucopurulent chronic bronchitis: Secondary | ICD-10-CM | POA: Diagnosis not present

## 2015-09-25 DIAGNOSIS — J449 Chronic obstructive pulmonary disease, unspecified: Secondary | ICD-10-CM | POA: Diagnosis not present

## 2015-09-25 DIAGNOSIS — I10 Essential (primary) hypertension: Secondary | ICD-10-CM | POA: Diagnosis not present

## 2015-09-25 DIAGNOSIS — N183 Chronic kidney disease, stage 3 (moderate): Secondary | ICD-10-CM | POA: Diagnosis not present

## 2015-09-25 DIAGNOSIS — Z23 Encounter for immunization: Secondary | ICD-10-CM | POA: Diagnosis not present

## 2015-09-25 DIAGNOSIS — E559 Vitamin D deficiency, unspecified: Secondary | ICD-10-CM | POA: Diagnosis not present

## 2015-09-25 DIAGNOSIS — E782 Mixed hyperlipidemia: Secondary | ICD-10-CM | POA: Diagnosis not present

## 2015-10-14 DIAGNOSIS — J449 Chronic obstructive pulmonary disease, unspecified: Secondary | ICD-10-CM | POA: Diagnosis not present

## 2015-10-14 DIAGNOSIS — I82409 Acute embolism and thrombosis of unspecified deep veins of unspecified lower extremity: Secondary | ICD-10-CM | POA: Diagnosis not present

## 2015-10-14 DIAGNOSIS — K219 Gastro-esophageal reflux disease without esophagitis: Secondary | ICD-10-CM | POA: Diagnosis not present

## 2015-10-24 DIAGNOSIS — J418 Mixed simple and mucopurulent chronic bronchitis: Secondary | ICD-10-CM | POA: Diagnosis not present

## 2015-10-26 IMAGING — RF DG ESOPHAGUS
16 of 24 series · 16 of 24 positions shown · non-contrast
Comparison: None

CLINICAL DATA: Cervical esophageal dysphagia for 2-3 years worse in
last 3-4 months, mucus in throat all of the time, remote history of
dilatations, history COPD, hypertension, GERD

EXAM:
ESOPHOGRAM / BARIUM SWALLOW / BARIUM TABLET STUDY
TECHNIQUE: Combined double contrast and single contrast examination performed
using effervescent crystals, thick barium liquid, and thin barium
liquid. The patient was observed with fluoroscopy swallowing a 13mm
barium sulphate tablet.
FLUOROSCOPY TIME:  2 min 54 seconds

[Series 1: run · 1 of 1 slices shown (1 of 16)]
[im 1/1]
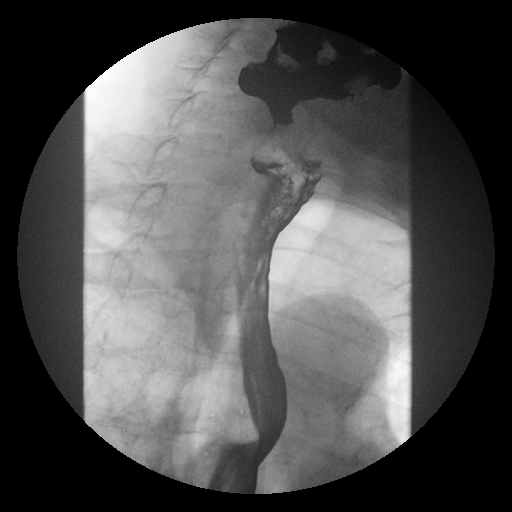

[Series 3: run · 1 of 1 slices shown (2 of 16)]
[im 1/1]
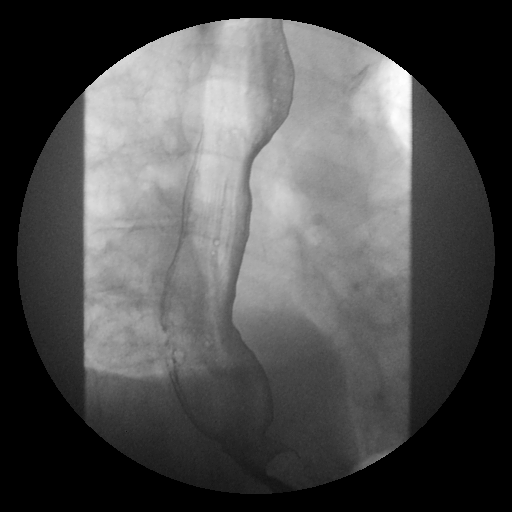

[Series 4: run · 1 of 1 slices shown (3 of 16)]
[im 1/1]
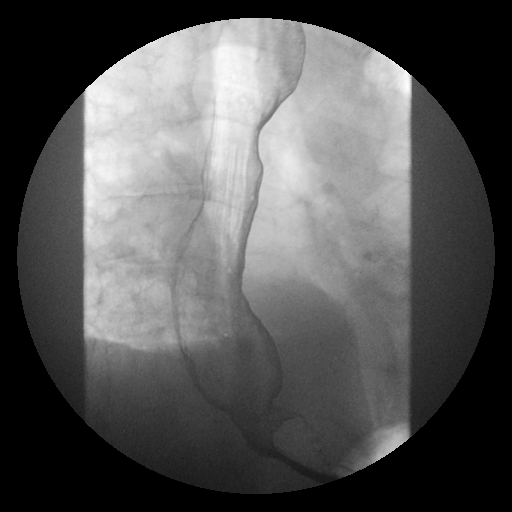

[Series 6: run · 1 of 1 slices shown (4 of 16)]
[im 1/1]
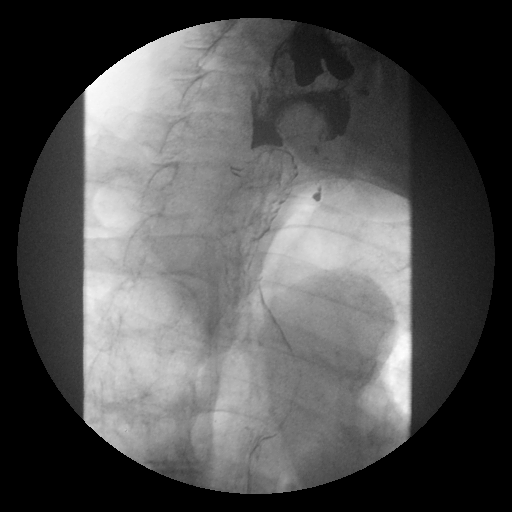

[Series 7: run · 1 of 1 slices shown (5 of 16)]
[im 1/1]
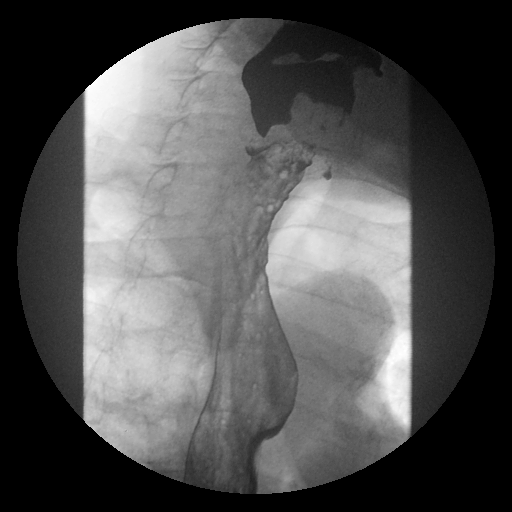

[Series 9: run · 1 of 1 slices shown (6 of 16)]
[im 1/1]
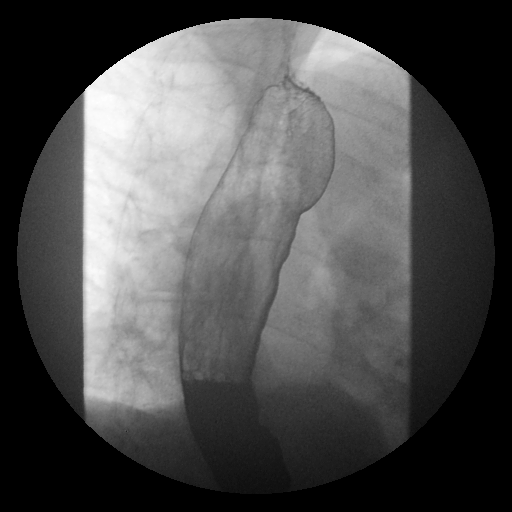

[Series 10: run · 1 of 1 slices shown (7 of 16)]
[im 1/1]
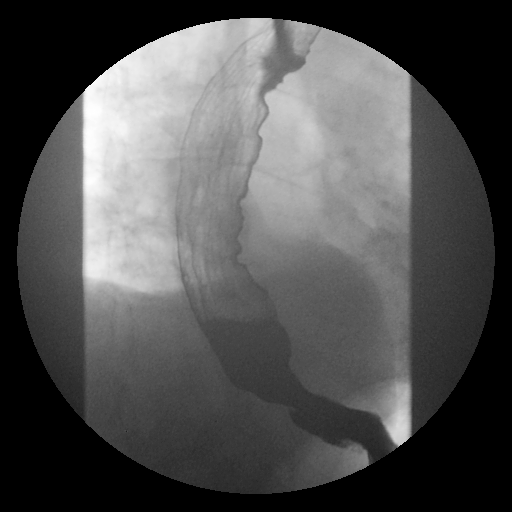

[Series 12: run · 1 of 1 slices shown (8 of 16)]
[im 1/1]
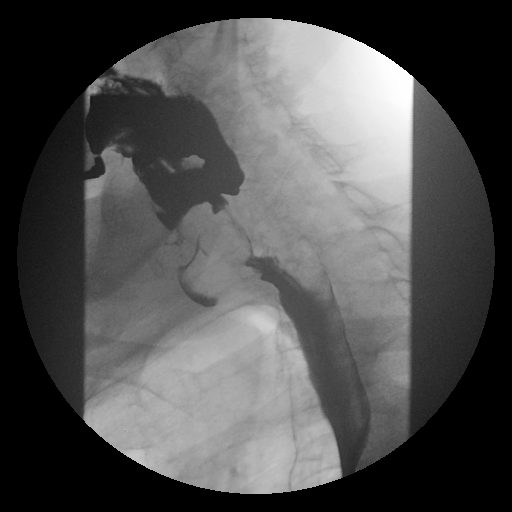

[Series 13: run · 1 of 1 slices shown (9 of 16)]
[im 1/1]
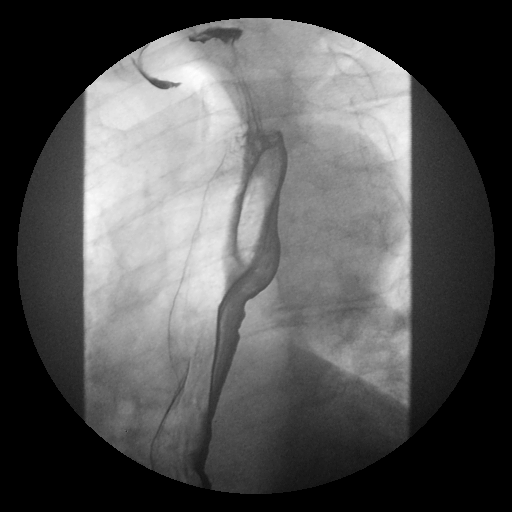

[Series 15: run · 1 of 1 slices shown (10 of 16)]
[im 1/1]
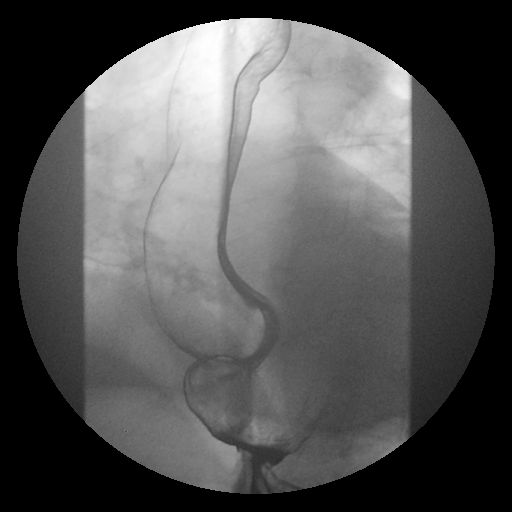

[Series 16: run · 1 of 1 slices shown (11 of 16)]
[im 1/1]
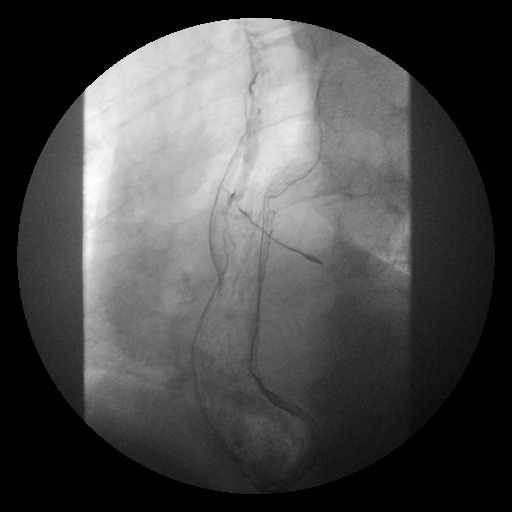

[Series 18: run · 1 of 8 slices shown (12 of 16)]
[im 1/8]
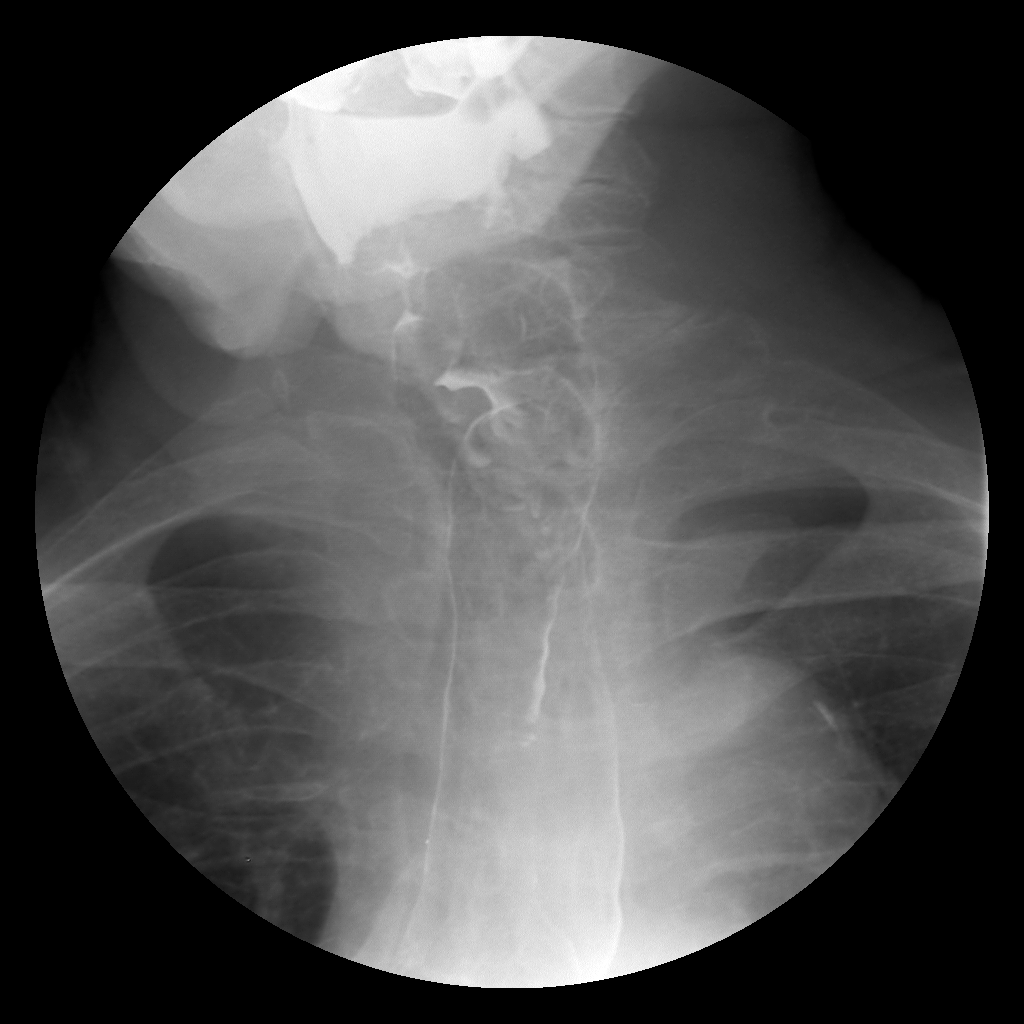

[Series 19: run · 1 of 1 slices shown (13 of 16)]
[im 1/1]
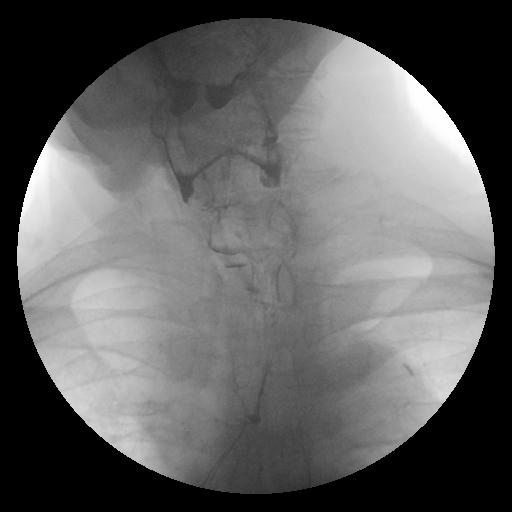

[Series 21: run · 1 of 1 slices shown (14 of 16)]
[im 1/1]
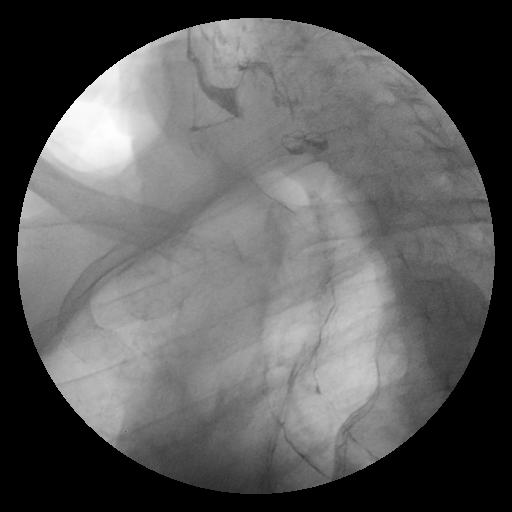

[Series 22: run · 1 of 1 slices shown (15 of 16)]
[im 1/1]
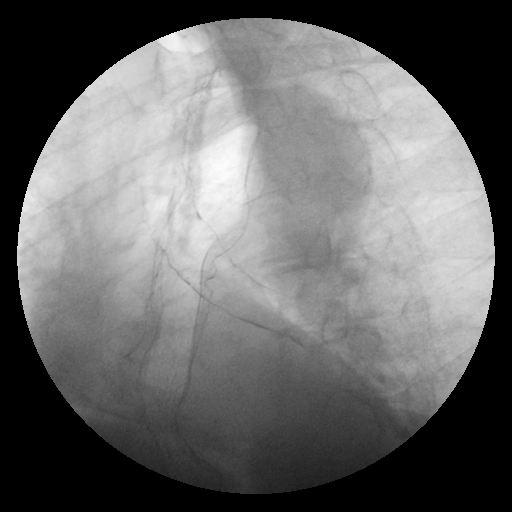

[Series 24: run · 1 of 1 slices shown (16 of 16)]
[im 1/1]
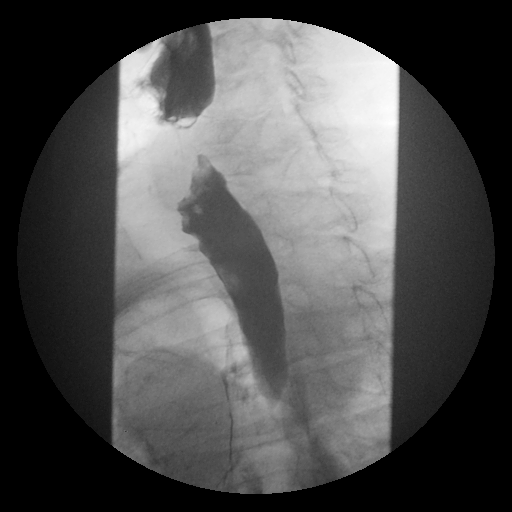

[16 of 24 positions shown; findings below may reference images not displayed]

FINDINGS: Normal esophageal distention.

No evidence of esophageal mass or stricture.

No persistent intraluminal filling defects.

Diffuse age-related impairment of esophageal motility.

Significant vallecular and piriform sinus residuals noted.

Additionally, moderate to large amount of retained contrast is seen
within the pharynx and hypopharynx though patient indicates he feels
like everything has cleared.

Patient demonstrated laryngeal penetration and aspiration of
contrast on multiple swallows with thin trickles of barium in the
trachea and into the RIGHT and LEFT mainstem bronchi and extending
into bronchus intermedius.

No spontaneous cough reflex.

Smooth appearance of esophageal walls without gross irregularity or
ulceration.

12.5 mm diameter barium tablet passed from oral cavity to stomach
without obstruction.
IMPRESSION: Silent laryngeal penetration and aspiration of contrast extending
into the trachea, mainstem bronchi and bronchus intermedius.

Significant residuals within the pharynx, hypopharynx, vallecula and
piriform sinuses.

Age-related esophageal dysmotility.

No evidence of esophageal mass or stricture.

Findings discussed with Dr. Billiot on 01/21/2014 at 1101 hr.

## 2015-11-14 DIAGNOSIS — K219 Gastro-esophageal reflux disease without esophagitis: Secondary | ICD-10-CM | POA: Diagnosis not present

## 2015-11-14 DIAGNOSIS — J449 Chronic obstructive pulmonary disease, unspecified: Secondary | ICD-10-CM | POA: Diagnosis not present

## 2015-11-24 DIAGNOSIS — J418 Mixed simple and mucopurulent chronic bronchitis: Secondary | ICD-10-CM | POA: Diagnosis not present

## 2015-11-26 ENCOUNTER — Emergency Department (HOSPITAL_COMMUNITY)
Admission: EM | Admit: 2015-11-26 | Discharge: 2015-11-26 | Disposition: A | Discharge status: Home / Self Care | Payer: Medicare Other | Attending: Emergency Medicine | Admitting: Emergency Medicine

## 2015-11-26 ENCOUNTER — Emergency Department (HOSPITAL_COMMUNITY): Payer: Medicare Other

## 2015-11-26 ENCOUNTER — Encounter (HOSPITAL_COMMUNITY): Payer: Self-pay | Admitting: Emergency Medicine

## 2015-11-26 DIAGNOSIS — R0602 Shortness of breath: Secondary | ICD-10-CM | POA: Insufficient documentation

## 2015-11-26 DIAGNOSIS — Z79899 Other long term (current) drug therapy: Secondary | ICD-10-CM | POA: Insufficient documentation

## 2015-11-26 DIAGNOSIS — J449 Chronic obstructive pulmonary disease, unspecified: Secondary | ICD-10-CM | POA: Diagnosis not present

## 2015-11-26 DIAGNOSIS — I10 Essential (primary) hypertension: Secondary | ICD-10-CM | POA: Insufficient documentation

## 2015-11-26 DIAGNOSIS — Z87891 Personal history of nicotine dependence: Secondary | ICD-10-CM | POA: Insufficient documentation

## 2015-11-26 LAB — CBC WITH DIFFERENTIAL/PLATELET
Basophils Absolute: 0 K/uL (ref 0.0–0.1)
Basophils Relative: 0 %
Eosinophils Absolute: 0.1 K/uL (ref 0.0–0.7)
Eosinophils Relative: 2 %
HCT: 39 % (ref 39.0–52.0)
Hemoglobin: 12.1 g/dL — ABNORMAL LOW (ref 13.0–17.0)
Lymphocytes Relative: 41 %
Lymphs Abs: 2 K/uL (ref 0.7–4.0)
MCH: 28.4 pg (ref 26.0–34.0)
MCHC: 31 g/dL (ref 30.0–36.0)
MCV: 91.5 fL (ref 78.0–100.0)
Monocytes Absolute: 0.3 K/uL (ref 0.1–1.0)
Monocytes Relative: 6 %
Neutro Abs: 2.5 K/uL (ref 1.7–7.7)
Neutrophils Relative %: 51 %
Platelets: 227 K/uL (ref 150–400)
RBC: 4.26 MIL/uL (ref 4.22–5.81)
RDW: 14.1 % (ref 11.5–15.5)
WBC: 4.9 K/uL (ref 4.0–10.5)

## 2015-11-26 LAB — COMPREHENSIVE METABOLIC PANEL WITH GFR
ALT: 16 U/L — ABNORMAL LOW (ref 17–63)
AST: 23 U/L (ref 15–41)
Albumin: 3.5 g/dL (ref 3.5–5.0)
Alkaline Phosphatase: 55 U/L (ref 38–126)
Anion gap: 6 (ref 5–15)
BUN: 11 mg/dL (ref 6–20)
CO2: 28 mmol/L (ref 22–32)
Calcium: 9.9 mg/dL (ref 8.9–10.3)
Chloride: 107 mmol/L (ref 101–111)
Creatinine, Ser: 1.34 mg/dL — ABNORMAL HIGH (ref 0.61–1.24)
GFR calc Af Amer: 54 mL/min — ABNORMAL LOW
GFR calc non Af Amer: 47 mL/min — ABNORMAL LOW
Glucose, Bld: 102 mg/dL — ABNORMAL HIGH (ref 65–99)
Potassium: 3.6 mmol/L (ref 3.5–5.1)
Sodium: 141 mmol/L (ref 135–145)
Total Bilirubin: 0.8 mg/dL (ref 0.3–1.2)
Total Protein: 6.7 g/dL (ref 6.5–8.1)

## 2015-11-26 LAB — BRAIN NATRIURETIC PEPTIDE: B Natriuretic Peptide: 139 pg/mL — ABNORMAL HIGH (ref 0.0–100.0)

## 2015-11-26 LAB — TROPONIN I: Troponin I: 0.03 ng/mL

## 2015-11-26 MED ORDER — ALBUTEROL SULFATE (2.5 MG/3ML) 0.083% IN NEBU
5.0000 mg | INHALATION_SOLUTION | Freq: Once | RESPIRATORY_TRACT | Status: AC
  Administered 2015-11-26: 5 mg via RESPIRATORY_TRACT
  Filled 2015-11-26: qty 6

## 2015-11-26 NOTE — ED Triage Notes (Signed)
Pt denies feeling SOB at present.

## 2015-11-26 NOTE — ED Notes (Signed)
Patient transported to X-ray 

## 2015-11-26 NOTE — ED Provider Notes (Signed)
[facility] DEPT Provider Note   CSN: [id number] Arrival date & time: 11/26/15  1210   By signing my name below, I, Macon Large, attest that this documentation has been prepared under the direction and in the presence of Carmin Muskrat, MD. Electronically Signed: Macon Large, ED Scribe. 11/26/15. 1:58 PM.   History   Chief Complaint Chief Complaint  Patient presents with  . Shortness of Breath   HPI HPI Comments: Kurt Burke is a 82 y.o. male who presents to the Emergency Department complaining of moderate, constant, SOB onset yesterday. Pt states he went to sleep at 6pm yesterday and kept waking up.  He is a very poor historian, but it seems as though he currently has no complaints, states that he feels better, simply having arrived at this facility. He specifically denies pain in any location, current dyspnea, current fever, nausea. He denies recent changes in medication, activity, diet.   Past Medical History:  Diagnosis Date  . Arthritis   . COPD (chronic obstructive pulmonary disease) ([place])   . GERD (gastroesophageal reflux disease)   . HOH (hard of hearing)   . Hypertension   . Shortness of breath    exertion    Patient Active Problem List   Diagnosis Date Noted  . SOB (shortness of breath) 02/26/2015  . AKI (acute kidney injury) ([place]) 02/26/2015  . FTT (failure to thrive) in adult 02/26/2015  . Weakness 02/26/2015  . Dehydration 02/26/2015  . Altered mental status 06/16/2012  . URI (upper respiratory infection) 06/16/2012  . Fever 06/16/2012  . Rectal bleeding 09/26/2011  . Rotator cuff insufficiency 09/23/2011  . GERD (gastroesophageal reflux disease) 10/23/2010  . Cirrhosis ([place]) 10/23/2010  . SHOULDER PAIN 10/17/2008  . IMPINGEMENT SYNDROME 10/17/2008  . RUPTURE ROTATOR CUFF 10/17/2008    Past Surgical History:  Procedure Laterality Date  . COLONOSCOPY  10/07/2011   Procedure: COLONOSCOPY;  Surgeon: Rogene Houston, MD;  [facility]  [facility];  Service: Endoscopy;  Laterality: N/A;  730  . CYSTOSCOPY  2000   Preformed to remove cyst from liver at [place] Medications    Prior to Admission medications   Medication Sig Start Date End Date Taking? Authorizing Provider  carbamide peroxide (DEBROX) 6.5 % otic solution Place 5 drops into both ears 2 (two) times daily as needed (ear wax removal).    Yes Historical Provider, MD  AFLURIA SUSP  10/12/13   Historical Provider, MD  DEXILANT 60 MG capsule TAKE 1 CAPSULE BY MOUTH ONCE DAILY FOR ACID REFLUX. 03/23/15   Butch Penny, NP  ENSURE (ENSURE) Take 237 mLs by mouth daily as needed (for nutrition).     Historical Provider, MD  Fexofenadine-Pseudoephedrine (ALLEGRA-D 24 HOUR PO) Take 1 tablet by mouth daily.     Historical Provider, MD  fluticasone (FLONASE) 50 MCG/ACT nasal spray Place 1 spray into both nostrils 2 (two) times daily.  01/06/14   Historical Provider, MD  levalbuterol Penne Lash) 0.63 MG/3ML nebulizer solution Take 3 mLs (0.63 mg total) by nebulization every 8 (eight) hours as needed for wheezing or shortness of breath. 02/27/15   Kelvin Cellar, MD  meclizine (ANTIVERT) 25 MG tablet Take 25 mg by mouth 4 (four) times daily as needed for dizziness.     Historical Provider, MD  metoprolol succinate (TOPROL-XL) 50 MG 24 hr tablet Take 50 mg by mouth daily. 09/12/11   Historical Provider,  MD  tiotropium (SPIRIVA) 18 MCG inhalation capsule Place 18 mcg into inhaler and inhale every evening.  06/17/12   Reyne Dumas, MD  ZETIA 10 MG tablet Take 10 mg by mouth daily. 08/26/11   Historical Provider, MD    Family History Family History  Problem Relation Age of Onset  . Heart disease Mother   . Heart disease Father   . Healthy Sister   . Arthritis      Social History Social History  Substance Use Topics  . Smoking status: Former Smoker    Types: Cigarettes    Quit date: 10/22/1990  . Smokeless tobacco: Never  Used  . Alcohol use No     Allergies   Acetaminophen; Codeine; and Ibuprofen   Review of Systems Review of Systems  Constitutional:       Per HPI, otherwise negative  HENT:       Per HPI, otherwise negative  Respiratory:       Per HPI, otherwise negative  Cardiovascular:       Per HPI, otherwise negative  Gastrointestinal: Negative for vomiting.  Endocrine:       Negative aside from HPI  Genitourinary:       Neg aside from HPI   Musculoskeletal:       Per HPI, otherwise negative  Skin: Negative.   Neurological: Negative for syncope.     Physical Exam Updated Vital Signs BP 159/97 (BP Location: Right Arm)   Pulse 91   Temp 99 F (37.2 C) (Oral)   Resp 18   Ht 5\' 4"  (1.626 m)   Wt 180 lb (81.6 kg)   SpO2 95%   BMI 30.90 kg/m   Physical Exam  Constitutional: He is oriented to person, place, and time. He appears well-developed. No distress.  HENT:  Head: Normocephalic and atraumatic.  Eyes: Conjunctivae and EOM are normal.  Cardiovascular: Normal rate and regular rhythm.   Pulmonary/Chest: Effort normal. No stridor. No respiratory distress.  Abdominal: He exhibits no distension.  Musculoskeletal: He exhibits no edema.  Neurological: He is alert and oriented to person, place, and time.  Skin: Skin is warm and dry.  Psychiatric: He has a normal mood and affect.  Nursing note and vitals reviewed.    ED Treatments / Results   DIAGNOSTIC STUDIES: Oxygen Saturation is 95% on RA, normal by my interpretation.    COORDINATION OF CARE: 1:56 PM Discussed treatment plan with pt at bedside and pt agreed to plan.  Labs (all labs ordered are listed, but only abnormal results are displayed) Labs Reviewed  COMPREHENSIVE METABOLIC PANEL - Abnormal; Notable for the following:       Result Value   Glucose, Bld 102 (*)    Creatinine, Ser 1.34 (*)    ALT 16 (*)    GFR calc non Af Amer 47 (*)    GFR calc Af Amer 54 (*)    All other components within normal limits    CBC WITH DIFFERENTIAL/PLATELET - Abnormal; Notable for the following:    Hemoglobin 12.1 (*)    All other components within normal limits  TROPONIN I - Abnormal; Notable for the following:    Troponin I 0.03 (*)    All other components within normal limits  BRAIN NATRIURETIC PEPTIDE - Abnormal; Notable for the following:    B Natriuretic Peptide 139.0 (*)    All other components within normal limits    Patient typically has minor trop elevation  EKG  EKG Interpretation  Date/Time:  Sunday November 26 2015 12:36:57 EST Ventricular Rate:  86 PR Interval:    QRS Duration: 100 QT Interval:  358 QTC Calculation: 429 R Axis:   -55 Text Interpretation:  Sinus rhythm Ventricular premature complex Prolonged PR interval Left anterior fascicular block Left axis deviation T wave abnormality Abnormal ekg Confirmed by Carmin Muskrat  MD [phone]) on 11/26/2015 2:31:01 PM       Radiology Dg Chest 1 View  Result Date: 11/26/2015 CLINICAL DATA:  Shortness of breath EXAM: CHEST 1 VIEW COMPARISON:  None. FINDINGS: The heart, hila, mediastinum, lungs, and pleura are unchanged. IMPRESSION: No active disease. Electronically Signed   By: Dorise Bullion III M.D   On: 11/26/2015 13:18    Procedures Procedures (including critical care time)  Medications Ordered in ED Medications  albuterol (PROVENTIL) (2.5 MG/3ML) 0.083% nebulizer solution 5 mg (5 mg Nebulization Given 11/26/15 1239)     Initial Impression / Assessment and Plan / ED Course  I have reviewed the triage vital signs and the nursing notes.  Pertinent labs & imaging results that were available during my care of the patient were reviewed by me and considered in my medical decision making (see chart for details).  Clinical Course     3:10 PM Patient awake, alert, no complaints.  He is aware of all findings - will f/u w PMD.  Patient presents with resolved. Here he is awake, alert, in no distress, has no ongoing  complaints. Vital signs unremarkable, and after nebulizer session has still no complaints. Patient does have multiple medical issues, though there is no evidence for acute new progression, nor pathology. Patient discharged in stable condition.  Final Clinical Impressions(s) / ED Diagnoses   Final diagnoses:  SOB (shortness of breath)    I personally performed the services described in this documentation, which was scribed in my presence. The recorded information has been reviewed and is accurate.       Carmin Muskrat, MD 11/26/15 [phone]

## 2015-11-26 NOTE — ED Notes (Signed)
CRITICAL VALUE ALERT  Critical value received:  Trop 0.03 Date of notification:  11/26/15  Time of notification:  [id number]  Critical value read back:Yes.    Nurse who received alert:  B. Olena Heckle, RN  MD notified (1st page):  Vanita Panda  Time of first page:  [phone]

## 2015-11-26 NOTE — ED Triage Notes (Signed)
Pt reports increasing SOB that started yesterday. No wheezing heard in lung fields at present. Pt denies orthopnea.

## 2015-11-26 NOTE — Discharge Instructions (Signed)
As discussed, your evaluation today has been largely reassuring.  But, it is important that you monitor your condition carefully, and do not hesitate to return to the ED if you develop new, or concerning changes in your condition. ? ?Otherwise, please follow-up with your physician for appropriate ongoing care. ? ?

## 2015-12-14 DIAGNOSIS — J449 Chronic obstructive pulmonary disease, unspecified: Secondary | ICD-10-CM | POA: Diagnosis not present

## 2015-12-14 DIAGNOSIS — K219 Gastro-esophageal reflux disease without esophagitis: Secondary | ICD-10-CM | POA: Diagnosis not present

## 2015-12-18 DIAGNOSIS — Z0000 Encounter for general adult medical examination without abnormal findings: Secondary | ICD-10-CM | POA: Diagnosis not present

## 2015-12-21 DIAGNOSIS — D509 Iron deficiency anemia, unspecified: Secondary | ICD-10-CM | POA: Diagnosis not present

## 2015-12-21 DIAGNOSIS — E559 Vitamin D deficiency, unspecified: Secondary | ICD-10-CM | POA: Diagnosis not present

## 2015-12-21 DIAGNOSIS — E782 Mixed hyperlipidemia: Secondary | ICD-10-CM | POA: Diagnosis not present

## 2015-12-24 DIAGNOSIS — J418 Mixed simple and mucopurulent chronic bronchitis: Secondary | ICD-10-CM | POA: Diagnosis not present

## 2015-12-26 DIAGNOSIS — I10 Essential (primary) hypertension: Secondary | ICD-10-CM | POA: Diagnosis not present

## 2015-12-26 DIAGNOSIS — E782 Mixed hyperlipidemia: Secondary | ICD-10-CM | POA: Diagnosis not present

## 2015-12-26 DIAGNOSIS — N183 Chronic kidney disease, stage 3 (moderate): Secondary | ICD-10-CM | POA: Diagnosis not present

## 2015-12-26 DIAGNOSIS — J449 Chronic obstructive pulmonary disease, unspecified: Secondary | ICD-10-CM | POA: Diagnosis not present

## 2015-12-26 DIAGNOSIS — D509 Iron deficiency anemia, unspecified: Secondary | ICD-10-CM | POA: Diagnosis not present

## 2016-01-14 DIAGNOSIS — K219 Gastro-esophageal reflux disease without esophagitis: Secondary | ICD-10-CM | POA: Diagnosis not present

## 2016-01-14 DIAGNOSIS — J449 Chronic obstructive pulmonary disease, unspecified: Secondary | ICD-10-CM | POA: Diagnosis not present

## 2016-01-24 DIAGNOSIS — J418 Mixed simple and mucopurulent chronic bronchitis: Secondary | ICD-10-CM | POA: Diagnosis not present

## 2016-02-14 DIAGNOSIS — J449 Chronic obstructive pulmonary disease, unspecified: Secondary | ICD-10-CM | POA: Diagnosis not present

## 2016-02-14 DIAGNOSIS — K219 Gastro-esophageal reflux disease without esophagitis: Secondary | ICD-10-CM | POA: Diagnosis not present

## 2016-02-24 DIAGNOSIS — J418 Mixed simple and mucopurulent chronic bronchitis: Secondary | ICD-10-CM | POA: Diagnosis not present

## 2016-03-13 DIAGNOSIS — J449 Chronic obstructive pulmonary disease, unspecified: Secondary | ICD-10-CM | POA: Diagnosis not present

## 2016-03-13 DIAGNOSIS — K219 Gastro-esophageal reflux disease without esophagitis: Secondary | ICD-10-CM | POA: Diagnosis not present

## 2016-03-23 DIAGNOSIS — J418 Mixed simple and mucopurulent chronic bronchitis: Secondary | ICD-10-CM | POA: Diagnosis not present

## 2016-04-13 DIAGNOSIS — K219 Gastro-esophageal reflux disease without esophagitis: Secondary | ICD-10-CM | POA: Diagnosis not present

## 2016-04-13 DIAGNOSIS — J449 Chronic obstructive pulmonary disease, unspecified: Secondary | ICD-10-CM | POA: Diagnosis not present

## 2016-04-15 ENCOUNTER — Ambulatory Visit (INDEPENDENT_AMBULATORY_CARE_PROVIDER_SITE_OTHER): Payer: Medicare Other | Admitting: Internal Medicine

## 2016-04-23 DIAGNOSIS — J418 Mixed simple and mucopurulent chronic bronchitis: Secondary | ICD-10-CM | POA: Diagnosis not present

## 2016-04-25 ENCOUNTER — Encounter (INDEPENDENT_AMBULATORY_CARE_PROVIDER_SITE_OTHER): Payer: Self-pay | Admitting: *Deleted

## 2016-04-25 ENCOUNTER — Encounter (INDEPENDENT_AMBULATORY_CARE_PROVIDER_SITE_OTHER): Payer: Self-pay

## 2016-04-25 ENCOUNTER — Encounter (INDEPENDENT_AMBULATORY_CARE_PROVIDER_SITE_OTHER): Payer: Self-pay | Admitting: Internal Medicine

## 2016-04-25 ENCOUNTER — Ambulatory Visit (INDEPENDENT_AMBULATORY_CARE_PROVIDER_SITE_OTHER): Payer: Medicare Other | Admitting: Internal Medicine

## 2016-04-25 VITALS — BP 134/66 | HR 56 | Temp 97.4°F | Ht 65.0 in | Wt 166.6 lb

## 2016-04-25 DIAGNOSIS — K7460 Unspecified cirrhosis of liver: Secondary | ICD-10-CM

## 2016-04-25 NOTE — Progress Notes (Signed)
   Subjective:    Patient ID: Kurt Burke, male    DOB: Dec 13, 1929, 83 y.o.   MRN: [id number]  HPI Presents today with c/o dysphagia. He was last seen by Dr. Laural Golden in 2016. Hx of dysphagia.  Hx of erosive esophagitis and hiatal hernia.  He tells me he is doing pretty good. His appetite is good. He has maintained his weight. He says he really is not having any trouble swallowing.  He has a BM x 1 a day.  Hx of Cirrhosis.  He lives by himself. Does have a caregiver.   01/21/2014 US abdomen.   IMPRESSION: 1. Echogenic liver compatible with hepatic steatosis. 2. Bilateral renal cysts. Hepatic Function Latest Ref Rng & Units 11/26/2015 02/26/2015 06/02/2013  Total Protein 6.5 - 8.1 g/dL 6.7 6.5 7.7  Albumin 3.5 - 5.0 g/dL 3.5 3.2(L) 3.8  AST 15 - 41 U/L 23 37 21  ALT 17 - 63 U/L 16(L) 26 12  Alk Phosphatase 38 - 126 U/L 55 37(L) 80  Total Bilirubin 0.3 - 1.2 mg/dL 0.8 1.2 0.4    CBC Latest Ref Rng & Units 11/26/2015 02/27/2015 02/26/2015  WBC 4.0 - 10.5 K/uL 4.9 7.2 6.8  Hemoglobin 13.0 - 17.0 g/dL 12.1(L) 14.0 12.6(L)  Hematocrit 39.0 - 52.0 % 39.0 42.6 38.4(L)  Platelets 150 - 400 K/uL 227 248 239      01/11/2014 DG Esophagus:   IMPRESSION: Silent laryngeal penetration and aspiration of contrast extending into the trachea, mainstem bronchi and bronchus intermedius.  Significant residuals within the pharynx, hypopharynx, vallecula and piriform sinuses.  Patient at high risk for aspiration pneumonia.  Age-related esophageal dysmotility.  Review of Systems     Objective:   Physical Exam Blood pressure 134/66, pulse (!) 56, temperature 97.4 F (36.3 C), height '5\' 5"'$  (1.651 m), weight 166 lb 9.6 oz (75.6 kg).  Alert and oriented. Skin warm and dry. Oral mucosa is moist.   . Sclera anicteric, conjunctivae is pink. Thyroid not enlarged. No cervical lymphadenopathy. Lungs clear. Heart regular rate and rhythm.  Abdomen is soft. Bowel sounds are positive. No  hepatomegaly. No abdominal masses felt. No tenderness.  No edema to lower extremities.         Assessment & Plan:  Cirrhosis: US abdomen with Elast. Hepatic function.  Dysphagia. He says he is not really having any trouble. Needs to chew foods well. OV in 1 year.

## 2016-04-25 NOTE — Patient Instructions (Signed)
OV in 1 year.  

## 2016-04-30 ENCOUNTER — Ambulatory Visit (HOSPITAL_COMMUNITY): Payer: Medicare Other

## 2016-05-10 ENCOUNTER — Ambulatory Visit (HOSPITAL_COMMUNITY)
Admission: RE | Admit: 2016-05-10 | Discharge: 2016-05-10 | Disposition: A | Discharge status: Home / Self Care | Payer: Medicare Other | Source: Ambulatory Visit | Attending: Internal Medicine | Admitting: Internal Medicine

## 2016-05-10 DIAGNOSIS — N281 Cyst of kidney, acquired: Secondary | ICD-10-CM | POA: Diagnosis not present

## 2016-05-10 DIAGNOSIS — K760 Fatty (change of) liver, not elsewhere classified: Secondary | ICD-10-CM | POA: Diagnosis not present

## 2016-05-10 DIAGNOSIS — K7460 Unspecified cirrhosis of liver: Secondary | ICD-10-CM | POA: Insufficient documentation

## 2016-05-13 DIAGNOSIS — J449 Chronic obstructive pulmonary disease, unspecified: Secondary | ICD-10-CM | POA: Diagnosis not present

## 2016-05-13 DIAGNOSIS — K219 Gastro-esophageal reflux disease without esophagitis: Secondary | ICD-10-CM | POA: Diagnosis not present

## 2016-05-20 ENCOUNTER — Telehealth (INDEPENDENT_AMBULATORY_CARE_PROVIDER_SITE_OTHER): Payer: Self-pay | Admitting: *Deleted

## 2016-05-20 ENCOUNTER — Telehealth (INDEPENDENT_AMBULATORY_CARE_PROVIDER_SITE_OTHER): Payer: Self-pay | Admitting: Internal Medicine

## 2016-05-20 DIAGNOSIS — R1310 Dysphagia, unspecified: Secondary | ICD-10-CM

## 2016-05-20 DIAGNOSIS — R1319 Other dysphagia: Secondary | ICD-10-CM

## 2016-05-20 NOTE — Telephone Encounter (Signed)
Caregiver , [name] called and left a message that the patient had not heard from his U/S results. A call is appreciated. [phone].

## 2016-05-20 NOTE — Telephone Encounter (Signed)
I have spoken with caregiver

## 2016-05-20 NOTE — Telephone Encounter (Signed)
Esophagram sch'd 05/24/16 at 930 (915), npo 3 hrs prior, Kurt Burke is aware

## 2016-05-20 NOTE — Telephone Encounter (Signed)
Kurt Burke, [place] esophagram.Care giver's number [phone] [name])

## 2016-05-21 ENCOUNTER — Telehealth (INDEPENDENT_AMBULATORY_CARE_PROVIDER_SITE_OTHER): Payer: Self-pay | Admitting: *Deleted

## 2016-05-21 NOTE — Telephone Encounter (Signed)
Esophagram resch'd to 05/28/16 at 945, patient caregiver, [name] aware

## 2016-05-21 NOTE — Telephone Encounter (Signed)
Message was left stating that patient needs to reschedule this appointment at [facility] for his dg esophagus to next Tuesday due to transportation. Please call [phone].

## 2016-05-23 DIAGNOSIS — J418 Mixed simple and mucopurulent chronic bronchitis: Secondary | ICD-10-CM | POA: Diagnosis not present

## 2016-05-24 ENCOUNTER — Other Ambulatory Visit (HOSPITAL_COMMUNITY): Payer: Medicare Other

## 2016-05-28 ENCOUNTER — Telehealth (INDEPENDENT_AMBULATORY_CARE_PROVIDER_SITE_OTHER): Payer: Self-pay | Admitting: Internal Medicine

## 2016-05-28 ENCOUNTER — Ambulatory Visit: Payer: Medicare Other

## 2016-05-28 ENCOUNTER — Ambulatory Visit (HOSPITAL_COMMUNITY)
Admission: RE | Admit: 2016-05-28 | Discharge: 2016-05-28 | Disposition: A | Discharge status: Home / Self Care | Payer: Medicare Other | Source: Ambulatory Visit | Attending: Internal Medicine | Admitting: Internal Medicine

## 2016-05-28 DIAGNOSIS — R1310 Dysphagia, unspecified: Secondary | ICD-10-CM | POA: Insufficient documentation

## 2016-05-28 DIAGNOSIS — Q395 Congenital dilatation of esophagus: Secondary | ICD-10-CM | POA: Diagnosis not present

## 2016-05-28 DIAGNOSIS — R05 Cough: Secondary | ICD-10-CM | POA: Diagnosis not present

## 2016-05-28 DIAGNOSIS — R1319 Other dysphagia: Secondary | ICD-10-CM

## 2016-05-28 DIAGNOSIS — Y844 Aspiration of fluid as the cause of abnormal reaction of the patient, or of later complication, without mention of misadventure at the time of the procedure: Secondary | ICD-10-CM | POA: Diagnosis not present

## 2016-05-28 NOTE — Telephone Encounter (Signed)
Referral to speech pathology

## 2016-05-28 NOTE — Telephone Encounter (Signed)
err

## 2016-06-10 ENCOUNTER — Telehealth (INDEPENDENT_AMBULATORY_CARE_PROVIDER_SITE_OTHER): Payer: Self-pay | Admitting: Internal Medicine

## 2016-06-10 DIAGNOSIS — R1319 Other dysphagia: Secondary | ICD-10-CM

## 2016-06-10 DIAGNOSIS — R1310 Dysphagia, unspecified: Secondary | ICD-10-CM

## 2016-06-10 NOTE — Telephone Encounter (Signed)
err

## 2016-06-11 ENCOUNTER — Other Ambulatory Visit (HOSPITAL_COMMUNITY): Payer: Self-pay | Admitting: Specialist

## 2016-06-11 ENCOUNTER — Telehealth (HOSPITAL_COMMUNITY): Payer: Self-pay | Admitting: Speech Pathology

## 2016-06-11 DIAGNOSIS — R1314 Dysphagia, pharyngoesophageal phase: Secondary | ICD-10-CM

## 2016-06-11 NOTE — Telephone Encounter (Signed)
Called pt to reschedule pt needs MBS  no answer/l/m with sister to ask pt to call us back to reschedule this appointment. NF 06/11/2016

## 2016-06-13 DIAGNOSIS — J449 Chronic obstructive pulmonary disease, unspecified: Secondary | ICD-10-CM | POA: Diagnosis not present

## 2016-06-13 DIAGNOSIS — K219 Gastro-esophageal reflux disease without esophagitis: Secondary | ICD-10-CM | POA: Diagnosis not present

## 2016-06-18 ENCOUNTER — Telehealth (INDEPENDENT_AMBULATORY_CARE_PROVIDER_SITE_OTHER): Payer: Self-pay | Admitting: *Deleted

## 2016-06-18 NOTE — Telephone Encounter (Signed)
FYI: Message from speech therapy -- Pt's sister called stating patient does not want to do a MBSS right now. Please cx and place on hold patient may call back to reschedule.

## 2016-06-18 NOTE — Telephone Encounter (Signed)
noted 

## 2016-06-19 ENCOUNTER — Other Ambulatory Visit (HOSPITAL_COMMUNITY): Payer: Medicare Other

## 2016-06-19 ENCOUNTER — Ambulatory Visit (HOSPITAL_COMMUNITY): Payer: Medicare Other | Admitting: Speech Pathology

## 2016-06-23 DIAGNOSIS — J418 Mixed simple and mucopurulent chronic bronchitis: Secondary | ICD-10-CM | POA: Diagnosis not present

## 2016-07-13 DIAGNOSIS — J449 Chronic obstructive pulmonary disease, unspecified: Secondary | ICD-10-CM | POA: Diagnosis not present

## 2016-07-13 DIAGNOSIS — K219 Gastro-esophageal reflux disease without esophagitis: Secondary | ICD-10-CM | POA: Diagnosis not present

## 2016-07-16 DIAGNOSIS — E559 Vitamin D deficiency, unspecified: Secondary | ICD-10-CM | POA: Diagnosis not present

## 2016-07-16 DIAGNOSIS — I10 Essential (primary) hypertension: Secondary | ICD-10-CM | POA: Diagnosis not present

## 2016-07-23 DIAGNOSIS — J418 Mixed simple and mucopurulent chronic bronchitis: Secondary | ICD-10-CM | POA: Diagnosis not present

## 2016-08-13 DIAGNOSIS — K219 Gastro-esophageal reflux disease without esophagitis: Secondary | ICD-10-CM | POA: Diagnosis not present

## 2016-08-13 DIAGNOSIS — J449 Chronic obstructive pulmonary disease, unspecified: Secondary | ICD-10-CM | POA: Diagnosis not present

## 2016-08-21 DIAGNOSIS — N183 Chronic kidney disease, stage 3 (moderate): Secondary | ICD-10-CM | POA: Diagnosis not present

## 2016-08-21 DIAGNOSIS — I10 Essential (primary) hypertension: Secondary | ICD-10-CM | POA: Diagnosis not present

## 2016-08-21 DIAGNOSIS — E782 Mixed hyperlipidemia: Secondary | ICD-10-CM | POA: Diagnosis not present

## 2016-08-21 DIAGNOSIS — D509 Iron deficiency anemia, unspecified: Secondary | ICD-10-CM | POA: Diagnosis not present

## 2016-08-21 DIAGNOSIS — I482 Chronic atrial fibrillation: Secondary | ICD-10-CM | POA: Diagnosis not present

## 2016-08-23 DIAGNOSIS — J418 Mixed simple and mucopurulent chronic bronchitis: Secondary | ICD-10-CM | POA: Diagnosis not present

## 2016-08-27 DIAGNOSIS — E559 Vitamin D deficiency, unspecified: Secondary | ICD-10-CM | POA: Diagnosis not present

## 2016-08-27 DIAGNOSIS — J449 Chronic obstructive pulmonary disease, unspecified: Secondary | ICD-10-CM | POA: Diagnosis not present

## 2016-08-27 DIAGNOSIS — Z86718 Personal history of other venous thrombosis and embolism: Secondary | ICD-10-CM | POA: Diagnosis not present

## 2016-08-27 DIAGNOSIS — M6281 Muscle weakness (generalized): Secondary | ICD-10-CM | POA: Diagnosis not present

## 2016-08-27 DIAGNOSIS — Z9981 Dependence on supplemental oxygen: Secondary | ICD-10-CM | POA: Diagnosis not present

## 2016-08-27 DIAGNOSIS — Z7951 Long term (current) use of inhaled steroids: Secondary | ICD-10-CM | POA: Diagnosis not present

## 2016-08-27 DIAGNOSIS — G3184 Mild cognitive impairment, so stated: Secondary | ICD-10-CM | POA: Diagnosis not present

## 2016-08-27 DIAGNOSIS — I482 Chronic atrial fibrillation: Secondary | ICD-10-CM | POA: Diagnosis not present

## 2016-08-27 DIAGNOSIS — E782 Mixed hyperlipidemia: Secondary | ICD-10-CM | POA: Diagnosis not present

## 2016-08-27 DIAGNOSIS — R2689 Other abnormalities of gait and mobility: Secondary | ICD-10-CM | POA: Diagnosis not present

## 2016-08-27 DIAGNOSIS — K219 Gastro-esophageal reflux disease without esophagitis: Secondary | ICD-10-CM | POA: Diagnosis not present

## 2016-08-27 DIAGNOSIS — N184 Chronic kidney disease, stage 4 (severe): Secondary | ICD-10-CM | POA: Diagnosis not present

## 2016-08-27 DIAGNOSIS — D509 Iron deficiency anemia, unspecified: Secondary | ICD-10-CM | POA: Diagnosis not present

## 2016-08-27 DIAGNOSIS — I129 Hypertensive chronic kidney disease with stage 1 through stage 4 chronic kidney disease, or unspecified chronic kidney disease: Secondary | ICD-10-CM | POA: Diagnosis not present

## 2016-08-27 DIAGNOSIS — M25551 Pain in right hip: Secondary | ICD-10-CM | POA: Diagnosis not present

## 2016-08-29 DIAGNOSIS — I482 Chronic atrial fibrillation: Secondary | ICD-10-CM | POA: Diagnosis not present

## 2016-08-29 DIAGNOSIS — D509 Iron deficiency anemia, unspecified: Secondary | ICD-10-CM | POA: Diagnosis not present

## 2016-08-29 DIAGNOSIS — G3184 Mild cognitive impairment, so stated: Secondary | ICD-10-CM | POA: Diagnosis not present

## 2016-08-29 DIAGNOSIS — Z86718 Personal history of other venous thrombosis and embolism: Secondary | ICD-10-CM | POA: Diagnosis not present

## 2016-08-29 DIAGNOSIS — K219 Gastro-esophageal reflux disease without esophagitis: Secondary | ICD-10-CM | POA: Diagnosis not present

## 2016-08-29 DIAGNOSIS — M25551 Pain in right hip: Secondary | ICD-10-CM | POA: Diagnosis not present

## 2016-08-29 DIAGNOSIS — E559 Vitamin D deficiency, unspecified: Secondary | ICD-10-CM | POA: Diagnosis not present

## 2016-08-29 DIAGNOSIS — N184 Chronic kidney disease, stage 4 (severe): Secondary | ICD-10-CM | POA: Diagnosis not present

## 2016-08-29 DIAGNOSIS — E782 Mixed hyperlipidemia: Secondary | ICD-10-CM | POA: Diagnosis not present

## 2016-08-29 DIAGNOSIS — R2689 Other abnormalities of gait and mobility: Secondary | ICD-10-CM | POA: Diagnosis not present

## 2016-08-29 DIAGNOSIS — J449 Chronic obstructive pulmonary disease, unspecified: Secondary | ICD-10-CM | POA: Diagnosis not present

## 2016-08-29 DIAGNOSIS — I129 Hypertensive chronic kidney disease with stage 1 through stage 4 chronic kidney disease, or unspecified chronic kidney disease: Secondary | ICD-10-CM | POA: Diagnosis not present

## 2016-08-29 DIAGNOSIS — Z9981 Dependence on supplemental oxygen: Secondary | ICD-10-CM | POA: Diagnosis not present

## 2016-08-29 DIAGNOSIS — M6281 Muscle weakness (generalized): Secondary | ICD-10-CM | POA: Diagnosis not present

## 2016-08-29 DIAGNOSIS — Z7951 Long term (current) use of inhaled steroids: Secondary | ICD-10-CM | POA: Diagnosis not present

## 2016-09-03 DIAGNOSIS — Z7951 Long term (current) use of inhaled steroids: Secondary | ICD-10-CM | POA: Diagnosis not present

## 2016-09-03 DIAGNOSIS — Z9981 Dependence on supplemental oxygen: Secondary | ICD-10-CM | POA: Diagnosis not present

## 2016-09-03 DIAGNOSIS — G3184 Mild cognitive impairment, so stated: Secondary | ICD-10-CM | POA: Diagnosis not present

## 2016-09-03 DIAGNOSIS — R2689 Other abnormalities of gait and mobility: Secondary | ICD-10-CM | POA: Diagnosis not present

## 2016-09-03 DIAGNOSIS — M6281 Muscle weakness (generalized): Secondary | ICD-10-CM | POA: Diagnosis not present

## 2016-09-03 DIAGNOSIS — N184 Chronic kidney disease, stage 4 (severe): Secondary | ICD-10-CM | POA: Diagnosis not present

## 2016-09-03 DIAGNOSIS — K219 Gastro-esophageal reflux disease without esophagitis: Secondary | ICD-10-CM | POA: Diagnosis not present

## 2016-09-03 DIAGNOSIS — E782 Mixed hyperlipidemia: Secondary | ICD-10-CM | POA: Diagnosis not present

## 2016-09-03 DIAGNOSIS — M25551 Pain in right hip: Secondary | ICD-10-CM | POA: Diagnosis not present

## 2016-09-03 DIAGNOSIS — I129 Hypertensive chronic kidney disease with stage 1 through stage 4 chronic kidney disease, or unspecified chronic kidney disease: Secondary | ICD-10-CM | POA: Diagnosis not present

## 2016-09-03 DIAGNOSIS — Z86718 Personal history of other venous thrombosis and embolism: Secondary | ICD-10-CM | POA: Diagnosis not present

## 2016-09-03 DIAGNOSIS — E559 Vitamin D deficiency, unspecified: Secondary | ICD-10-CM | POA: Diagnosis not present

## 2016-09-03 DIAGNOSIS — I482 Chronic atrial fibrillation: Secondary | ICD-10-CM | POA: Diagnosis not present

## 2016-09-03 DIAGNOSIS — D509 Iron deficiency anemia, unspecified: Secondary | ICD-10-CM | POA: Diagnosis not present

## 2016-09-03 DIAGNOSIS — J449 Chronic obstructive pulmonary disease, unspecified: Secondary | ICD-10-CM | POA: Diagnosis not present

## 2016-09-06 DIAGNOSIS — Z7951 Long term (current) use of inhaled steroids: Secondary | ICD-10-CM | POA: Diagnosis not present

## 2016-09-06 DIAGNOSIS — I482 Chronic atrial fibrillation: Secondary | ICD-10-CM | POA: Diagnosis not present

## 2016-09-06 DIAGNOSIS — N184 Chronic kidney disease, stage 4 (severe): Secondary | ICD-10-CM | POA: Diagnosis not present

## 2016-09-06 DIAGNOSIS — Z9981 Dependence on supplemental oxygen: Secondary | ICD-10-CM | POA: Diagnosis not present

## 2016-09-06 DIAGNOSIS — M25551 Pain in right hip: Secondary | ICD-10-CM | POA: Diagnosis not present

## 2016-09-06 DIAGNOSIS — M6281 Muscle weakness (generalized): Secondary | ICD-10-CM | POA: Diagnosis not present

## 2016-09-06 DIAGNOSIS — I129 Hypertensive chronic kidney disease with stage 1 through stage 4 chronic kidney disease, or unspecified chronic kidney disease: Secondary | ICD-10-CM | POA: Diagnosis not present

## 2016-09-06 DIAGNOSIS — Z86718 Personal history of other venous thrombosis and embolism: Secondary | ICD-10-CM | POA: Diagnosis not present

## 2016-09-06 DIAGNOSIS — J449 Chronic obstructive pulmonary disease, unspecified: Secondary | ICD-10-CM | POA: Diagnosis not present

## 2016-09-06 DIAGNOSIS — D509 Iron deficiency anemia, unspecified: Secondary | ICD-10-CM | POA: Diagnosis not present

## 2016-09-06 DIAGNOSIS — E559 Vitamin D deficiency, unspecified: Secondary | ICD-10-CM | POA: Diagnosis not present

## 2016-09-06 DIAGNOSIS — K219 Gastro-esophageal reflux disease without esophagitis: Secondary | ICD-10-CM | POA: Diagnosis not present

## 2016-09-06 DIAGNOSIS — E782 Mixed hyperlipidemia: Secondary | ICD-10-CM | POA: Diagnosis not present

## 2016-09-06 DIAGNOSIS — R2689 Other abnormalities of gait and mobility: Secondary | ICD-10-CM | POA: Diagnosis not present

## 2016-09-06 DIAGNOSIS — G3184 Mild cognitive impairment, so stated: Secondary | ICD-10-CM | POA: Diagnosis not present

## 2016-09-09 DIAGNOSIS — M6281 Muscle weakness (generalized): Secondary | ICD-10-CM | POA: Diagnosis not present

## 2016-09-09 DIAGNOSIS — I129 Hypertensive chronic kidney disease with stage 1 through stage 4 chronic kidney disease, or unspecified chronic kidney disease: Secondary | ICD-10-CM | POA: Diagnosis not present

## 2016-09-09 DIAGNOSIS — E782 Mixed hyperlipidemia: Secondary | ICD-10-CM | POA: Diagnosis not present

## 2016-09-09 DIAGNOSIS — Z86718 Personal history of other venous thrombosis and embolism: Secondary | ICD-10-CM | POA: Diagnosis not present

## 2016-09-09 DIAGNOSIS — K219 Gastro-esophageal reflux disease without esophagitis: Secondary | ICD-10-CM | POA: Diagnosis not present

## 2016-09-09 DIAGNOSIS — M25551 Pain in right hip: Secondary | ICD-10-CM | POA: Diagnosis not present

## 2016-09-09 DIAGNOSIS — J449 Chronic obstructive pulmonary disease, unspecified: Secondary | ICD-10-CM | POA: Diagnosis not present

## 2016-09-09 DIAGNOSIS — G3184 Mild cognitive impairment, so stated: Secondary | ICD-10-CM | POA: Diagnosis not present

## 2016-09-09 DIAGNOSIS — N184 Chronic kidney disease, stage 4 (severe): Secondary | ICD-10-CM | POA: Diagnosis not present

## 2016-09-09 DIAGNOSIS — D509 Iron deficiency anemia, unspecified: Secondary | ICD-10-CM | POA: Diagnosis not present

## 2016-09-09 DIAGNOSIS — Z9981 Dependence on supplemental oxygen: Secondary | ICD-10-CM | POA: Diagnosis not present

## 2016-09-09 DIAGNOSIS — I482 Chronic atrial fibrillation: Secondary | ICD-10-CM | POA: Diagnosis not present

## 2016-09-09 DIAGNOSIS — Z7951 Long term (current) use of inhaled steroids: Secondary | ICD-10-CM | POA: Diagnosis not present

## 2016-09-09 DIAGNOSIS — R2689 Other abnormalities of gait and mobility: Secondary | ICD-10-CM | POA: Diagnosis not present

## 2016-09-09 DIAGNOSIS — E559 Vitamin D deficiency, unspecified: Secondary | ICD-10-CM | POA: Diagnosis not present

## 2016-09-11 DIAGNOSIS — Z9981 Dependence on supplemental oxygen: Secondary | ICD-10-CM | POA: Diagnosis not present

## 2016-09-11 DIAGNOSIS — J449 Chronic obstructive pulmonary disease, unspecified: Secondary | ICD-10-CM | POA: Diagnosis not present

## 2016-09-11 DIAGNOSIS — I129 Hypertensive chronic kidney disease with stage 1 through stage 4 chronic kidney disease, or unspecified chronic kidney disease: Secondary | ICD-10-CM | POA: Diagnosis not present

## 2016-09-11 DIAGNOSIS — E782 Mixed hyperlipidemia: Secondary | ICD-10-CM | POA: Diagnosis not present

## 2016-09-11 DIAGNOSIS — I482 Chronic atrial fibrillation: Secondary | ICD-10-CM | POA: Diagnosis not present

## 2016-09-11 DIAGNOSIS — M25551 Pain in right hip: Secondary | ICD-10-CM | POA: Diagnosis not present

## 2016-09-11 DIAGNOSIS — R2689 Other abnormalities of gait and mobility: Secondary | ICD-10-CM | POA: Diagnosis not present

## 2016-09-11 DIAGNOSIS — G3184 Mild cognitive impairment, so stated: Secondary | ICD-10-CM | POA: Diagnosis not present

## 2016-09-11 DIAGNOSIS — N184 Chronic kidney disease, stage 4 (severe): Secondary | ICD-10-CM | POA: Diagnosis not present

## 2016-09-11 DIAGNOSIS — Z7951 Long term (current) use of inhaled steroids: Secondary | ICD-10-CM | POA: Diagnosis not present

## 2016-09-11 DIAGNOSIS — K219 Gastro-esophageal reflux disease without esophagitis: Secondary | ICD-10-CM | POA: Diagnosis not present

## 2016-09-11 DIAGNOSIS — D509 Iron deficiency anemia, unspecified: Secondary | ICD-10-CM | POA: Diagnosis not present

## 2016-09-11 DIAGNOSIS — M6281 Muscle weakness (generalized): Secondary | ICD-10-CM | POA: Diagnosis not present

## 2016-09-11 DIAGNOSIS — Z86718 Personal history of other venous thrombosis and embolism: Secondary | ICD-10-CM | POA: Diagnosis not present

## 2016-09-11 DIAGNOSIS — E559 Vitamin D deficiency, unspecified: Secondary | ICD-10-CM | POA: Diagnosis not present

## 2016-09-13 DIAGNOSIS — J449 Chronic obstructive pulmonary disease, unspecified: Secondary | ICD-10-CM | POA: Diagnosis not present

## 2016-09-13 DIAGNOSIS — K219 Gastro-esophageal reflux disease without esophagitis: Secondary | ICD-10-CM | POA: Diagnosis not present

## 2016-09-16 ENCOUNTER — Other Ambulatory Visit: Payer: Self-pay | Admitting: *Deleted

## 2016-09-16 ENCOUNTER — Other Ambulatory Visit: Payer: Self-pay

## 2016-09-16 NOTE — Patient Outreach (Signed)
[facility]) Care Management  09/16/2016  CHICO CAWOOD Feb 16, 1929 [id number]   [name] is an 83 year old gentleman with cognitive impairment and oxygen dependent (2l/[place] continuously) COPD who lives in [place], [place] and was referred by his primary care provider, Dr. Wende Burke, to [facility] for evaluation of home safety/fall risk and medication management and compliance concerns. [name]' granted verbal permission to our telephonic case management team to speak with Kurt Burke, his primary caregiver.   As per our telephonic team's assessment, [name] is assisted by Ms. Kurt Burke M-F from 8am-2pm. [name] and Ms. Kurt Burke agreed to assistance by [facility] to address the following health concerns:   Medication Non-Adherence/Management Needs - [name] and Ms. Kurt Burke indicate that [name] does not take his medications as prescribed. [name] has been referred to our pharmacy team.  Falls - Ms. Kurt Burke states that [name] has had one fall outside his home in the last 3 months. He  Reportedly did not sustain any injury.   COPD Disease Management - as noted, [name] is on home oxygen continuously at 3l/ for management of his COPD. He reports often being short of breath and admits to not taking his COPD medications as prescribed.   No Advanced Directives in [place] - [name] does not have advanced directives and is interested in more information.   Plan:  At the time of receipt of this referral, it is past 2pm when Ms. Kurt Burke is assisting [name]. I will schedule a telephone outreach to [name] tomorrow prior to 2pm, when Ms. Kurt Burke will be present to help [name].   [facility] Management  [phone]

## 2016-09-16 NOTE — Patient Outreach (Signed)
Kurt Burke [facility]) Care Management  09/16/2016  Kurt Burke 07/12/1929 [id number]  TELEPHONE SCREENING Referral date: 09/16/16 Referral source: primary MD referral Referral reason: Cognitive impairment and medication compliance Insurance: [place] healthcare and medicaid  SUBJECTIVE: Telephone call to patient regarding primary MD referral. HIPAA verified with patient. Patient gave verbal authorization to speak with his caregiver, [name] regarding his health information. Caregiver states she is with patient Monday through Friday from 8am to 2 pm.  Discussed and offered [facility] care management services to patient as requested by his primary MD, Dr. Allyn Kenner. Patient verbally agreed to services.  Patient states he is not taking his medications as prescribed. Caregiver states patient does not take his medication as he should.  Caregiver states patient does not like to take his medications.  Caregiver states patient has had 1 fall walking outside of his home without injury within the past 3 months.  Caregiver states patient has COPD and uses oxygen at night at 2 Liters. Caregiver states patient is not taking his COPD medication consistently.  Caregiver states patient does have periods of shortness of breath during the day with activity but not bad enough to use oxygen. Caregiver states patient does have some issues with his memory but not that bad.  Patient states he would like information on Advance Directive.   PROVIDERS:  Dr. Allyn Kenner  SOCIAL/ SUPPORTIVE CARE:  Care giver Monday through Friday 8am to 2pm.  ASSESSMENT:  Patient would benefit from [place] care management assessment and home safety evaluation and [facility] pharmacy follow up due to medication compliance.   PLAN: RNCM will refer patient to [facility] community case manager and pharmacist.   Quinn Plowman RN,BSN,CCM [facility] Telephonic  [phone]

## 2016-09-17 ENCOUNTER — Ambulatory Visit: Payer: Self-pay | Admitting: *Deleted

## 2016-09-23 ENCOUNTER — Telehealth: Payer: Self-pay | Admitting: *Deleted

## 2016-09-23 DIAGNOSIS — J418 Mixed simple and mucopurulent chronic bronchitis: Secondary | ICD-10-CM | POA: Diagnosis not present

## 2016-09-23 NOTE — Patient Outreach (Signed)
[facility]) Care Management  09/23/2016  Kurt Burke 06/07/1929 [id number]   Care coordination   [facility] CM spoke with Kurt Burke to review his referral from [name] and the last contact with [facility] CM in [facility] CM absence Kurt Burke and [facility] CM agreed on home visit for this week with [name] present between 8 am -2 pm  Plans To see Kurt Burke for home visit this week   Joelene Millin L. [name], RN, BSN, [place] Care Management [phone]

## 2016-09-24 ENCOUNTER — Other Ambulatory Visit: Payer: Self-pay | Admitting: *Deleted

## 2016-09-24 NOTE — Patient Outreach (Signed)
[place] [facility]) Care Management   09/24/2016  Kurt Burke [birth year] [id number]  Kurt Burke is an 83 y.o. male with cognitive impairment and oxygen dependent (2l/[place] continuously) COPD who lives in [place], [place] and was referred by his primary care provider, Dr. Wende Neighbors, to [facility] for evaluation of home safety/fall risk and medication management and compliance concerns. Kurt Burke' granted verbal permission to our telephonic case management team to speak with Kurt Burke, his primary caregiver  Subjective: " I  Have notice I do  Not remember as much anymore"  Objective:   Pulse 60   Temp (!) 95.3 F (35.2 C) (Oral)   Resp 20   Ht 1.702 m (_0 )   Wt 172 lb (78 kg)   SpO2 90%   BMI 26.94 kg/m  Review of Systems  HENT: Negative.   Eyes: Negative.   Respiratory: Negative.   Cardiovascular: Negative.   Gastrointestinal: Negative.   Genitourinary: Negative.   Musculoskeletal: Positive for joint pain.  Skin: Negative.   Neurological: Positive for weakness.  Endo/Heme/Allergies: Negative.   Psychiatric/Behavioral: Positive for memory loss.    Physical Exam  Constitutional: He is oriented to person, place, and time. He appears well-developed and well-nourished.  HENT:  Head: Normocephalic and atraumatic.  Eyes: Pupils are equal, round, and reactive to light. Conjunctivae are normal.  Neck: Normal range of motion. Neck supple.  Cardiovascular: Normal rate and regular rhythm.   Respiratory: Effort normal and breath sounds normal.  GI: Soft. Bowel sounds are normal.  Musculoskeletal: Normal range of motion.  Neurological: He is alert and oriented to person, place, and time.  Skin: Skin is warm and dry.  Psychiatric: He has a normal mood and affect. His behavior is normal. Judgment and thought content normal.    Encounter Medications:   Outpatient Encounter Prescriptions as of 09/24/2016  Medication Sig  . carbamide peroxide (DEBROX)  6.5 % otic solution Place 5 drops into both ears 2 (two) times daily as needed (ear wax removal).   Marland Kitchen levalbuterol (XOPENEX) 0.63 MG/3ML nebulizer solution Take 3 mLs (0.63 mg total) by nebulization every 8 (eight) hours as needed for wheezing or shortness of breath.  . levocetirizine (XYZAL) 5 MG tablet Take 5 mg by mouth every evening.  . meclizine (ANTIVERT) 25 MG tablet Take 25 mg by mouth 4 (four) times daily as needed for dizziness.   . metoprolol succinate (TOPROL-XL) 50 MG 24 hr tablet Take 50 mg by mouth daily.  . Omega-3 Fatty Acids (FISH OIL) 1200 MG CPDR Take by mouth.  . tiotropium (SPIRIVA) 18 MCG inhalation capsule Place 18 mcg into inhaler and inhale every evening.   Marland Kitchen ZETIA 10 MG tablet Take 10 mg by mouth daily.   No facility-administered encounter medications on file as of 09/24/2016.     PHQ 2/9 Scores 09/16/2016  PHQ - 2 Score 0    Assessment:   Met with Kurt Burke and [name] at his apartment [facility] CM consent completed Reviewed [facility] CM referral and [facility]N CM role  POA information reviewed  Has meals on wheels Had delivery during home visit   medications found not on the medication list includes dilantin 60 1 capsule once qd acid reflux  Ezetimibe 10 mg 1 tab qd dated 08/21/16  [facility]  Add vit d 3 1000 [address] Potassium cl  20 meq 1 tab qd dated 06/14/16 dr hall Furosemide 40 mg 1 tab qd dr Loma Sousa dated 06/14/16  keatts\nebulizer  salter aire plus  Admits not taking sprivia "I take it when I feel like I need it" Admits he has not taken all xyzal  Had old sprivia from 2016 Admits only takes fish oil for cholesterol,  no Zetia is being taken    Never use hearing aids because he has difficulty keeping them in his ear  Wants to go to [place] in [place], [place] vs returning to [place] Does not know [place] dr name Last time he went to [place] in [place] was over 2 yrs ago   Dr Laural Golden was seen early 2018   Hands bilateral cold     Plan:  THN CM to follow up with Kurt  Kraemer in 2 weeks  THN CM to collaborate with [facility] pharamacist  Route note to pcp    [facility] CM Care Plan Problem One     Most Recent Value  Care Plan Problem One  (P) Home Safety/Fall Risk  Role Documenting the Problem One  (P) Care Management [place] for Problem One  (P) Active  THN Long Term Goal   (P) Over the next 31 days, patient and/or caregiver will verbalize understanding of plan of care for fall risk reduction  THN Long Term Goal Start Date  (P) 09/16/16  Interventions for Problem One Long Term Goal  (P) Emmi educational materials prescribed,  telephone follow up scheduled  THN CM Short Term Goal #1   (P) Over the next 30 days, patient and/or caregiver will work with primary care manager to develop interventions for fall risk reduction  THN CM Short Term Goal #1 Start Date  (P) 09/16/16  Interventions for Short Term Goal #1  (P) Emmi educational tools prescribed    [facility] CM Care Plan Problem Two     Most Recent Value  Care Plan Problem Two  (P) Knowledge Deficits related to chronic health condition (COPD)  Role Documenting the Problem Two  (P) Care Management [place] for Problem Two  (P) Active  Interventions for Problem Two Long Term Goal   (P) Emmi educational tools prescribed  THN Long Term Goal  (P) Over the next 60 days, patient and/or caregiver will verbalize understanding of plan of care for long term self health management of COPD  THN Long Term Goal Start Date  (P) 09/16/16  THN CM Short Term Goal #1   (P) Over the next 30 days, patient and/or caregiver will verbalize basic understanding of COPD diagnosis  THN CM Short Term Goal #1 Start Date  (P) 09/16/16  Interventions for Short Term Goal #2   (P) Emmi educational tools prescribed  THN CM Short Term Goal #2   (P) Over the next 30 days, patient and/or caregiver will verbalize and demonstrate understanding of which prescribed medications are used for treatment of COPD  THN CM Short Term Goal #2  Start Date  (P) 09/16/16  Interventions for Short Term Goal #2  (P) ensured pharmacy referral  THN CM Short Term Goal #3   (P) Over the next 30 days, patient and/or caregiver will verbalize understanding of signs and symptoms of COPD  THN CM Short Term Goal #3 Start Date  (P) 09/16/16  Interventions for Short Term Goal #3  (P) Emmi educational tools prescribed    THN CM Care Plan Problem Three     Most Recent Value  Care Plan Problem Three  (P) Medication Management Concerns  Role Documenting the Problem Three  (P) Care Management Coordinator  Care Plan for Problem Three  (P) Active  THN CM Short Term Goal #1   (P) Over the next 14 days,  patient and/or caregiver will work with [place] pharmacist to address medication management and adherence concerns  THN CM Short Term Goal #1 Start Date  (P) 09/16/16  Interventions for Short Term Goal #1  (P) ensured pharmacy referral      [place]. [name], RN, BSN, [place] Care Management [phone]

## 2016-09-30 ENCOUNTER — Other Ambulatory Visit: Payer: Self-pay | Admitting: *Deleted

## 2016-09-30 ENCOUNTER — Other Ambulatory Visit: Payer: Self-pay | Admitting: Pharmacist

## 2016-09-30 NOTE — Patient Outreach (Signed)
Kurt Burke) Care Management  09/30/2016  Kurt Burke 11/08/1929 [id number]   Care coordination [facility]N Cm contacted by [facility] Pharmacist to collaborate on his medications [facility] CM discussed Kurt Burke attentive, talkative behavior until [facility] CM began to check his medications during Kurt initial home visit When CM discussed need to check his medications Kurt Burke was noted to begin to pace from his recliner in Kurt living room to his bedroom.  His care giver Ms Kurt Burke reports he is very concerned about his medications.  Kurt Burke informed Kurt Burke CM that she could check his medications but his aide, Ms Kurt Burke would have to be there with him and he would have to be able to see both Ms Kurt Burke and Kurt Burke CM reviewed medication bottles on his counter in Kurt kitchen and showed him each medication bottle was checked and placed back as he had it on Kurt counter,  THN CM noted some of Kurt medication bottles had been turned with Kurt lids down  Kurt [facility] CM reviewed Kurt medications on Kurt table with Ms Kurt Burke.  THN CM asked if there were other medications or supplements in Kurt home and was informed no except for Kurt spiriva on Kurt table bedside Kurt Burke.  He indicated to Kurt Burke that he did not like taking Spiriva and only took it if he needed to.  He would not elaborate on Kurt explanation of why he did not like Kurt medication  Discussed possible a home visit with Kurt Burke pharmacist  Kurt Burke CM stopped by Kurt Burke home on 09/27/16 after calling him and briefly to provide him with a copy of a living will packet to encourage and allow him time to review and to speak with his niece about his wishes He voiced appreciation Ms Kurt Burke present   Plans Kurt Burke will be seen on next week for a follow up home visit  [name]. Kurt Hamman, RN, BSN, [place] Care Management [phone]

## 2016-09-30 NOTE — Patient Outreach (Signed)
[place] [facility] Care Management  09/30/2016  Kurt Burke 09-29-1929 [id number]  Referral received from [place] for medication compliance concerns per patient's PCP.    Successful phone outreach to patient's aide, Kurt Burke, on [facility] Consent form and HIPAA details verified.  Kurt Burke placed patient on phone.    [facility] Pharmacist explained purpose of call to patient was to determine how to help patient manage his medications.  Patient reports he doesn't like to take much medication and did not elaborate on why he doesn't like to take his medications.    [facility] Pharmacist placed prior call to [place] and collaborated on case---agreed to a co-visit next week if patient consented.    Discussed with patient, could make a home visit to him to review his medications with him----he accepted.   Plan:  Home visit scheduled with [facility] RN Kurt Burke.   Karrie Meres, PharmD, [place] [phone]

## 2016-10-08 ENCOUNTER — Other Ambulatory Visit: Payer: Self-pay | Admitting: *Deleted

## 2016-10-08 ENCOUNTER — Other Ambulatory Visit: Payer: Self-pay | Admitting: Pharmacist

## 2016-10-08 NOTE — Patient Outreach (Signed)
[facility]) Care Management  [place]   10/08/2016  Kurt Burke March 23, 1929 [id number]  Subjective:  Patient was referred to [place] Pharmacist for concerns with medication adherence.   Home visit completed with patient in coordination with [facility] RN [name].   Patient reports he takes his medications from the counter where he has his bottles lined up---he does not use a pill box and doesn't wish to at this time.  He states he uses [employer] and receives his medications via delivery.    He reports he is not taking tiotropium (Spiriva) routinely.  He has an aide who is with him during the week and she was also present during home visit.    Objective:   Encounter Medications: Outpatient Encounter Prescriptions as of 10/08/2016  Medication Sig Note  . carbamide peroxide (DEBROX) 6.5 % otic solution Place 5 drops into both ears 2 (two) times daily as needed (ear wax removal).    Marland Kitchen levalbuterol (XOPENEX) 0.63 MG/3ML nebulizer solution Take 3 mLs (0.63 mg total) by nebulization every 8 (eight) hours as needed for wheezing or shortness of breath. 09/24/2016: Does not use as frequently as he should, not every day  . levocetirizine (XYZAL) 5 MG tablet Take 5 mg by mouth every evening.   . meclizine (ANTIVERT) 25 MG tablet Take 25 mg by mouth 4 (four) times daily as needed for dizziness.    . metoprolol succinate (TOPROL-XL) 50 MG 24 hr tablet Take 50 mg by mouth daily.   . Omega-3 Fatty Acids (FISH OIL) 1200 MG CPDR Take by mouth.   . tiotropium (SPIRIVA) 18 MCG inhalation capsule Place 18 mcg into inhaler and inhale every evening.    Marland Kitchen ZETIA 10 MG tablet Take 10 mg by mouth daily.    No facility-administered encounter medications on file as of 10/08/2016.     Functional Status: In your present state of health, do you have any difficulty performing the following activities: 09/24/2016  Hearing? Y  Vision? Y  Difficulty concentrating or making  decisions? Y  Walking or climbing stairs? N  Dressing or bathing? N  Doing errands, shopping? Y  Preparing Food and eating ? N  Using the Toilet? N  In the past six months, have you accidently leaked urine? N  Do you have problems with loss of bowel control? N  Managing your Medications? Y  Managing your Finances? Y  Housekeeping or managing your Housekeeping? Y  Some recent data might be hidden    Fall/Depression Screening: No flowsheet data found. PHQ 2/9 Scores 09/16/2016  PHQ - 2 Score 0      Assessment:  Medication review per patient bottles in his possession, medication list in this chart.   He was encouraged to take his medications as prescribed by his prescriber.    Drugs sorted by system:  Cardiovascular: -ezetimibe  -metoprolol succinate -omega 3 fatty acid (OTC)  -furosemide -potassium chloride   Pulmonary/Allergy: -levalbuterol nebs---reports he does not take every day -tiotropium (Spiriva) handihaler   Gastrointestinal: -dexlansoprazole   Topical: -carbamide peroxide (Debrox)  Vitamins/Minerals: -vitamin D   Miscellaneous: -meclizine---reports he does not take every day. Appears this was refilled monthly despite patient not using it daily.    Medications to avoid in the elderly:  Meclizine---increased risk of falls and ADRs   Other issues noted:  1) levocetirizine was on medication list---he reports not taking in past 30 days and did not have---it was removed from medication list  2) dexlansoprazole, furosemide, potassium chloride, vitamin D---were not on medication, he had bottles and reports taking---they were added to medication list  3) he reports using albuterol inhaler as needed---it is not on medication list and he had 3 albuterol inhalers with expiration dates of 2015-2016.    4) patient reports he doesn't like Spiriva Handihaler with the capsule to inhale but he would be willing to try a different inhaler if MD prescribed one.     Plan:  1) Placed call to [place] to request they take meclizine off of auto refill with patient's consent.   2) Will follow-up with PCP, [name] regarding patient's inhalers.    3) Note with medication review will be routed to PCP.    4) Will follow-up with patient in the next 2 weeks---if patient is provided a different inhaler, anticipate patient will need a home visit for inhaler technique teaching.    Karrie Meres, PharmD, [place] [phone]

## 2016-10-08 NOTE — Patient Outreach (Signed)
[facility]) Care Management   10/08/2016  AIDYN KELLIS 07-Oct-1929 [id number]   Subjective:   Objective:   BP 106/60   Pulse 74   Temp (!) 96.8 F (36 C) (Oral)   Resp 20   Ht 1.727 m (_0 )   SpO2 94%   Review of Systems  HENT: Negative.   Eyes: Negative.   Respiratory: Negative.   Cardiovascular: Negative.   Gastrointestinal: Negative.   Genitourinary: Negative.   Skin: Negative.   Neurological: Positive for weakness.  Endo/Heme/Allergies: Negative.   Psychiatric/Behavioral: Negative.     Physical Exam  Constitutional: He is oriented to person, place, and time. He appears well-developed and well-nourished.  HENT:  Head: Normocephalic and atraumatic.  Eyes: Pupils are equal, round, and reactive to light. Conjunctivae are normal.  Neck: Normal range of motion. Neck supple.  Cardiovascular: Normal rate and normal heart sounds.   GI: Soft. Bowel sounds are normal.  Musculoskeletal: Normal range of motion.  Neurological: He is alert and oriented to person, place, and time.  Skin: Skin is warm and dry.  Psychiatric: He has a normal mood and affect. His behavior is normal. Judgment and thought content normal.    Encounter Medications:   Outpatient Encounter Prescriptions as of 10/08/2016  Medication Sig Note  . carbamide peroxide (DEBROX) 6.5 % otic solution Place 5 drops into both ears 2 (two) times daily as needed (ear wax removal).    Marland Kitchen levalbuterol (XOPENEX) 0.63 MG/3ML nebulizer solution Take 3 mLs (0.63 mg total) by nebulization every 8 (eight) hours as needed for wheezing or shortness of breath. 09/24/2016: Does not use as frequently as he should, not every day  . levocetirizine (XYZAL) 5 MG tablet Take 5 mg by mouth every evening.   . meclizine (ANTIVERT) 25 MG tablet Take 25 mg by mouth 4 (four) times daily as needed for dizziness.    . metoprolol succinate (TOPROL-XL) 50 MG 24 hr tablet Take 50 mg by mouth daily.   . Omega-3 Fatty Acids  (FISH OIL) 1200 MG CPDR Take by mouth.   . tiotropium (SPIRIVA) 18 MCG inhalation capsule Place 18 mcg into inhaler and inhale every evening.    Marland Kitchen ZETIA 10 MG tablet Take 10 mg by mouth daily.    No facility-administered encounter medications on file as of 10/08/2016.     Functional Status:   In your present state of health, do you have any difficulty performing the following activities: 09/24/2016  Hearing? Y  Vision? Y  Difficulty concentrating or making decisions? Y  Walking or climbing stairs? N  Dressing or bathing? N  Doing errands, shopping? Y  Preparing Food and eating ? N  Using the Toilet? N  In the past six months, have you accidently leaked urine? N  Do you have problems with loss of bowel control? N  Managing your Medications? Y  Managing your Finances? Y  Housekeeping or managing your Housekeeping? Y  Some recent data might be hidden    Fall/Depression Screening:    No flowsheet data found. PHQ 2/9 Scores 09/16/2016  PHQ - 2 Score 0    Assessment:    THN pharmacist and [facility] CM met with Mr New[place] and [name]  Medications Pharmacist present and discussed stopping auto refill of meclizine to work on recommendations for Spiriva inhaler by calling [name] office. Mr Velazquez is very vocal about not changing any of his medicines unless [name] says the medication needs to be changed  Mr Michaux has completed his advanced directives forms with his niece this am He had them motorized and he made copies. Again discussed taking only a copy of living will to hospital for visits and provider offices  Mr Ishida received his meals on wheels meal during home visit  Legs looking better today No falls since last visit COPD Likes his home very warm in am per aide [employer] services Oxygen at intervals per aide and uses oxygen only at night   Pt noted to nod and doze during home visit    BP cuff was ordered by aide to be delivered soon  They are awaiting to place order in  October 2018 form [facility]    Plan: [facility] CM made contact with [name] office for request for assistance with possible different inhaler other than Spiriva [facility] CM with patient called the [facility] in [place] [phone] to get the name of Mr Tolen' [place]  but referred by the operator to the [facility] office at [phone] related to the fact that Mr Marchuk had not been seen at the [facility] center. Provided with the [facility] address as [place], [place], [place] [address]  [facility] CM with patient called the [facility] in [place] [phone] to try to get assist with name of [place] and to see if availability or the process to get connected with the [facility] office - spoke with Ruby in the [place] call center primary confirmed he was never assigned a [facility] pcp THN Cm was informed a  consult would have to be put in at the [facility] to setup up a pcp appt and when [facility] calls Mr Sauseda he would then need to tell them he wants to go to [place] entered in a consult for a VA pcp for [facility] so Mr [place] can get [facility] as his [place] home and a [employer] [place] pcp and He will receive a call to verified his home address and telephone number on next Thursday 8a -1 pm  Per Ruby Route note to pcp ant other medical providers listed on [facility]  Care team  [place]. [name], RN, BSN, [place] Care Management [phone]

## 2016-10-09 ENCOUNTER — Other Ambulatory Visit: Payer: Self-pay | Admitting: Pharmacist

## 2016-10-09 NOTE — Patient Outreach (Signed)
[facility] [facility]) Care Management  10/09/2016  Kurt Burke [birth year] [id number]  Phone call to Dr Juel Burrow office regarding home visit yesterday with patient.   Spoke with Caryl Pina regarding:  1) patient using expired albuterol inhaler and needs new prescription if [name] wishes patient to have access to rescue inhaler.   2) patient reports he prefers not to use tiotropium (Spiriva) Handihaler capsule inhalation. He agreed to try a different inhaler if PCP wants to prescriber an alternative.   Caryl Pina reports she will get message to [name] and follow-up with [facility] Pharmacist.   Plan:  Continue to follow-up with patient.   Karrie Meres, PharmD, [place] [phone]

## 2016-10-10 ENCOUNTER — Other Ambulatory Visit: Payer: Self-pay | Admitting: Pharmacist

## 2016-10-10 NOTE — Patient Outreach (Signed)
[place] [facility]) Care Management  10/10/2016  GRADY MOHABIR 1929-03-27 [id number]  Incoming call received from [place], with [name], patient's PCP.  Caryl Pina states [name] is going to issue prescription for Anoro as an alternative to Spiriva.   Anoro does appear to be on his insurance formulary per preferred drug list on plan website.    Placed call to patient, his aide [name] answered phone, on [facility] Consent form.  She placed patient on phone, HIPAA details verified.  Updated patient [name] is issuing new inhaler prescription.  Asked patient to contact [facility] Pharmacist when he receives inhaler so home visit can be scheduled to teach him how to use inhaler device.  Patient placed [name] back on phone and requested she be spoken with.    Requested [name] ensure patient contacts [facility] Pharmacist when he receives Anoro so home visit can be scheduled.  She was provided with [facility] Pharmacist phone number.   Plan:  Follow-up with patient as scheduled.   Karrie Meres, PharmD, [place] [phone]

## 2016-10-14 ENCOUNTER — Other Ambulatory Visit: Payer: Self-pay | Admitting: Pharmacist

## 2016-10-14 NOTE — Patient Outreach (Signed)
[place] [facility]) Care Management  10/14/2016  JEANPIERRE THEBEAU 26-Nov-1929 [id number]  Successful phone outreach to patient's caregiver, [name], on [facility] Consent form.  HIPAA verified.    [name] reports patient did not receive Anoro from his pharmacy yet.    Call placed to [facility], staff reports not received prescription from [name].    Call placed to Dr Juel Burrow office, spoke with Caryl Pina, whom reports prescription was e-prescribed 10/10/16 to [facility].    Call back to [facility]---staff now report Anoro was filled 10/10/16, $0 co-pay, and is ready for pick-up.   Call placed back to patient, spoke with Shela Leff, caregiver---informed patient's prescription for Anoro is ready at [facility].   Plan:  Home visit scheduled to complete inhaler device education with patient.   Karrie Meres, PharmD, [place] [phone]

## 2016-10-16 ENCOUNTER — Other Ambulatory Visit: Payer: Self-pay | Admitting: Pharmacist

## 2016-10-16 NOTE — Patient Outreach (Signed)
[facility]) Care Management  [place]   10/16/2016  CARLA WHILDEN [birth year] [id number]  Subjective:  Home visit completed with patient.  His aide was also present during home visit.   Purpose of home visit was to provide inhaler device teaching for patient with Anoro, which [name] prescribed in place of Spiriva due to fact patient did not like Spiriva delivery device.   Patient had inhaler delivered from his pharmacy but had not started to use.   Objective:   Encounter Medications: Outpatient Encounter Prescriptions as of 10/16/2016  Medication Sig Note  . carbamide peroxide (DEBROX) 6.5 % otic solution Place 5 drops into both ears 2 (two) times daily as needed (ear wax removal).    . cholecalciferol (VITAMIN D) 1000 units tablet Take 1,000 Units by mouth daily.   Marland Kitchen dexlansoprazole (DEXILANT) 60 MG capsule Take 60 mg by mouth daily.   . furosemide (LASIX) 40 MG tablet Take 40 mg by mouth daily.   Marland Kitchen levalbuterol (XOPENEX) 0.63 MG/3ML nebulizer solution Take 3 mLs (0.63 mg total) by nebulization every 8 (eight) hours as needed for wheezing or shortness of breath. 09/24/2016: Does not use as frequently as he should, not every day  . meclizine (ANTIVERT) 25 MG tablet Take 25 mg by mouth 4 (four) times daily as needed for dizziness.  10/09/2016: Patient reports not taking everyday  . metoprolol succinate (TOPROL-XL) 50 MG 24 hr tablet Take 50 mg by mouth daily.   . Omega-3 Fatty Acids (FISH OIL) 1200 MG CPDR Take 1 capsule by mouth daily.    . potassium chloride SA (K-DUR,KLOR-CON) 20 MEQ tablet Take 20 mEq by mouth daily.    Marland Kitchen tiotropium (SPIRIVA) 18 MCG inhalation capsule Place 18 mcg into inhaler and inhale every evening.    Marland Kitchen ZETIA 10 MG tablet Take 10 mg by mouth daily.    No facility-administered encounter medications on file as of 10/16/2016.     Functional Status: In your present state of health, do you have any difficulty performing the following  activities: 09/24/2016  Hearing? Y  Vision? Y  Difficulty concentrating or making decisions? Y  Walking or climbing stairs? N  Dressing or bathing? N  Doing errands, shopping? Y  Preparing Food and eating ? N  Using the Toilet? N  In the past six months, have you accidently leaked urine? N  Do you have problems with loss of bowel control? N  Managing your Medications? Y  Managing your Finances? Y  Housekeeping or managing your Housekeeping? Y  Some recent data might be hidden    Fall/Depression Screening: No flowsheet data found. PHQ 2/9 Scores 09/16/2016  PHQ - 2 Score 0      Assessment:  Patient counseling:  Discussed Anoro inhaler, including common side effects, how the medication works to open and relax his airways so he can breathe better.  Discussed expiration once inhaler is opened and removed from packaging.  Discussed dosing of one puff once daily.   Discussed it is not a rescue inhaler and his rescue inhaler is still albuterol or his levalbuterol nebs.    Discussed how to use inhaler, including loading dose, not covering the vents on the inhaler, and inhaling dose with a long, steady, deep breathe.   Patient was counseled to not open cover on inhaler until he is ready to use dose as doing so loads the dose.    Patient was able to demonstrate how to use inhaler during home visit.  Plan:  Follow-up call to patient in next 2 weeks to see how he is doing with inhaler and if he has further pharmacy related needs.   [facility] CM Care Plan Problem One     Most Recent Value  Care Plan Problem One  Inhaler adherence  Role Documenting the Problem One  Clinical Pharmacist  Care Plan for Problem One  Active  THN CM Short Term Goal #1   Patient to take inhaler daily as directed and as indicated by dose counter on device  THN CM Short Term Goal #1 Start Date  10/16/16  Interventions for Short Term Goal #1  provided counseling on inhaler technique    Karrie Meres, PharmD,  [place] [phone]

## 2016-10-22 ENCOUNTER — Other Ambulatory Visit: Payer: Self-pay | Admitting: *Deleted

## 2016-10-22 NOTE — Patient Outreach (Signed)
[facility]) Care Management   10/22/2016  Kurt Burke 09/03/1929 [id number]   Subjective:   Objective:   BP 102/68   Pulse 76   Temp (!) 96 F (35.6 C) (Axillary)   Resp 20   SpO2 92%   Review of Systems  Constitutional: Negative for chills, diaphoresis, fever, malaise/fatigue and weight loss.  HENT: Negative for congestion, ear discharge, ear pain, hearing loss, nosebleeds, sinus pain, sore throat and tinnitus.   Eyes: Negative for blurred vision, double vision, photophobia, pain, discharge and redness.  Respiratory: Negative for cough, hemoptysis, sputum production, shortness of breath, wheezing and stridor.   Cardiovascular: Negative.  Negative for chest pain, palpitations, orthopnea, claudication, leg swelling and PND.  Gastrointestinal: Negative.  Negative for abdominal pain, blood in stool, constipation, diarrhea, heartburn, melena, nausea and vomiting.  Genitourinary: Negative.  Negative for dysuria, flank pain, frequency, hematuria and urgency.  Musculoskeletal: Negative for back pain, falls, joint pain, myalgias and neck pain.  Skin: Positive for itching.       Left forearm area itches at intervals and uses gold bond with relief  Neurological: Positive for weakness and headaches. Negative for dizziness, tingling, tremors, sensory change, speech change, focal weakness, seizures and loss of consciousness.       Some times awaken with headaches   Endo/Heme/Allergies: Negative.  Negative for environmental allergies and polydipsia. Does not bruise/bleed easily.  Psychiatric/Behavioral: Positive for memory loss. Negative for depression, hallucinations, substance abuse and suicidal ideas. The patient is not nervous/anxious and does not have insomnia.     Physical Exam  Constitutional: He appears well-developed and well-nourished.  HENT:  Head: Normocephalic and atraumatic.  Eyes: Pupils are equal, round, and reactive to light. Conjunctivae are normal.   Neck: Normal range of motion. Neck supple.  Cardiovascular: Normal rate and regular rhythm.   Respiratory: Effort normal and breath sounds normal.  GI: Soft. Bowel sounds are normal.  Musculoskeletal: Normal range of motion.  Neurological: He is alert.  Skin: Skin is warm and dry.  Psychiatric: He has a normal mood and affect. His behavior is normal. Judgment and thought content normal.    Encounter Medications:   Outpatient Encounter Prescriptions as of 10/22/2016  Medication Sig Note  . carbamide peroxide (DEBROX) 6.5 % otic solution Place 5 drops into both ears 2 (two) times daily as needed (ear wax removal).    . cholecalciferol (VITAMIN D) 1000 units tablet Take 1,000 Units by mouth daily.   Marland Kitchen dexlansoprazole (DEXILANT) 60 MG capsule Take 60 mg by mouth daily.   . furosemide (LASIX) 40 MG tablet Take 40 mg by mouth daily.   Marland Kitchen levalbuterol (XOPENEX) 0.63 MG/3ML nebulizer solution Take 3 mLs (0.63 mg total) by nebulization every 8 (eight) hours as needed for wheezing or shortness of breath. 09/24/2016: Does not use as frequently as he should, not every day  . meclizine (ANTIVERT) 25 MG tablet Take 25 mg by mouth 4 (four) times daily as needed for dizziness.  10/09/2016: Patient reports not taking everyday  . metoprolol succinate (TOPROL-XL) 50 MG 24 hr tablet Take 50 mg by mouth daily.   . Omega-3 Fatty Acids (FISH OIL) 1200 MG CPDR Take 1 capsule by mouth daily.    . potassium chloride SA (K-DUR,KLOR-CON) 20 MEQ tablet Take 20 mEq by mouth daily.    Marland Kitchen umeclidinium-vilanterol (ANORO ELLIPTA) 62.5-25 MCG/INH AEPB Inhale 1 puff into the lungs daily.   Marland Kitchen ZETIA 10 MG tablet Take 10 mg by mouth daily.  No facility-administered encounter medications on file as of 10/22/2016.     Functional Status:   In your present state of health, do you have any difficulty performing the following activities: 09/24/2016  Hearing? Y  Vision? Y  Difficulty concentrating or making decisions? Y  Walking  or climbing stairs? N  Dressing or bathing? N  Doing errands, shopping? Y  Preparing Food and eating ? N  Using the Toilet? N  In the past six months, have you accidently leaked urine? N  Do you have problems with loss of bowel control? N  Managing your Medications? Y  Managing your Finances? Y  Housekeeping or managing your Housekeeping? Y  Some recent data might be hidden    Fall/Depression Screening:    No flowsheet data found. PHQ 2/9 Scores 09/16/2016  PHQ - 2 Score 0    Assessment:   Met with Kurt Burke and his PCS Mrs Jerelene Redden at his apartment He is sitting in his recliner Pleasant and joking a lot today  Medication-Anora has been obtained but he is not taking daily as ordered Reports taking anora "when he fells like it" on 10/22/16 He had not taken Anora at the time of [facility] CM home visit.  He reports not remembering when to take it  When asked why not taking "I don't know.  I thought I had taken it" [name] recalls his pcp mentioned " a box" medicine system.He agreed to work with [name] and [facility] CM to use the [name] box system Last saw [name] in July 18 2016 will not see until December 2018  Kurt Burke voiced concern with cost of use of [name] box and states " There is always a way people get your money."  [facility] CM shared with Kurt Burke and [name] Pictures and a 8 page written copy of information about the [name] box to review with his niece.  The cost on the information sheet states cost is $59 per month and Kurt Burke is on a fixed income. He wishes to "due a trial and see how I feel"   Falls - fell once in Summer of 2018 going to check on his satellite per pcs staff but not since "he was being nosy" per [name]  bp cuff came to the home as ordered - [name] reports a systolic BP of 99 x 1 but Kurt Burke denied dizziness and was encouraged to drink fluids by Melene Plan aide   [facility] appt [name] confirmed [facility] staff called and offered [facility] MD appt -will cancel and get  Jule Ser appt   POA information completed   No home health services Only seen by Anderson Malta, RN from [facility] at intervals and a SW at intervals  Shared with him [place] trot flyer  [employer] visited during the home visit Flu shot not received yet Kurt Marchiano states he will get flu shot from pcp at next appt  Plan:  [place] benefit program ans spoke with Timmothy Sours to confirm Kurt Isip has 69 credits He was ordered cough syrup, bandaids and a digital talking scale He has 866 credits left that need to be used prior to 01/06/17   [phone] called and [facility]N CM spoke with Elberta Fortis to cancel the [facility] 12/11/16 10 am appointment with dr Jettie Booze and was informed [facility] CM would have to call [place] [phone] option 2, 2  spoke with Kurt Mauro Kaufmann who instructed [facility] to speak with staff in [place] eligible department at [phone] x [phone] or [phone]  To set up first initial appt with next available primary provider as soon as possible Kurt Mauro Kaufmann transferred [facility] CM to [place] and [place] spoke with Demetrius Charity who reports prior to an appointment Kurt Orman need to submit a financial 2017-18 statement after getting a copy of the [facility] 10-10 EZR form off line then fax to [phone] ATTN ELIGIBILITY  [phone] Spoke with Jamilla to confirm fax of 3 pages received to [facility] for 10-10 EZR and Samella Parr will give fax to [facility] for processing and Kurt Lesesne will be contacted.  This follow up information along with the faxed forms, confirmation was given to [name] to share with his niece and to have to take with him for his upcoming [place] discussed his upcoming [place] follow up for 10/28/16- Anora THN CM to continue to collaborate with [place] and pcp on medications, trial of Jon box Route note to pcp and all pertinent EPIC medical providers on care team Follow up with Kurt Guzzetta in 2 weeks   [facility] CM  Care Plan Problem One     Most Recent Value  Care Plan Problem One  (P) Home Safety/Fall Risk  Role Documenting the Problem One  (P) Care Management [place] for Problem One  (P) Active  THN Long Term Goal   (P) Over the next 31 days, patient and/or caregiver will verbalize understanding of plan of care for fall risk reduction  THN Long Term Goal Start Date  (P) 09/16/16  Interventions for Problem One Long Term Goal  (P) Emmi educational materials prescribed,  telephone follow up scheduled  THN CM Short Term Goal #1   (P) Over the next 30 days, patient and/or caregiver will work with primary care manager to develop interventions for fall risk reduction  THN CM Short Term Goal #1 Start Date  (P) 09/16/16  Interventions for Short Term Goal #1  (P) Emmi educational tools prescribed    [facility] CM Care Plan Problem Two     Most Recent Value  Care Plan Problem Two  (P) Knowledge Deficits related to chronic health condition (COPD)  Role Documenting the Problem Two  (P) Care Management [place] for Problem Two  (P) Active  Interventions for Problem Two Long Term Goal   (P) Emmi educational tools prescribed  THN Long Term Goal  (P) Over the next 60 days, patient and/or caregiver will verbalize understanding of plan of care for long term self health management of COPD  THN Long Term Goal Start Date  (P) 09/16/16  THN CM Short Term Goal #1   (P) Over the next 30 days, patient and/or caregiver will verbalize basic understanding of COPD diagnosis  THN CM Short Term Goal #1 Start Date  (P) 09/16/16  Interventions for Short Term Goal #2   (P) Emmi educational tools prescribed  THN CM Short Term Goal #2   (P) Over the next 30 days, patient and/or caregiver will verbalize and demonstrate understanding of which prescribed medications are used for treatment of COPD  THN CM Short Term Goal #2 Start Date  (P) 09/16/16  Interventions for Short Term Goal #2  (P) ensured pharmacy referral   THN CM Short Term Goal #3   (P) Over the next 30 days, patient and/or caregiver will verbalize  understanding of signs and symptoms of COPD  THN CM Short Term Goal #3 Start Date  (P) 09/16/16  Interventions for Short Term Goal #3  (P) Emmi educational tools prescribed    THN CM Care Plan Problem Three     Most Recent Value  Care Plan Problem Three  (P) Medication Management Concerns  Role Documenting the Problem Three  (P) Care Management Coordinator  Care Plan for Problem Three  (P) Active  THN CM Short Term Goal #1   (P) Over the next 14 days,  patient and/or caregiver will work with [place] pharmacist to address medication management and adherence concerns  THN CM Short Term Goal #1 Start Date  (P) 09/16/16  Interventions for Short Term Goal #1  (P) ensured pharmacy referral      Fonda. [name], RN, BSN, [place] Care Management [phone]

## 2016-10-23 DIAGNOSIS — J418 Mixed simple and mucopurulent chronic bronchitis: Secondary | ICD-10-CM | POA: Diagnosis not present

## 2016-10-28 ENCOUNTER — Other Ambulatory Visit: Payer: Self-pay | Admitting: Pharmacist

## 2016-10-28 NOTE — Patient Outreach (Signed)
[facility]) Care Management  10/28/2016  Kurt Burke 09/14/1929 [id number]  Successful phone outreach to patient's aide, [name], on [facility] Consent form.  HIPAA details verified.   [name] reports patient has not been taking Anoro daily.  She reports dose counter on inhaler reads 25 doses remaining.  Patient used first dose on 10/16/16,  He has used 5 doses in past 12 days.    [name] placed patient on phone.  Patient reports he has been taking inhaler daily.  Discussed with patient based on doses remaining, he is missing doses.  Patient then reported he "doesn't think he needs it."    Discussed with patient he will need to discuss with his PCP use of inhaler if he feels he doesn't need it.  Discussed Anoro works when taken daily and is not a rescue or prn inhaler.    Patient reports he will try to do better.  He has been counseled on purpose of Anoro and was shown how to use Anoro at 10/16/16 home visit.     Plan:  Will make follow-up call in next 2 weeks regarding inhaler adherence.     THN CM Care Plan Problem One     Most Recent Value  Care Plan Problem One  Inhaler adherence  Role Documenting the Problem One  Clinical Pharmacist  Care Plan for Problem One  Active  THN CM Short Term Goal #1   Patient to take at least 80% of inhaler doses as directed and as indicated by dose counter on device over next 2 weeks  THN CM Short Term Goal #1 Start Date  10/28/16  Interventions for Short Term Goal #1  patient was counseled on importance of inhaler and difference between rescue and maintenance inhaler      Karrie Meres, PharmD, [place] [phone]

## 2016-11-05 ENCOUNTER — Other Ambulatory Visit: Payer: Self-pay | Admitting: *Deleted

## 2016-11-05 NOTE — Patient Outreach (Signed)
[facility] ([facility]) Care Management   11/05/2016  Drew E Icenhour 08/23/1929 [id number]  Kurt Burke is an 83 y.o. male  Subjective:   Objective:   Review of Systems  HENT: Negative.   Eyes: Negative.   Respiratory: Positive for shortness of breath. Negative for cough and sputum production.   Cardiovascular: Negative.  Negative for chest pain and leg swelling.  Gastrointestinal: Negative.   Genitourinary: Negative.   Musculoskeletal: Negative.   Skin: Negative.   Neurological: Positive for weakness. Negative for dizziness, tingling, tremors, sensory change, speech change, focal weakness, seizures, loss of consciousness and headaches.  Endo/Heme/Allergies: Negative.   Psychiatric/Behavioral: Positive for memory loss.    Physical Exam  Constitutional: He is oriented to person, place, and time. He appears well-developed and well-nourished.  HENT:  Head: Normocephalic and atraumatic.  Eyes: Conjunctivae are normal. Pupils are equal, round, and reactive to light.  Neck: Normal range of motion. Neck supple.  Cardiovascular: Normal rate and regular rhythm.  Respiratory: Effort normal and breath sounds normal.  GI: Soft. Bowel sounds are normal.  Musculoskeletal: Normal range of motion.  Neurological: He is alert and oriented to person, place, and time.  Skin: Skin is warm and dry.  Psychiatric: He has a normal mood and affect. His behavior is normal. Judgment and thought content normal.    Encounter Medications:   Outpatient Encounter Prescriptions as of 11/05/2016  Medication Sig Note  . carbamide peroxide (DEBROX) 6.5 % otic solution Place 5 drops into both ears 2 (two) times daily as needed (ear wax removal).    . cholecalciferol (VITAMIN D) 1000 units tablet Take 1,000 Units by mouth daily.   . dexlansoprazole (DEXILANT) 60 MG capsule Take 60 mg by mouth daily.   . furosemide (LASIX) 40 MG tablet Take 40 mg by mouth daily.   . levalbuterol (XOPENEX) 0.63  MG/3ML nebulizer solution Take 3 mLs (0.63 mg total) by nebulization every 8 (eight) hours as needed for wheezing or shortness of breath. 09/24/2016: Does not use as frequently as he should, not every day  . meclizine (ANTIVERT) 25 MG tablet Take 25 mg by mouth 4 (four) times daily as needed for dizziness.  10/09/2016: Patient reports not taking everyday  . metoprolol succinate (TOPROL-XL) 50 MG 24 hr tablet Take 50 mg by mouth daily.   . Omega-3 Fatty Acids (FISH OIL) 1200 MG CPDR Take 1 capsule by mouth daily.    . potassium chloride SA (K-DUR,KLOR-CON) 20 MEQ tablet Take 20 mEq by mouth daily.    . umeclidinium-vilanterol (ANORO ELLIPTA) 62.5-25 MCG/INH AEPB Inhale 1 puff into the lungs daily. 10/22/2016: Reports taking anora "when he fells like it" on 10/22/16 He had not taken Anora  . ZETIA 10 MG tablet Take 10 mg by mouth daily.    No facility-administered encounter medications on file as of 11/05/2016.     Functional Status:   In your present state of health, do you have any difficulty performing the following activities: 09/24/2016  Hearing? Y  Vision? Y  Difficulty concentrating or making decisions? Y  Walking or climbing stairs? N  Dressing or bathing? N  Doing errands, shopping? Y  Preparing Food and eating ? N  Using the Toilet? N  In the past six months, have you accidently leaked urine? N  Do you have problems with loss of bowel control? N  Managing your Medications? Y  Managing your Finances? Y  Housekeeping or managing your Housekeeping? Y  Some recent data might be   hidden    Fall/Depression Screening:    Fall Risk  10/22/2016  Falls in the past year? Yes  Number falls in past yr: 1  Comment fell once in 2018 going to check on his satellite  Injury with Fall? No  Risk for fall due to : Impaired balance/gait;History of fall(s);Impaired mobility;Impaired vision  Follow up Education provided   PHQ 2/9 Scores 10/22/2016 09/16/2016  PHQ - 2 Score 0 0    Assessment:     Met with Kurt Burke and his PCS (personal care service aide) in his apartmetn No falls since last visit No pain in left  Shoulder-discussed surgery risk   Appointment with Dr Hall 12/23/16 0930  Nov 29 2016 2 pm [facility] ker[facility]/20 to see dr suzanne l burk- [place] [phone] !02/12/16 130 pm [facility] for audiology dr Emma L Barnard [phone] ker/audio/ae/10 Has taken anora today after shown by aide how to acurrrately use inhaler   Plan: to follow up with Kurt Burke in 2-4 weeks   Kimberly L. Gibbs, RN, BSN, CCM [facility] Care Management [phone]       

## 2016-11-08 ENCOUNTER — Other Ambulatory Visit: Payer: Self-pay | Admitting: Pharmacist

## 2016-11-08 ENCOUNTER — Other Ambulatory Visit: Payer: Self-pay | Admitting: *Deleted

## 2016-11-08 NOTE — Patient Outreach (Signed)
[facility]) Care Management  11/08/2016  [name] 10-Jul-1929 [id number]   Care coordination   Kurt Burke Cm consulted by Kurt Burke, Personal care services Kurt Burke) aide after her arrival to Kurt Burke home on 84/13/24 to find a systolic BP of 40/10 with patient with complaints of dizziness.  Reports Kurt Burke took his HTN medication prior to her arrival Kurt Burke CM identified and educated on the hypotension episode, discussed having decrease activity until systolic BP >272 with re check in 1-2 hours. [facility] CM discussed this may be reasons for previous falls.  Discussed fall prevention.  Encouraged increase in fluids [facility] CM spoke with Kurt Burke and Kurt Burke on speaker phone to teach about hypotension and encouraged him to take BP prior to taking BP medication to prevent further future hypotension episodes.  He voiced understanding and with teach back method was able to tell Cm he would take his BP before his BP med or wait to have PCS to take it   [facility] CM consulted with pcp office on 11/06/16 to notify Kurt Burke of this hypotension episode (93/63) to get orders that was sent on 11/07/16 stating "hold for systolic less than 536 and diastolic less than 70" [facility] CM returned a call to Kurt Burke and Kurt Burke to confirm an increase in BP to 119/73. Continued to encouraged decrease in mobility, safety measures and checking BP prior to taking hypertension medications.  [facility] CM consulted with Kurt Burke pharmacist on 11/06/16 about pcp response to offer blister packaging.  Pharmacist shared blister packing will not assist with the memory issue to take inhaler, aonra,  Recommends PCS be able to remind Kurt Burke daily to take his anora.  Kurt Burke and Kurt Burke confirmed previous use of blister packaging and no preference to re start the use of blister packing again.  Shared this with pcp office. Pcp office states they were not able to find an alternative to the Jon box for medication reminder/alert.  Kurt  Burke confirms again he is not able to afford to pay $59/month for a jon medication box reminder/alert option. PCP made aware  Plans [facility] CM to follow up with Kurt Burke in 2-3 weeks for home visit    Kurt Burke. Kurt Hamman, RN, BSN, Kurt Burke Care Management [phone]

## 2016-11-08 NOTE — Patient Outreach (Signed)
[facility]) Care Management  11/08/2016  Kurt Burke Jun 03, 1929 [id number]  Successful follow-up call to patient and his aide, [name].  Patient's HIPAA details verified, [name] is on [facility] Consent form.    [name] reports Anoro dose counter reads 19 today.  If patient were taking inhaler daily, it would read 14 doses remaining.  While patient did not take Anoro daily, this is an improvement in his maintenance inhaler usage.   Discussed with patient during call use of pill packaging for his medication to help him better know if he took medications or not---he was not interested in pill packaging.  Review of 11/08/16 [facility] RN note also indicates patient was not interested in this either.   Patient denies having pharmacy needs at this time.  He was encouraged to take his medications as directed and contact [facility] Pharmacist if he has medication questions or decides to use pill packaging---although it is important to note pill packaging may not help improve his maintenance inhaler usage.    Plan:  Will close pharmacy episode at this time.   Note routed to [place].   Patient to call [facility] Pharmacist if needed in the future.    Karrie Meres, PharmD, [place] [phone]

## 2016-11-13 DIAGNOSIS — J449 Chronic obstructive pulmonary disease, unspecified: Secondary | ICD-10-CM | POA: Diagnosis not present

## 2016-11-13 DIAGNOSIS — K219 Gastro-esophageal reflux disease without esophagitis: Secondary | ICD-10-CM | POA: Diagnosis not present

## 2016-11-19 ENCOUNTER — Other Ambulatory Visit: Payer: Self-pay | Admitting: *Deleted

## 2016-11-19 NOTE — Patient Outreach (Signed)
[facility]) Care Management   11/19/2016  Kurt Burke 16-Jun-1929 [id number]  Kurt Burke is an 83 y.o. male  Subjective: see notes below  Objective:   BP 132/78   Pulse 68   Temp (!) 96.5 F (35.8 C) (Oral)   Resp 18   SpO2 95%   Review of Systems  HENT: Negative.   Eyes: Negative.   Respiratory: Negative.   Cardiovascular: Negative.   Gastrointestinal: Negative.   Genitourinary: Negative.   Musculoskeletal: Positive for joint pain.  Skin: Negative.   Neurological: Positive for weakness. Negative for dizziness.  Endo/Heme/Allergies: Negative.   Psychiatric/Behavioral: Positive for memory loss.    Physical Exam  Constitutional: He is oriented to person, place, and time. He appears well-developed and well-nourished.  HENT:  Head: Normocephalic and atraumatic.  Eyes: Conjunctivae are normal. Pupils are equal, round, and reactive to light.  Neck: Normal range of motion. Neck supple.  Cardiovascular: Normal rate and regular rhythm.  Respiratory: Effort normal and breath sounds normal.  GI: Soft. Bowel sounds are normal.  Musculoskeletal: Normal range of motion.  Neurological: He is alert and oriented to person, place, and time.  Skin: Skin is warm and dry.  Psychiatric: He has a normal mood and affect. His behavior is normal. Judgment and thought content normal.    Encounter Medications:   Outpatient Encounter Medications as of 11/19/2016  Medication Sig Note  . carbamide peroxide (DEBROX) 6.5 % otic solution Place 5 drops into both ears 2 (two) times daily as needed (ear wax removal).    . cholecalciferol (VITAMIN D) 1000 units tablet Take 1,000 Units by mouth daily.   Marland Kitchen dexlansoprazole (DEXILANT) 60 MG capsule Take 60 mg by mouth daily.   . furosemide (LASIX) 40 MG tablet Take 40 mg by mouth daily.   Marland Kitchen levalbuterol (XOPENEX) 0.63 MG/3ML nebulizer solution Take 3 mLs (0.63 mg total) by nebulization every 8 (eight) hours as needed for  wheezing or shortness of breath. 09/24/2016: Does not use as frequently as he should, not every day  . meclizine (ANTIVERT) 25 MG tablet Take 25 mg by mouth 4 (four) times daily as needed for dizziness.  10/09/2016: Patient reports not taking everyday  . metoprolol succinate (TOPROL-XL) 50 MG 24 hr tablet Take 50 mg by mouth daily.   . Omega-3 Fatty Acids (FISH OIL) 1200 MG CPDR Take 1 capsule by mouth daily.    . potassium chloride SA (K-DUR,KLOR-CON) 20 MEQ tablet Take 20 mEq by mouth daily.    Marland Kitchen umeclidinium-vilanterol (ANORO ELLIPTA) 62.5-25 MCG/INH AEPB Inhale 1 puff into the lungs daily. 10/22/2016: Reports taking anora "when he fells like it" on 10/22/16 He had not taken Anora  . ZETIA 10 MG tablet Take 10 mg by mouth daily.    No facility-administered encounter medications on file as of 11/19/2016.     Functional Status:   In your present state of health, do you have any difficulty performing the following activities: 09/24/2016  Hearing? Y  Vision? Y  Difficulty concentrating or making decisions? Y  Walking or climbing stairs? N  Dressing or bathing? N  Doing errands, shopping? Y  Preparing Food and eating ? N  Using the Toilet? N  In the past six months, have you accidently leaked urine? N  Do you have problems with loss of bowel control? N  Managing your Medications? Y  Managing your Finances? Y  Housekeeping or managing your Housekeeping? Y  Some recent data might be hidden  Fall/Depression Screening:    Fall Risk  10/22/2016  Falls in the past year? Yes  Number falls in past yr: 1  Comment fell once in 2018 going to check on his satellite  Injury with Fall? No  Risk for fall due to : Impaired balance/gait;History of fall(s);Impaired mobility;Impaired vision  Follow up Education provided   [facility] 2/9 Scores 10/22/2016 09/16/2016  PHQ - 2 Score 0 0    Assessment:    Falls none   Medications Kurt Burke is still not taken his anora on a routine basis His personal  care services [facility]) aide has recorded the number on the anora each day and made notes in his [facility] calendar since 11/06/16. He was noted to refuse anora dose after encouragement on 11/07/16, 11/08/16,  11/24/16. 11/18/16 and 11/19/16  He took his Inhaler on with a record of that being his  #15  Dose on 11/19/16  Cm observed Kurt Burke taking his Elliot Gault Noted difficulty with finger dexterity in pulling back lever and was noted blowing in versus sucking in  Now Anora states 14  CM given a pack of Antivert dated in April 2017 to dispose of since he does not take them   [facility] CM offered to call [place] [facility] to have the dose brought to him on a prn basis.  Kurt Burke or his aide will call the pharmacy if the [place] is needed  Sent chocolate ensure he does not like chocolate. 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 [name], RN, BSN, [place] Care Management [phone]

## 2016-11-23 DIAGNOSIS — J418 Mixed simple and mucopurulent chronic bronchitis: Secondary | ICD-10-CM | POA: Diagnosis not present

## 2016-11-25 IMAGING — DX DG CHEST 2V
2 series · 2 of 2 positions shown · non-contrast
Comparison: 01/22/2013

CLINICAL DATA: Productive cough. Right-sided chest pain. Short of
breath. Symptoms for 1 week.

EXAM:
CHEST  2 VIEW

[chest pa]
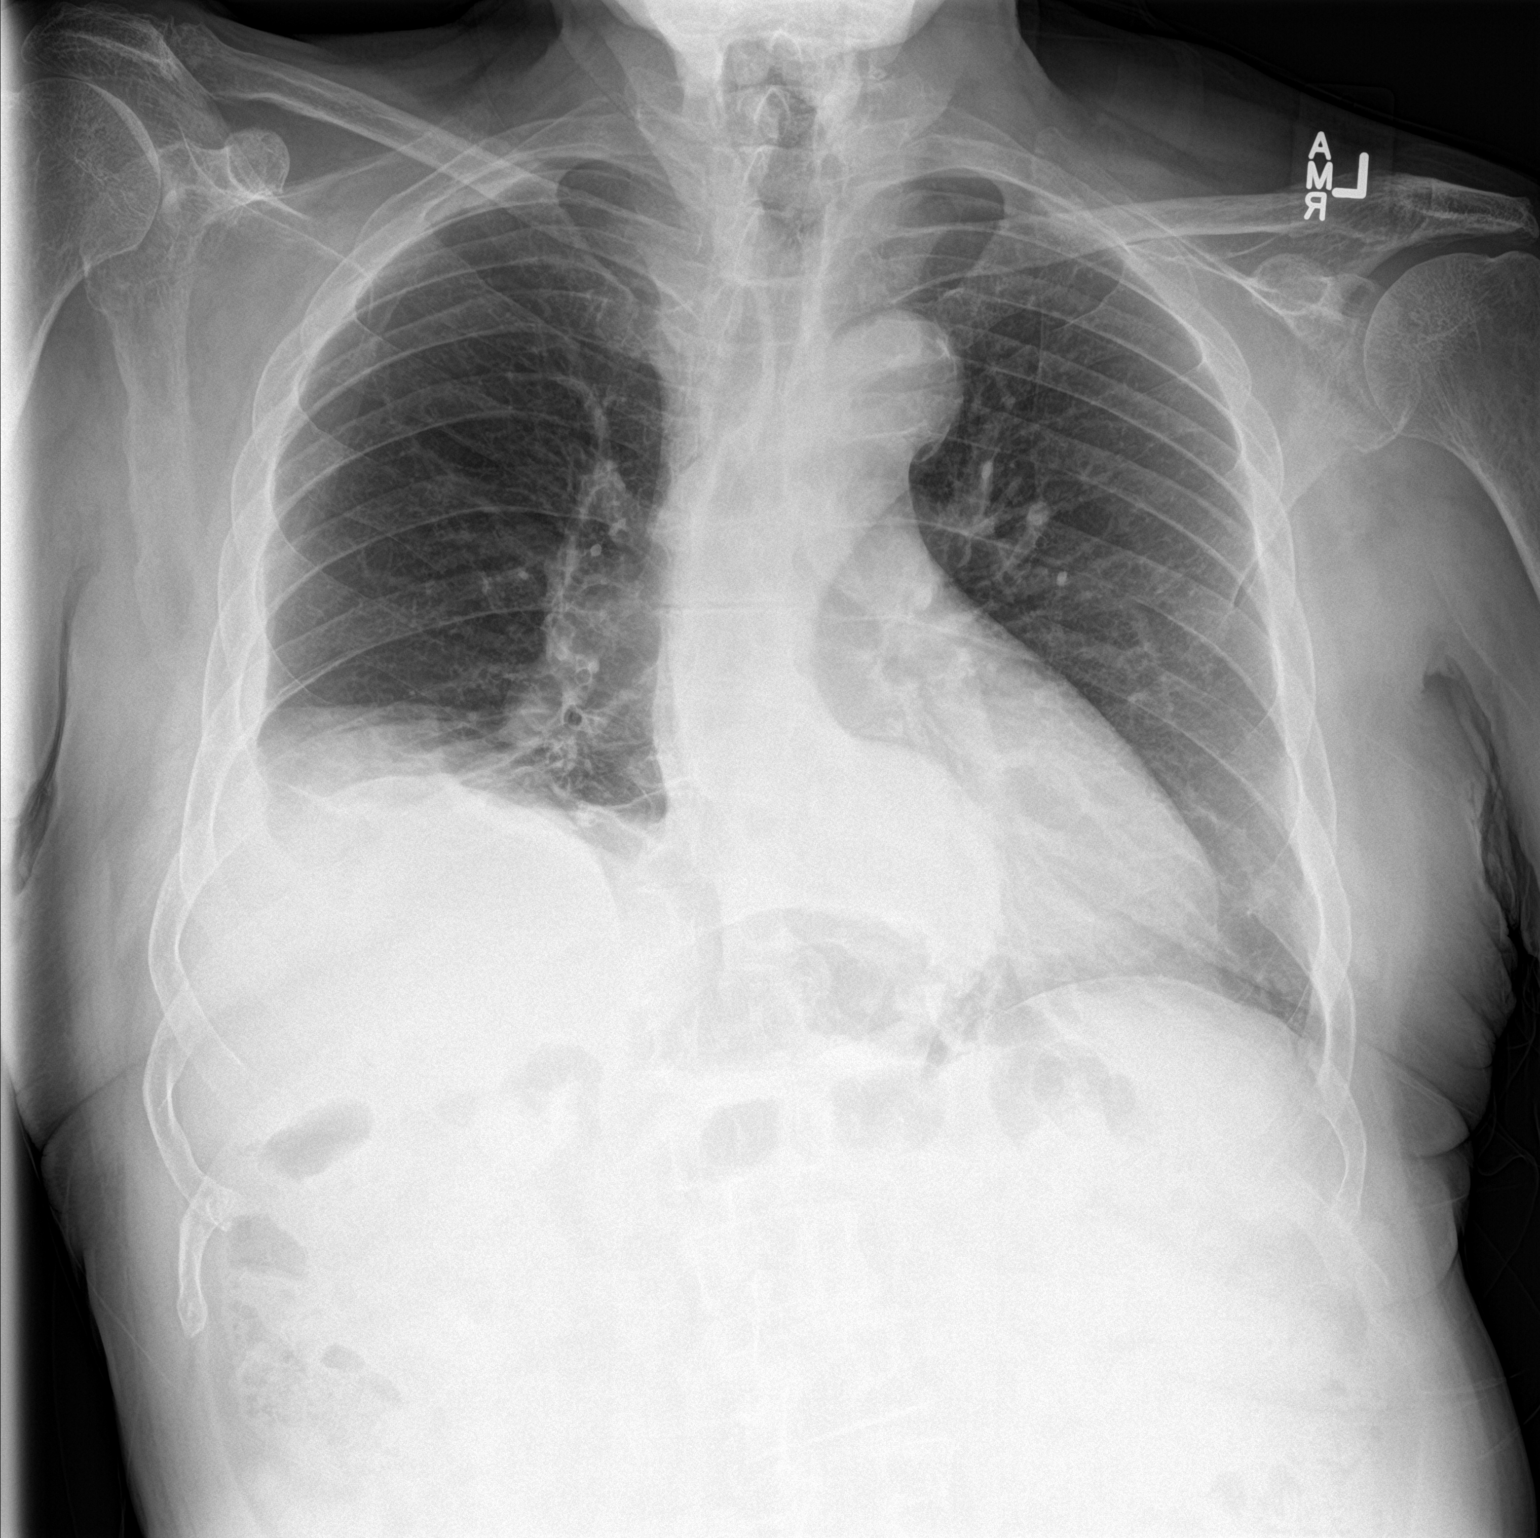

[chest lat]
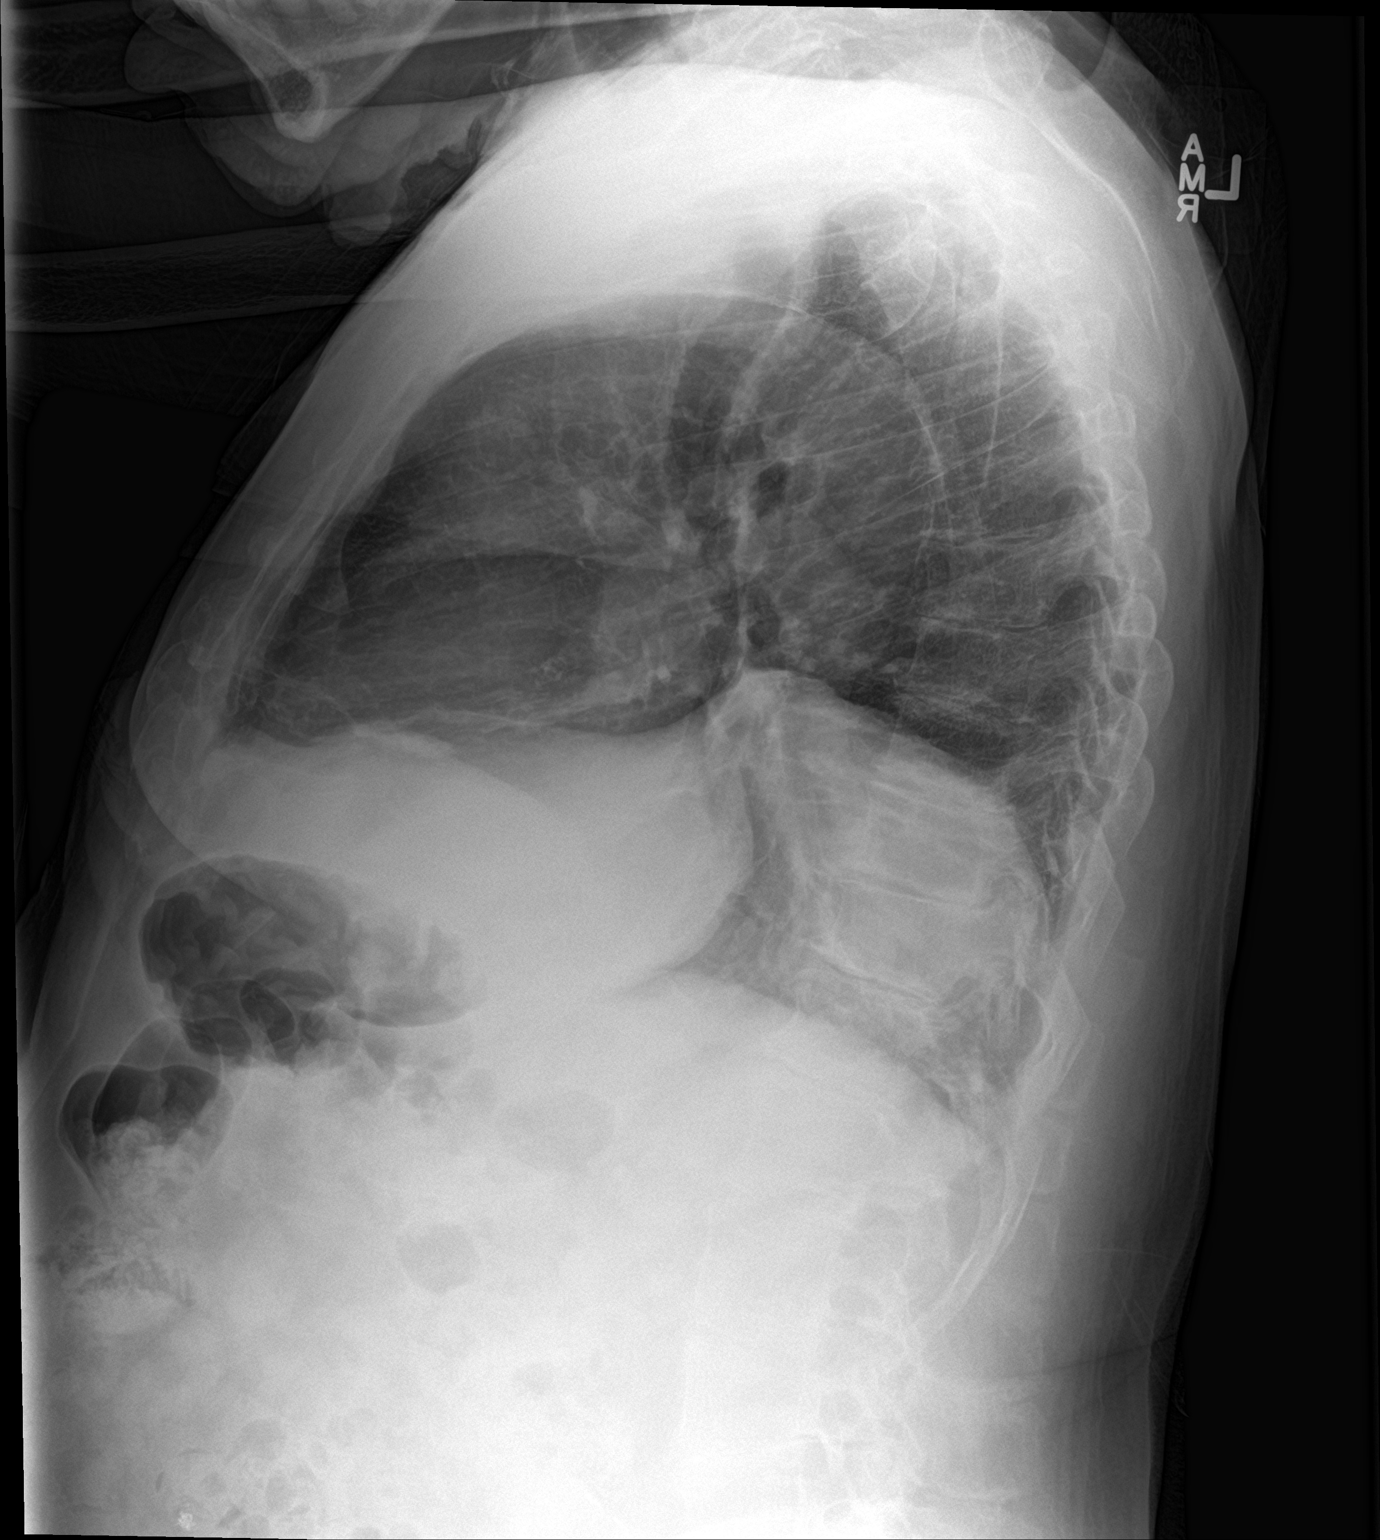

[2 of 2 positions shown; findings below may reference images not displayed]

FINDINGS: Normal heart size. Stable hiatal hernia. There is pleural thickening
along the right lateral lower lung and elevation of the right
hemidiaphragm associated with blunting of the right costophrenic
angle. On the lateral view, the posterior right costophrenic angle
is distinct. This suggests either thickened pleural soft tissue or a
loculated pleural effusion. There is associated pulmonary opacity at
the right base which may represent volume loss.
IMPRESSION: There is abnormal pleural thickening at the right lung base as
described representing either soft tissue or loculated fluid. CT can
be performed to further delineate. Chronic changes are noted.

## 2016-12-13 DIAGNOSIS — J449 Chronic obstructive pulmonary disease, unspecified: Secondary | ICD-10-CM | POA: Diagnosis not present

## 2016-12-13 DIAGNOSIS — K219 Gastro-esophageal reflux disease without esophagitis: Secondary | ICD-10-CM | POA: Diagnosis not present

## 2016-12-23 ENCOUNTER — Other Ambulatory Visit: Payer: Self-pay

## 2016-12-23 ENCOUNTER — Other Ambulatory Visit: Payer: Self-pay | Admitting: *Deleted

## 2016-12-23 DIAGNOSIS — J418 Mixed simple and mucopurulent chronic bronchitis: Secondary | ICD-10-CM | POA: Diagnosis not present

## 2016-12-23 DIAGNOSIS — I10 Essential (primary) hypertension: Secondary | ICD-10-CM | POA: Diagnosis not present

## 2016-12-23 NOTE — Patient Outreach (Signed)
[facility]) Care Management   12/23/2016  Kurt Burke 02/26/1929 [id number]  Kurt Burke is an 83 y.o. male with cognitive impairment and oxygen dependent (2 L /[place] continuously) COPD who lives in [place], [place] and was referred by his primary care provider, Kurt. Wende Neighbors, to [place] Management for evaluation of home safety/fall risk and medication management and compliance concerns. Kurt. Burke' granted verbal permission to our telephonic case management team to speak with Kurt Burke, his primary caregiver also during home visits  Subjective: see notes below  Objective:   BP (!) 142/64   Pulse 68   Wt 171 lb 9.6 oz (77.8 kg)   SpO2 98%   BMI 26.09 kg/m  Review of Systems  Constitutional: Negative for chills, diaphoresis, fever, malaise/fatigue and weight loss.  HENT: Positive for hearing loss. Negative for congestion, ear discharge, ear pain, nosebleeds, sinus pain, sore throat and tinnitus.        Has hearing aides but does not wear because prefers to have clip behind the ear aides versus the one he has now  Eyes: Negative.  Negative for blurred vision, double vision, photophobia, pain, discharge and redness.  Respiratory: Negative.  Negative for cough, hemoptysis, sputum production, shortness of breath, wheezing and stridor.   Cardiovascular: Negative.  Negative for chest pain, palpitations, orthopnea, claudication, leg swelling and PND.  Gastrointestinal: Negative.  Negative for abdominal pain, blood in stool, constipation, diarrhea, heartburn, melena, nausea and vomiting.  Genitourinary: Negative.  Negative for dysuria, flank pain, frequency, hematuria and urgency.  Musculoskeletal: Positive for joint pain. Negative for back pain, falls, myalgias and neck pain.       Left shoulder pain at intervals  Skin: Positive for rash. Negative for itching.       Left hand nail bed with splinting on middle and ring fingers  Neurological: Positive for dizziness  and weakness. Negative for tingling, tremors, sensory change, speech change, focal weakness, seizures, loss of consciousness and headaches.       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  Negative for polydipsia. Does not bruise/bleed easily.  Psychiatric/Behavioral: Positive for memory loss. Negative for depression, hallucinations, substance abuse and suicidal ideas. The patient is not nervous/anxious and does not have insomnia.     Physical Exam  Constitutional: He is oriented to person, place, and time. He appears well-developed and well-nourished.  HENT:  Head: Normocephalic and atraumatic.  Eyes: Conjunctivae are normal. Pupils are equal, round, and reactive to light.  Neck: Normal range of motion. Neck supple.  Cardiovascular: Normal rate and regular rhythm.  Respiratory: Effort normal and breath sounds normal.  GI: Soft. Bowel sounds are normal.  Musculoskeletal: Normal range of motion.  Neurological: He is alert and oriented to person, place, and time.  Psychiatric: He has a normal mood and affect. His behavior is normal. Judgment and thought content normal.    Encounter Medications:   Outpatient Encounter Medications as of 12/23/2016  Medication Sig Note  . carbamide peroxide (DEBROX) 6.5 % otic solution Place 5 drops into both ears 2 (two) times daily as needed (ear wax removal).    . cholecalciferol (VITAMIN D) 1000 units tablet Take 1,000 Units by mouth daily.   Marland Kitchen dexlansoprazole (DEXILANT) 60 MG capsule Take 60 mg by mouth daily.   . furosemide (LASIX) 40 MG tablet Take 40 mg by mouth daily.   Marland Kitchen levalbuterol (XOPENEX) 0.63 MG/3ML nebulizer  solution Take 3 mLs (0.63 mg total) by nebulization every 8 (eight) hours as needed for wheezing or shortness of breath. 09/24/2016: Does not use as frequently as he should, not every day  . meclizine (ANTIVERT) 25 MG tablet Take 25 mg by mouth 4 (four) times daily as needed for  dizziness.  10/09/2016: Patient reports not taking everyday  . metoprolol succinate (TOPROL-XL) 50 MG 24 hr tablet Take 50 mg by mouth daily.   . Omega-3 Fatty Acids (FISH OIL) 1200 MG CPDR Take 1 capsule by mouth daily.    . potassium chloride SA (K-DUR,KLOR-CON) 20 MEQ tablet Take 20 mEq by mouth daily.    Marland Kitchen umeclidinium-vilanterol (ANORO ELLIPTA) 62.5-25 MCG/INH AEPB Inhale 1 puff into the lungs daily. 10/22/2016: Reports taking anora "when he fells like it" on 10/22/16 He had not taken Anora  . ZETIA 10 MG tablet Take 10 mg by mouth daily.    No facility-administered encounter medications on file as of 12/23/2016.     Functional Status:   In your present state of health, do you have any difficulty performing the following activities: 09/24/2016  Hearing? Y  Vision? Y  Difficulty concentrating or making decisions? Y  Walking or climbing stairs? N  Dressing or bathing? N  Doing errands, shopping? Y  Preparing Food and eating ? N  Using the Toilet? N  In the past six months, have you accidently leaked urine? N  Do you have problems with loss of bowel control? N  Managing your Medications? Y  Managing your Finances? Y  Housekeeping or managing your Housekeeping? Y  Some recent data might be hidden    Fall/Depression Screening:    Fall Risk  11/19/2016 10/22/2016  Falls in the past year? No Yes  Number falls in past yr: 1 1  Comment - fell once in 2018 going to check on his satellite  Injury with Fall? No No  Risk for fall due to : Impaired balance/gait;Medication side effect;Impaired mobility;Impaired vision;History of fall(s) Impaired balance/gait;History of fall(s);Impaired mobility;Impaired vision  Follow up Education provided Education provided   [facility] 2/9 Scores 10/22/2016 09/16/2016  PHQ - 2 Score 0 0    Assessment:    Met with Kurt Burke and his Personal care aide (PCS). Kurt Burke at [employer], Kurt Burke.   Kurt Burke was initially quiet during wait to see the pcp but he became  more responsive and actively talking and joking to Cm and pcp staff. He is using his cane for ambulation "I don't wear my hearing aides because I can't keep them in.  I want the ones the clip behind your ear" He reports he still gets meals on wheels   Medications Kurt Loera admits to not using his Anora inhaler (ordered 1 puff qd) "that much"  PCS aide confirms now the number on the inhaler is at 8 In November 2018 home visit was noted to be down to 14 Therefore Cm spoke with pcp Rn and pcp about this usage of Anora, past motor skills with taking it and wanting to continue attempts to increase compliance with Anora.  Discussed previous Economy interventions "too inconvenient"   Complaint today - Nail beds splinting on left hand More Discussed issues with Iron levels & possible fungus Discussed with Primary care provider who does not think it is fungal related but cosmetic and recommends a MVI with Iron.Primary provider to re check issue at next office visit Primary care provider dicussed with Kurt Cheramie that his anora need  to be used more and there should not  be not dexterity issues  Primary care provider spoke with pt about the Anora use to prevent bronchitis, pneumonia etc  When asked about how he feels about it Kurt Dettore refers to Kurt Tamala Julian (a previous MD seen) telling him he was "born with it"  Kurt Burke discussed not giving up on maintaining his health  Falls None since September 2018  Discussed with Kurt Burke that Kurt Vanwagoner has completed a living will, [place] connection, has an upcoming [place] E[place] appointment but missed a recent [place] pcp appointment   Primary care provider follow up visits Labs recheck in 3 months Next march 18, 21 at [place] am   Left office visit to get home in time for his meals on wheels delivery  Plan:  Discussed pcp visit outcome with PCS- MVI with iron, nails , keep trying anora watch for falls CM will follow up with Kurt Mcferran in 3-4 weeks and will route note to other  care team members    Brayton L. [name], RN, BSN, [place] Care Management [phone]

## 2016-12-25 ENCOUNTER — Other Ambulatory Visit: Payer: Self-pay | Admitting: *Deleted

## 2016-12-25 NOTE — Patient Outreach (Signed)
[facility]) Care Management  12/25/2016  MACALLAN ORD 06/03/1929 [id number]   Care coordination    [facility] CM received a return cal from [place] care services aide for Mr Jerelene Redden today after CM had called Mr Jerelene Redden on 12/24/16 to review with him that contact had been made to his pcp office to review the concern voiced by [name] on 12/24/16 related continued delivery of Spiriva to Mr Winrow home when he is no longer being ordered per [name] take Spiriva. [name] reports when she arrived Mr Waibel remembered [facility] CM had called but could not recall the details Cm discussed with him on 12/24/16.  CM clarified with [name] that pcp office had been contacted, Mr Skidmore is not to take Spiriva, he is to take Anora and the pcp office staff will be contacting [place] to confirm the monthly discontinuation delivery of Spiriva to Mr Kentner and he is only to take the [place].  [name] confirms she reminds Mr Corpus daily on the need to use Anora and she keeps up with the number of use on the Anora container  CM made contact with staff at pcp, [name] office about Mr Goins receiving Spiriva in home delivery.  [name] and CM had previously contacted [facility] to discuss this and was informed contact would be made to pcp office.  CM discussed with pcp office staff the need to clarify if Spiriva needs to be restarted.  During the 12/23/16 pcp office visit pcp confirmed pt to continue only with Anora.  The office staff consulted [name] and responded to CM that pt is to stop spiriva and take only Anora, CM requested pcp office contact [place] [facility] to confirm Spiriva is to be taken off the medication profile.  PCP staff responded that [employer] would be contacted to confirm Spiriva is not on medication profile  Plans: [facility] CM to follow up with Mr Thornsberry in 3-4 weeks  [facility] CM called [employer] today, spoke with to evaluation if pcp  staff made contact with [employer] to request removal of Spiriva from medication profile and discontinuation of monthly delivery of Spiriva to Mr Romano Stigger, pharmacist at [place] is not on the renewed medication Rx from pcp office on 12/23/16,  Medications were delivered to Mr Dupin on 12/23/16 and the pcp office needs to call to updated [place] [facility] so the sealed Spiriva delivered on 12/23/16 can be returned and credited to Mr Zane  Route this note to pcp (No pulmonologist)  Mickel Crow. [name], RN, BSN, [place] Care Management [phone]

## 2017-01-13 DIAGNOSIS — J449 Chronic obstructive pulmonary disease, unspecified: Secondary | ICD-10-CM | POA: Diagnosis not present

## 2017-01-13 DIAGNOSIS — K219 Gastro-esophageal reflux disease without esophagitis: Secondary | ICD-10-CM | POA: Diagnosis not present

## 2017-01-17 ENCOUNTER — Other Ambulatory Visit: Payer: Self-pay | Admitting: *Deleted

## 2017-01-17 NOTE — Patient Outreach (Signed)
[facility] [facility]) Care Management   01/17/2017  Kurt Burke [birth year] [id number]  Kurt Burke is an 84 y.o. male  Subjective: see notes below  Objective:   BP 120/78   Pulse 71   Temp (!) 96 F (35.6 C) (Oral)   Resp 18   SpO2 98%   Review of Systems  Constitutional: Negative for chills, diaphoresis, fever, malaise/fatigue and weight loss.  HENT: Negative for congestion, hearing loss, nosebleeds, sinus pain and sore throat.   Eyes: Negative.  Negative for blurred vision, double vision, photophobia, pain, discharge and redness.  Respiratory: Negative.  Negative for cough, hemoptysis, sputum production, shortness of breath and wheezing.   Cardiovascular: Negative.  Negative for chest pain, palpitations, orthopnea, claudication, leg swelling and PND.  Gastrointestinal: Positive for heartburn. Negative for abdominal pain, blood in stool, constipation, diarrhea, melena, nausea and vomiting.  Genitourinary: Negative.  Negative for dysuria, flank pain, frequency, hematuria and urgency.  Musculoskeletal: Negative.  Negative for back pain, falls, joint pain, myalgias and neck pain.  Neurological: Positive for headaches. Negative for tingling, tremors, sensory change, speech change, focal weakness, seizures, loss of consciousness and weakness.  Endo/Heme/Allergies: Negative for environmental allergies and polydipsia. Does not bruise/bleed easily.  Psychiatric/Behavioral: Positive for memory loss. Negative for depression, hallucinations, substance abuse and suicidal ideas. The patient is not nervous/anxious and does not have insomnia.     Physical Exam  Constitutional: He is oriented to person, place, and time. He appears well-developed and well-nourished.  HENT:  Head: Normocephalic and atraumatic.  Eyes: Conjunctivae are normal. Pupils are equal, round, and reactive to light.  Neck: Normal range of motion. Neck supple.  Cardiovascular: Normal rate and regular  rhythm.  Respiratory: Effort normal and breath sounds normal.  GI: Soft. Bowel sounds are normal.  Musculoskeletal: Normal range of motion.  Neurological: He is alert and oriented to person, place, and time.  Skin: Skin is warm and dry.  Psychiatric: He has a normal mood and affect. His behavior is normal. Judgment and thought content normal.    Encounter Medications:   Outpatient Encounter Medications as of 01/17/2017  Medication Sig Note  . carbamide peroxide (DEBROX) 6.5 % otic solution Place 5 drops into both ears 2 (two) times daily as needed (ear wax removal).    . cholecalciferol (VITAMIN D) 1000 units tablet Take 1,000 Units by mouth daily.   Marland Kitchen dexlansoprazole (DEXILANT) 60 MG capsule Take 60 mg by mouth daily.   . furosemide (LASIX) 40 MG tablet Take 40 mg by mouth daily.   Marland Kitchen levalbuterol (XOPENEX) 0.63 MG/3ML nebulizer solution Take 3 mLs (0.63 mg total) by nebulization every 8 (eight) hours as needed for wheezing or shortness of breath. 09/24/2016: Does not use as frequently as he should, not every day  . meclizine (ANTIVERT) 25 MG tablet Take 25 mg by mouth 4 (four) times daily as needed for dizziness.  10/09/2016: Patient reports not taking everyday  . metoprolol succinate (TOPROL-XL) 50 MG 24 hr tablet Take 50 mg by mouth daily.   . Omega-3 Fatty Acids (FISH OIL) 1200 MG CPDR Take 1 capsule by mouth daily.    . potassium chloride SA (K-DUR,KLOR-CON) 20 MEQ tablet Take 20 mEq by mouth daily.    Marland Kitchen umeclidinium-vilanterol (ANORO ELLIPTA) 62.5-25 MCG/INH AEPB Inhale 1 puff into the lungs daily. 10/22/2016: Reports taking anora "when he fells like it" on 10/22/16 He had not taken Anora  . ZETIA 10 MG tablet Take 10 mg by mouth daily.  No facility-administered encounter medications on file as of 01/17/2017.     Functional Status:   In your present state of health, do you have any difficulty performing the following activities: 09/24/2016  Hearing? Y  Vision? Y  Difficulty  concentrating or making decisions? Y  Walking or climbing stairs? N  Dressing or bathing? N  Doing errands, shopping? Y  Preparing Food and eating ? N  Using the Toilet? N  In the past six months, have you accidently leaked urine? N  Do you have problems with loss of bowel control? N  Managing your Medications? Y  Managing your Finances? Y  Housekeeping or managing your Housekeeping? Y  Some recent data might be hidden    Fall/Depression Screening:    Fall Risk  11/19/2016 10/22/2016  Falls in the past year? No Yes  Number falls in past yr: 1 1  Comment - fell once in 2018 going to check on his satellite  Injury with Fall? No No  Risk for fall due to : Impaired balance/gait;Medication side effect;Impaired mobility;Impaired vision;History of fall(s) Impaired balance/gait;History of fall(s);Impaired mobility;Impaired vision  Follow up Education provided Education provided   [facility] 2/9 Scores 10/22/2016 09/16/2016  PHQ - 2 Score 0 0    Assessment:   CM met Kurt Burke and his personal care aide sylvia at his apartment. Kurt Burke was sitting in his recliner when  This CM arrived watching tc.  He informed CM he felt fine. He was up dancing and joking   Cm had been called by his aide with concern for an acute illness related to a head ache  Cm assessed him for the headache reported by [name]  He reports his headache pain is located on the top of his head only Denies pain near any sinuses, no fever  GI concerns increases spitting during home visit  Kurt Burke request to know Dr  Juel Burrow recommendation He confirms with CM that he is still taking Dexilant daily, sob, cough, runny nose, wheezing, sneezing, sore throat, ear pain or eye pain VSS. He joked that it goes away with a little drink  Plan:  Left a message for GI DR Laural Golden but the office was closed  Cm made contact with [name] office to report Kurt Burke experience with interval headaches and increase spitting of sputum related to  gastric reflux during this home visit  Cm requested a return call to [place]  At 1558 staff at [name] office responded that a message was sent to [name] to see what he recommends and [name] would be contacted CM updated sylvia at 1616   Follow up with Kurt Burke in 2-3 weeks    Joelene Millin L. [name], RN, BSN, [place] Care Management [phone]

## 2017-01-20 ENCOUNTER — Telehealth: Payer: Self-pay | Admitting: *Deleted

## 2017-01-20 NOTE — Patient Outreach (Signed)
[place] [facility]) Care Management  01/20/2017  Kurt Burke May 21, 1929 [id number]   Care coordination  CM received a message from Kurt Ricke' aide, Kurt Burke,  to update CM that Kurt Burke called this morning (01/20/17) to make additional GERD Rx recommendations, sending Rx to pharmacy and requesting an appointment with Kurt Burke for further evaluation.    CM received a call from Kurt Burke staff to schedule an appointment Confirms the last Appointment noted for GI provider was in April 2018. The RN was referred to Kurt Burke to assist with scheduling a time to have Kurt Burke seen at Kurt Burke office.  Cm called Kurt Burke and spoke with him and Kurt Burke about the call from Kurt Burke staff to schedule an appointment for further evaluation.   Plans: Cm encouraged Kurt Burke and his adie to contact Cm prn   Kurt Millin L. [name], RN, BSN, [place] Care Management [phone]

## 2017-01-20 NOTE — Patient Outreach (Signed)
[place] [facility]) Care Management  01/20/2017  GRASON BRAILSFORD 1929/07/08 [id number]   Care coordination   CM received a return call from Dr Laural Golden to review Mr. Spratley symptoms Cm reviewed calls from [name] office and Dr Olevia Perches office today    Plans Mr Massing to have an appointment on 01/23/17 1015 to see Park Breed, NP at Dr Olevia Perches office   Joelene Millin L. [name], RN, BSN, [place] Care Management [phone]

## 2017-01-23 ENCOUNTER — Ambulatory Visit (INDEPENDENT_AMBULATORY_CARE_PROVIDER_SITE_OTHER): Payer: Medicare Other | Admitting: Internal Medicine

## 2017-01-23 DIAGNOSIS — J418 Mixed simple and mucopurulent chronic bronchitis: Secondary | ICD-10-CM | POA: Diagnosis not present

## 2017-01-30 ENCOUNTER — Other Ambulatory Visit: Payer: Self-pay | Admitting: *Deleted

## 2017-01-30 NOTE — Patient Outreach (Signed)
Kurt Burke) Care Management  01/30/2017  BASEL DEFALCO Mar 22, 1929 [id number]   Care coordination   Minna Merritts, Kurt Redfield personal care aide contacted CM to update CM that Kurt Burke was drinking  More water and using his anora more.   CM commended him for this improvement Cm inquired about his 01/23/17 GI appointment that was scheduled but was informed the appointment was cancelled and rescheduled because Kurt Burke preference is to see Dr Laural Golden.  CM notes that there is a rescheduled GI appointment but it is with NP Setzer per Epic for 05/31/17 at 12 CM also informed Kurt [facility] because his niece does not prefer that Kurt Burke go to the [place] in [place] and there was not transportation available at the time of the appointment that was scheduled CM encouraged Kurt Burke to have the niece to call CM if there were questions about medical providers and Kurt Burke would not have to stop seeing [name] (which was a voiced concern)  SPERO GUNNELS is an 84 y.o. male with cognitive impairment and oxygen dependent (2l/[place] continuously) COPD who lives in [place], [place] and was referred on 09/16/16 by his primary care provider, Dr. Wende Neighbors, to [place] [facility] for evaluation of home safety/fall risk and medication management and compliance concerns. Kurt. Lesser' granted verbal permission to our telephonic case management team to speak with [name], his primary caregiver   Plan case closure discussed during the December 2018 office visit with [name] and during the home visit a few weeks ago  Kurt Grigg continues to progress toward his goals at his pace but has not had any further falls, takes his anora how he prefers and has his personal care aide daily to assist in reminding him to take his medications. He prefers not to pay for blister packs nor a voice activated medication reminder system recommended  previous by MD related to cost. He has been seen by [place] and pharmacy case closed [facility] CMA updated Letters sent to Kurt Burke and [name] Case closure reason- Consumer is not able to progress through the [place] Problem One     Most Recent Value  Care Plan Problem One  Home Safety/Fall Risk  Role Documenting the Problem One  Care Management Coordinator  Care Plan for Problem One  Active  THN Long Term Goal   Over the next 31 days, patient and/or caregiver will verbalize understanding of plan of care for fall risk reduction  THN Long Term Goal Start Date  09/16/16  [facility] Long Term Goal Met Date  12/23/16  Interventions for Problem One Long Term Goal  Emmi educational materials prescribed,  telephone follow up scheduled  THN CM Short Term Goal #1   Over the next 30 days, patient and/or caregiver will work with primary care manager to develop interventions for fall risk reduction  THN CM Short Term Goal #1 Start Date  09/16/16  [facility] CM Short Term Goal #1 Met Date  11/05/16  Interventions for Short Term Goal #1  Emmi educational tools prescribed    [facility] CM Care Plan Problem Two     Most Recent Value  Care Plan Problem Two  Knowledge Deficits related to chronic health condition (COPD)  Role Documenting the Problem Two  Care Management [place] for Problem Two  Active  Interventions for Problem Two Long Term Goal  Emmi educational tools prescribed  THN Long Term Goal  Over the next 60 days, patient and/or caregiver will verbalize understanding of plan of care for long term self health management of COPD  THN Long Term Goal Start Date  09/16/16  THN Long Term Goal Met Date  11/19/16  THN CM Short Term Goal #1   Over the next 30 days, patient and/or caregiver will verbalize basic understanding of COPD diagnosis  THN CM Short Term Goal #1 Start Date  09/16/16  [facility] CM Short Term Goal #1 Met Date   10/22/16  Interventions for Short Term Goal #2   Emmi  educational tools prescribed  THN CM Short Term Goal #2   Over the next 30 days, patient and/or caregiver will verbalize and demonstrate understanding of which prescribed medications are used for treatment of COPD  THN CM Short Term Goal #2 Start Date  09/16/16  [facility] CM Short Term Goal #2 Met Date  10/22/16  Interventions for Short Term Goal #2  ensured pharmacy referral  THN CM Short Term Goal #3   Over the next 30 days, patient and/or caregiver will verbalize understanding of signs and symptoms of COPD  THN CM Short Term Goal #3 Start Date  09/16/16  [facility] CM Short Term Goal #3 Met Date  10/22/16  Interventions for Short Term Goal #3  Emmi educational tools prescribed    [facility] CM Care Plan Problem Three     Most Recent Value  Care Plan Problem Three  Medication Management Concerns  Role Documenting the Problem Three  Care Management Coordinator  Care Plan for Problem Three  Active  THN CM Short Term Goal #1   Over the next 14 days,  patient and/or caregiver will work with [place] pharmacist to address medication management and adherence concerns  THN CM Short Term Goal #1 Start Date  09/16/16  [facility] CM Short Term Goal #1 Met Date  09/30/16  Interventions for Short Term Goal #1  ensured pharmacy referral       [place]. [name], RN, BSN, [place] Care Management [phone]

## 2017-02-13 DIAGNOSIS — J449 Chronic obstructive pulmonary disease, unspecified: Secondary | ICD-10-CM | POA: Diagnosis not present

## 2017-02-13 DIAGNOSIS — K219 Gastro-esophageal reflux disease without esophagitis: Secondary | ICD-10-CM | POA: Diagnosis not present

## 2017-02-23 DIAGNOSIS — J418 Mixed simple and mucopurulent chronic bronchitis: Secondary | ICD-10-CM | POA: Diagnosis not present

## 2017-03-04 ENCOUNTER — Other Ambulatory Visit: Payer: Self-pay | Admitting: *Deleted

## 2017-03-04 NOTE — Patient Outreach (Signed)
[facility]) Care Management  03/04/2017  Kurt Burke 07-06-1929 [id number]   Care coordination   CM received contact from [name] and his Personal care services (PCS) aide, sylvia requesting a return call CM returned the call and was informed by sylvia that [name] had started "not taking his medicines again. You don't want me to have to call [name]" and inquired about a [facility] referral CM discussed that [name] choosing not to take his medication selectively should be discussed with his Primary care provider, [name] and that poor choices is something that would need to be discussed.  Reminded them that CM and pharmacist worked with [name] without him complying to take his medicine as ordered. CM counseled and recommended to [name] that he take his medications as ordered by his MD. [name] and [name] states they wanted to see if "that would be able to get you to see him again." CM confirmed an appropriate qualifying referral to [facility] would be needed for any community cm home visits  [place]. [name], RN, BSN, [place] Coordinator [phone] week day mobile

## 2017-03-13 DIAGNOSIS — J449 Chronic obstructive pulmonary disease, unspecified: Secondary | ICD-10-CM | POA: Diagnosis not present

## 2017-03-13 DIAGNOSIS — K219 Gastro-esophageal reflux disease without esophagitis: Secondary | ICD-10-CM | POA: Diagnosis not present

## 2017-03-23 DIAGNOSIS — J418 Mixed simple and mucopurulent chronic bronchitis: Secondary | ICD-10-CM | POA: Diagnosis not present

## 2017-03-31 DIAGNOSIS — N183 Chronic kidney disease, stage 3 (moderate): Secondary | ICD-10-CM | POA: Diagnosis not present

## 2017-03-31 DIAGNOSIS — N184 Chronic kidney disease, stage 4 (severe): Secondary | ICD-10-CM | POA: Diagnosis not present

## 2017-03-31 DIAGNOSIS — E782 Mixed hyperlipidemia: Secondary | ICD-10-CM | POA: Diagnosis not present

## 2017-03-31 DIAGNOSIS — I10 Essential (primary) hypertension: Secondary | ICD-10-CM | POA: Diagnosis not present

## 2017-04-02 DIAGNOSIS — R600 Localized edema: Secondary | ICD-10-CM | POA: Diagnosis not present

## 2017-04-02 DIAGNOSIS — D509 Iron deficiency anemia, unspecified: Secondary | ICD-10-CM | POA: Diagnosis not present

## 2017-04-02 DIAGNOSIS — E782 Mixed hyperlipidemia: Secondary | ICD-10-CM | POA: Diagnosis not present

## 2017-04-02 DIAGNOSIS — I10 Essential (primary) hypertension: Secondary | ICD-10-CM | POA: Diagnosis not present

## 2017-04-02 DIAGNOSIS — N183 Chronic kidney disease, stage 3 (moderate): Secondary | ICD-10-CM | POA: Diagnosis not present

## 2017-04-13 DIAGNOSIS — J449 Chronic obstructive pulmonary disease, unspecified: Secondary | ICD-10-CM | POA: Diagnosis not present

## 2017-04-13 DIAGNOSIS — K219 Gastro-esophageal reflux disease without esophagitis: Secondary | ICD-10-CM | POA: Diagnosis not present

## 2017-04-16 DIAGNOSIS — Z8679 Personal history of other diseases of the circulatory system: Secondary | ICD-10-CM | POA: Diagnosis not present

## 2017-04-16 DIAGNOSIS — K219 Gastro-esophageal reflux disease without esophagitis: Secondary | ICD-10-CM | POA: Diagnosis not present

## 2017-04-16 DIAGNOSIS — G3184 Mild cognitive impairment, so stated: Secondary | ICD-10-CM | POA: Diagnosis not present

## 2017-04-16 DIAGNOSIS — E559 Vitamin D deficiency, unspecified: Secondary | ICD-10-CM | POA: Diagnosis not present

## 2017-04-16 DIAGNOSIS — R269 Unspecified abnormalities of gait and mobility: Secondary | ICD-10-CM | POA: Diagnosis not present

## 2017-04-16 DIAGNOSIS — N183 Chronic kidney disease, stage 3 (moderate): Secondary | ICD-10-CM | POA: Diagnosis not present

## 2017-04-16 DIAGNOSIS — Z87448 Personal history of other diseases of urinary system: Secondary | ICD-10-CM | POA: Diagnosis not present

## 2017-04-16 DIAGNOSIS — M79605 Pain in left leg: Secondary | ICD-10-CM | POA: Diagnosis not present

## 2017-04-23 DIAGNOSIS — J418 Mixed simple and mucopurulent chronic bronchitis: Secondary | ICD-10-CM | POA: Diagnosis not present

## 2017-05-09 ENCOUNTER — Other Ambulatory Visit: Payer: Self-pay | Admitting: *Deleted

## 2017-05-09 NOTE — Patient Outreach (Signed)
[facility]) Care Management  05/09/2017  SYLAS TWOMBLY [birth year] [id number]   Care coordination-    [facility] CM received a call from Mr Weatherford and his Personal care services (PCS) aide, sylvia Mr Nabor with pain in his "calf and foot" [name] office was contacted by [name] and s/s reviewed An appointment has been obtained for next week with a NP/PA Mr Weldy had been seen about "three weeks ago and examined but di not have this pain" Denies swelling, warmth of foot, calf When instructed on flexing the foot, denies pain of calf, only foot Denies a hx of gout, DM neuropathy Cm instructed aide on how to contact MD office to discuss further s/s and to inquire about a possible appointment wait list or an appointment if someone calls to cancel Tylenol has been provided CM suggest heat/ice, rest, elevation CM discussed urgent care center  Plans CM left a message at [name] office to inform of call from aid with increased s/s Cm received a message that Mr Sherrow was welcomed to stop in the urgent this weekend or next week  Cm left aid the details of message received from [name] office Cm reminded aid and Mr Decou of the 24 hour RN [place]. [name], RN, BSN, [place] Coordinator [phone] week day mobile

## 2017-05-10 ENCOUNTER — Ambulatory Visit (HOSPITAL_COMMUNITY)
Admission: RE | Admit: 2017-05-10 | Discharge: 2017-05-10 | Disposition: A | Discharge status: Home / Self Care | Payer: Medicare Other | Source: Ambulatory Visit | Attending: Physician Assistant | Admitting: Physician Assistant

## 2017-05-10 ENCOUNTER — Other Ambulatory Visit (HOSPITAL_COMMUNITY): Payer: Self-pay | Admitting: Physician Assistant

## 2017-05-10 DIAGNOSIS — R52 Pain, unspecified: Secondary | ICD-10-CM

## 2017-05-10 DIAGNOSIS — M79672 Pain in left foot: Secondary | ICD-10-CM | POA: Insufficient documentation

## 2017-05-10 DIAGNOSIS — I70209 Unspecified atherosclerosis of native arteries of extremities, unspecified extremity: Secondary | ICD-10-CM | POA: Insufficient documentation

## 2017-05-10 DIAGNOSIS — M85872 Other specified disorders of bone density and structure, left ankle and foot: Secondary | ICD-10-CM | POA: Diagnosis not present

## 2017-05-13 DIAGNOSIS — K219 Gastro-esophageal reflux disease without esophagitis: Secondary | ICD-10-CM | POA: Diagnosis not present

## 2017-05-13 DIAGNOSIS — J449 Chronic obstructive pulmonary disease, unspecified: Secondary | ICD-10-CM | POA: Diagnosis not present

## 2017-05-16 DIAGNOSIS — G3184 Mild cognitive impairment, so stated: Secondary | ICD-10-CM | POA: Diagnosis not present

## 2017-05-16 DIAGNOSIS — M8580 Other specified disorders of bone density and structure, unspecified site: Secondary | ICD-10-CM | POA: Diagnosis not present

## 2017-05-16 DIAGNOSIS — M79672 Pain in left foot: Secondary | ICD-10-CM | POA: Diagnosis not present

## 2017-05-19 ENCOUNTER — Other Ambulatory Visit (HOSPITAL_COMMUNITY): Payer: Self-pay | Admitting: Internal Medicine

## 2017-05-19 DIAGNOSIS — I70313 Atherosclerosis of unspecified type of bypass graft(s) of the extremities with intermittent claudication, bilateral legs: Secondary | ICD-10-CM

## 2017-05-21 ENCOUNTER — Ambulatory Visit (HOSPITAL_COMMUNITY): Payer: Medicare Other

## 2017-05-23 DIAGNOSIS — J418 Mixed simple and mucopurulent chronic bronchitis: Secondary | ICD-10-CM | POA: Diagnosis not present

## 2017-05-27 ENCOUNTER — Ambulatory Visit (HOSPITAL_COMMUNITY)
Admission: RE | Admit: 2017-05-27 | Discharge: 2017-05-27 | Disposition: A | Discharge status: Home / Self Care | Payer: Medicare Other | Source: Ambulatory Visit | Attending: Internal Medicine | Admitting: Internal Medicine

## 2017-05-27 DIAGNOSIS — I70203 Unspecified atherosclerosis of native arteries of extremities, bilateral legs: Secondary | ICD-10-CM | POA: Diagnosis not present

## 2017-05-27 DIAGNOSIS — M79672 Pain in left foot: Secondary | ICD-10-CM | POA: Insufficient documentation

## 2017-05-27 DIAGNOSIS — I70313 Atherosclerosis of unspecified type of bypass graft(s) of the extremities with intermittent claudication, bilateral legs: Secondary | ICD-10-CM

## 2017-05-27 DIAGNOSIS — I739 Peripheral vascular disease, unspecified: Secondary | ICD-10-CM | POA: Diagnosis not present

## 2017-06-06 ENCOUNTER — Encounter: Payer: Medicare Other | Admitting: Vascular Surgery

## 2017-06-13 DIAGNOSIS — J449 Chronic obstructive pulmonary disease, unspecified: Secondary | ICD-10-CM | POA: Diagnosis not present

## 2017-06-13 DIAGNOSIS — K219 Gastro-esophageal reflux disease without esophagitis: Secondary | ICD-10-CM | POA: Diagnosis not present

## 2017-06-23 DIAGNOSIS — J418 Mixed simple and mucopurulent chronic bronchitis: Secondary | ICD-10-CM | POA: Diagnosis not present

## 2017-07-01 ENCOUNTER — Ambulatory Visit (INDEPENDENT_AMBULATORY_CARE_PROVIDER_SITE_OTHER): Payer: Medicare Other | Admitting: Internal Medicine

## 2017-07-08 ENCOUNTER — Ambulatory Visit (INDEPENDENT_AMBULATORY_CARE_PROVIDER_SITE_OTHER): Payer: Medicare Other | Admitting: Vascular Surgery

## 2017-07-08 ENCOUNTER — Encounter: Payer: Self-pay | Admitting: Vascular Surgery

## 2017-07-08 VITALS — BP 120/71 | HR 67 | Temp 97.6°F | Resp 16 | Ht 64.0 in | Wt 172.0 lb

## 2017-07-08 DIAGNOSIS — I739 Peripheral vascular disease, unspecified: Secondary | ICD-10-CM | POA: Diagnosis not present

## 2017-07-08 NOTE — Progress Notes (Signed)
[facility] and Vein Specialist of [place]  Patient name: Kurt Burke MRN: [id number] DOB: 03/04/1929 Sex: male  REASON FOR CONSULT: Evaluation of left foot pain  HPI: Kurt Burke is a 84 y.o. male, who is here today for evaluation of left foot pain.  He is here with his niece.  This is been going on for several weeks.  He reports that this was quite severe initially but has actually improved over the last several weeks.  He reports a sensation of stinging and burning on the undersurface of his foot and also occasionally reports a sensation of coolness in his left foot.  He does walk with a walker chronically.  He has no tissue loss.  He denies any symptoms in his right foot.  Past Medical History:  Diagnosis Date  . Arthritis   . COPD (chronic obstructive pulmonary disease) ([place])   . GERD (gastroesophageal reflux disease)   . HOH (hard of hearing)   . Hypertension   . Shortness of breath    exertion    Family History  Problem Relation Age of Onset  . Heart disease Mother   . Heart disease Father   . Healthy Sister   . Arthritis Unknown     SOCIAL HISTORY: Social History   Socioeconomic History  . Marital status: Single    Spouse name: Not on file  . Number of children: Not on file  . Years of education: 27  . Highest education level: Not on file  Occupational History  . Not on file  Social Needs  . Financial resource strain: Not on file  . Food insecurity:    Worry: Not on file    Inability: Not on file  . Transportation needs:    Medical: Not on file    Non-medical: Not on file  Tobacco Use  . Smoking status: Former Smoker    Types: Cigarettes    Last attempt to quit: 10/22/1990    Years since quitting: 26.7  . Smokeless tobacco: Never Used  Substance and Sexual Activity  . Alcohol use: No    Alcohol/week: 0.0 oz  . Drug use: No  . Sexual activity: Not on file  Lifestyle  . Physical activity:    Days per  week: Not on file    Minutes per session: Not on file  . Stress: Not on file  Relationships  . Social connections:    Talks on phone: Not on file    Gets together: Not on file    Attends religious service: Not on file    Active member of club or organization: Not on file    Attends meetings of clubs or organizations: Not on file    Relationship status: Not on file  . Intimate partner violence:    Fear of current or ex partner: Not on file    Emotionally abused: Not on file    Physically abused: Not on file    Forced sexual activity: Not on file  Other Topics Concern  . Not on file  Social History Narrative  . Not on file    Allergies  Allergen Reactions  . Acetaminophen   . Codeine Other (See Comments)    Unknown reaction  . Ibuprofen     MD took patient off as he had a blood clot in his leg    Current Outpatient Medications  Medication Sig Dispense Refill  . carbamide peroxide (DEBROX) 6.5 % otic solution Place 5 drops into both ears  2 (two) times daily as needed (ear wax removal).     . cholecalciferol (VITAMIN D) 1000 units tablet Take 1,000 Units by mouth daily.    Marland Kitchen dexlansoprazole (DEXILANT) 60 MG capsule Take 60 mg by mouth daily.    . furosemide (LASIX) 40 MG tablet Take 40 mg by mouth daily.    Marland Kitchen levalbuterol (XOPENEX) 0.63 MG/3ML nebulizer solution Take 3 mLs (0.63 mg total) by nebulization every 8 (eight) hours as needed for wheezing or shortness of breath. 3 mL 12  . metoprolol succinate (TOPROL-XL) 50 MG 24 hr tablet Take 50 mg by mouth daily.    . Omega-3 Fatty Acids (FISH OIL) 1200 MG CPDR Take 1 capsule by mouth daily.     . potassium chloride SA (K-DUR,KLOR-CON) 20 MEQ tablet Take 20 mEq by mouth daily.     . ranitidine (ZANTAC) 150 MG tablet Take 150 mg by mouth 1 day or 1 dose. In evening    . umeclidinium-vilanterol (ANORO ELLIPTA) 62.5-25 MCG/INH AEPB Inhale 1 puff into the lungs daily.    Marland Kitchen ZETIA 10 MG tablet Take 10 mg by mouth daily.    . meclizine  (ANTIVERT) 25 MG tablet Take 25 mg by mouth 4 (four) times daily as needed for dizziness.      No current facility-administered medications for this visit.     REVIEW OF SYSTEMS:  [X]  denotes positive finding, [ ]  denotes negative finding Cardiac  Comments:  Chest pain or chest pressure:    Shortness of breath upon exertion:    Short of breath when lying flat:    Irregular heart rhythm:        Vascular    Pain in calf, thigh, or hip brought on by ambulation: x   Pain in feet at night that wakes you up from your sleep:  x   Blood clot in your veins:    Leg swelling:         Pulmonary    Oxygen at home: x   Productive cough:     Wheezing:  x       Neurologic    Sudden weakness in arms or legs:     Sudden numbness in arms or legs:     Sudden onset of difficulty speaking or slurred speech:    Temporary loss of vision in one eye:     Problems with dizziness:         Gastrointestinal    Blood in stool:     Vomited blood:         Genitourinary    Burning when urinating:     Blood in urine:        Psychiatric    Major depression:         Hematologic    Bleeding problems:    Problems with blood clotting too easily:        Skin    Rashes or ulcers:        Constitutional    Fever or chills:      PHYSICAL EXAM: Vitals:   07/08/17 1320  BP: 120/71  Pulse: 67  Resp: 16  Temp: 97.6 F (36.4 C)  SpO2: 96%  Weight: 172 lb (78 kg)  Height: 5\' 4"  (1.626 m)    GENERAL: The patient is a well-nourished male, in no acute distress.  He appears frail.  The vital signs are documented above. CARDIOVASCULAR: 2+ radial pulses bilaterally.  He has 2+ femoral pulses bilaterally.  He has a  2+ right popliteal pulse and I do not palpate right distal pulses.  He does not have a left popliteal pulse or distal pulses PULMONARY: There is good air exchange  ABDOMEN: Soft and non-tender  MUSCULOSKELETAL: There are no major deformities or cyanosis. NEUROLOGIC: No focal weakness or  paresthesias are detected. SKIN: There are no ulcers or rashes noted. PSYCHIATRIC: The patient has a normal affect.  DATA:  Noninvasive studies from [facility] were reviewed.  This reveals a normal ankle arm index on the right and an ankle arm index of 0.34 on the left  MEDICAL ISSUES: Had a long discussion with the patient and his niece present.  I explained that he certainly has moderate to severe left lower extremity arterial insufficiency.  His symptoms do not correspond with this.  He reports that this is improving and is not related to exercise.  This does not awaken him from sleep at night.  He actually reports that elevating his foot makes it feel better supposed to the typical dependency which improves sensation with arterial insufficiency.  He certainly would be at high risk for work-up and treatment.  He has known renal insufficiency with a creatinine as high as 2.3 within the last year to 2 years.  I have recommended close observation only at this time.  I will see him again in 1 month for further discussion to determine if this is continuing to improve or if he needs invasive work-up with arteriography.  I did explain that this in all likelihood where it would require either extensive endovascular treatment or bypass for improvement of flow.  Hopefully we can avoid this this very nice patient.   Rosetta Posner, MD FACS [facility] Tel [phone] Pager [phone]

## 2017-07-13 DIAGNOSIS — J449 Chronic obstructive pulmonary disease, unspecified: Secondary | ICD-10-CM | POA: Diagnosis not present

## 2017-07-13 DIAGNOSIS — K219 Gastro-esophageal reflux disease without esophagitis: Secondary | ICD-10-CM | POA: Diagnosis not present

## 2017-07-23 DIAGNOSIS — J418 Mixed simple and mucopurulent chronic bronchitis: Secondary | ICD-10-CM | POA: Diagnosis not present

## 2017-08-05 ENCOUNTER — Ambulatory Visit: Payer: Medicare Other | Admitting: Vascular Surgery

## 2017-08-13 ENCOUNTER — Ambulatory Visit: Payer: Medicare Other | Admitting: Vascular Surgery

## 2017-08-13 DIAGNOSIS — K219 Gastro-esophageal reflux disease without esophagitis: Secondary | ICD-10-CM | POA: Diagnosis not present

## 2017-08-13 DIAGNOSIS — J449 Chronic obstructive pulmonary disease, unspecified: Secondary | ICD-10-CM | POA: Diagnosis not present

## 2017-08-14 ENCOUNTER — Emergency Department (HOSPITAL_COMMUNITY): Payer: Medicare Other

## 2017-08-14 ENCOUNTER — Encounter (HOSPITAL_COMMUNITY): Payer: Self-pay

## 2017-08-14 ENCOUNTER — Inpatient Hospital Stay (HOSPITAL_COMMUNITY)
Admission: EM | Admit: 2017-08-14 | Discharge: 2017-08-19 | DRG: 640 | Disposition: A | Discharge status: Skilled Nursing Facility | Payer: Medicare Other | Attending: Internal Medicine | Admitting: Internal Medicine

## 2017-08-14 ENCOUNTER — Other Ambulatory Visit: Payer: Self-pay

## 2017-08-14 DIAGNOSIS — Z7189 Other specified counseling: Secondary | ICD-10-CM | POA: Diagnosis not present

## 2017-08-14 DIAGNOSIS — R55 Syncope and collapse: Secondary | ICD-10-CM | POA: Diagnosis not present

## 2017-08-14 DIAGNOSIS — H9190 Unspecified hearing loss, unspecified ear: Secondary | ICD-10-CM | POA: Diagnosis not present

## 2017-08-14 DIAGNOSIS — N179 Acute kidney failure, unspecified: Secondary | ICD-10-CM | POA: Diagnosis not present

## 2017-08-14 DIAGNOSIS — R4182 Altered mental status, unspecified: Secondary | ICD-10-CM | POA: Diagnosis present

## 2017-08-14 DIAGNOSIS — R339 Retention of urine, unspecified: Secondary | ICD-10-CM | POA: Diagnosis not present

## 2017-08-14 DIAGNOSIS — I10 Essential (primary) hypertension: Secondary | ICD-10-CM | POA: Diagnosis not present

## 2017-08-14 DIAGNOSIS — I82432 Acute embolism and thrombosis of left popliteal vein: Secondary | ICD-10-CM | POA: Diagnosis not present

## 2017-08-14 DIAGNOSIS — J449 Chronic obstructive pulmonary disease, unspecified: Secondary | ICD-10-CM | POA: Diagnosis not present

## 2017-08-14 DIAGNOSIS — Z885 Allergy status to narcotic agent status: Secondary | ICD-10-CM

## 2017-08-14 DIAGNOSIS — Z87891 Personal history of nicotine dependence: Secondary | ICD-10-CM | POA: Diagnosis not present

## 2017-08-14 DIAGNOSIS — M1990 Unspecified osteoarthritis, unspecified site: Secondary | ICD-10-CM | POA: Diagnosis not present

## 2017-08-14 DIAGNOSIS — E860 Dehydration: Secondary | ICD-10-CM | POA: Diagnosis not present

## 2017-08-14 DIAGNOSIS — K7460 Unspecified cirrhosis of liver: Secondary | ICD-10-CM | POA: Diagnosis present

## 2017-08-14 DIAGNOSIS — Z6824 Body mass index (BMI) 24.0-24.9, adult: Secondary | ICD-10-CM

## 2017-08-14 DIAGNOSIS — R410 Disorientation, unspecified: Secondary | ICD-10-CM | POA: Diagnosis not present

## 2017-08-14 DIAGNOSIS — K219 Gastro-esophageal reflux disease without esophagitis: Secondary | ICD-10-CM | POA: Diagnosis present

## 2017-08-14 DIAGNOSIS — Z515 Encounter for palliative care: Secondary | ICD-10-CM | POA: Diagnosis not present

## 2017-08-14 DIAGNOSIS — R531 Weakness: Secondary | ICD-10-CM | POA: Diagnosis not present

## 2017-08-14 DIAGNOSIS — I82412 Acute embolism and thrombosis of left femoral vein: Secondary | ICD-10-CM | POA: Diagnosis not present

## 2017-08-14 DIAGNOSIS — Z8249 Family history of ischemic heart disease and other diseases of the circulatory system: Secondary | ICD-10-CM

## 2017-08-14 DIAGNOSIS — Z886 Allergy status to analgesic agent status: Secondary | ICD-10-CM | POA: Diagnosis not present

## 2017-08-14 DIAGNOSIS — I824Z2 Acute embolism and thrombosis of unspecified deep veins of left distal lower extremity: Secondary | ICD-10-CM | POA: Diagnosis not present

## 2017-08-14 DIAGNOSIS — R609 Edema, unspecified: Secondary | ICD-10-CM

## 2017-08-14 DIAGNOSIS — E43 Unspecified severe protein-calorie malnutrition: Secondary | ICD-10-CM | POA: Diagnosis not present

## 2017-08-14 DIAGNOSIS — R4020 Unspecified coma: Secondary | ICD-10-CM | POA: Diagnosis not present

## 2017-08-14 DIAGNOSIS — F0390 Unspecified dementia without behavioral disturbance: Secondary | ICD-10-CM | POA: Diagnosis present

## 2017-08-14 LAB — BLOOD GAS, ARTERIAL
Acid-base deficit: 0.6 mmol/L (ref 0.0–2.0)
Bicarbonate: 24 mmol/L (ref 20.0–28.0)
DRAWN BY: 28459
O2 Saturation: 92.1 %
PATIENT TEMPERATURE: 37
PH ART: 7.428 (ref 7.350–7.450)
pCO2 arterial: 35.7 mmHg (ref 32.0–48.0)
pO2, Arterial: 65.1 mmHg — ABNORMAL LOW (ref 83.0–108.0)

## 2017-08-14 LAB — URINALYSIS, ROUTINE W REFLEX MICROSCOPIC
Bilirubin Urine: NEGATIVE
Glucose, UA: NEGATIVE mg/dL
Hgb urine dipstick: NEGATIVE
Ketones, ur: NEGATIVE mg/dL
LEUKOCYTES UA: NEGATIVE
NITRITE: NEGATIVE
Protein, ur: NEGATIVE mg/dL
SPECIFIC GRAVITY, URINE: 1.019 (ref 1.005–1.030)
pH: 5 (ref 5.0–8.0)

## 2017-08-14 LAB — BASIC METABOLIC PANEL
Anion gap: 8 (ref 5–15)
BUN: 35 mg/dL — AB (ref 8–23)
CHLORIDE: 104 mmol/L (ref 98–111)
CO2: 26 mmol/L (ref 22–32)
CREATININE: 1.68 mg/dL — AB (ref 0.61–1.24)
Calcium: 9.9 mg/dL (ref 8.9–10.3)
GFR calc Af Amer: 41 mL/min — ABNORMAL LOW (ref >60)
GFR calc non Af Amer: 35 mL/min — ABNORMAL LOW (ref >60)
GLUCOSE: 129 mg/dL — AB (ref 70–99)
POTASSIUM: 4.4 mmol/L (ref 3.5–5.1)
SODIUM: 138 mmol/L (ref 135–145)

## 2017-08-14 LAB — HEPATIC FUNCTION PANEL
ALBUMIN: 3 g/dL — AB (ref 3.5–5.0)
ALT: 27 U/L (ref 0–44)
AST: 31 U/L (ref 15–41)
Alkaline Phosphatase: 48 U/L (ref 38–126)
BILIRUBIN DIRECT: 0.3 mg/dL — AB (ref 0.0–0.2)
Indirect Bilirubin: 0.7 mg/dL (ref 0.3–0.9)
Total Bilirubin: 1 mg/dL (ref 0.3–1.2)
Total Protein: 6.8 g/dL (ref 6.5–8.1)

## 2017-08-14 LAB — TROPONIN I
TROPONIN I: 0.04 ng/mL — AB (ref <0.03)
Troponin I: 0.04 ng/mL (ref <0.03)

## 2017-08-14 LAB — CBC
HCT: 38.4 % — ABNORMAL LOW (ref 39.0–52.0)
HEMOGLOBIN: 11.7 g/dL — AB (ref 13.0–17.0)
MCH: 28.5 pg (ref 26.0–34.0)
MCHC: 30.5 g/dL (ref 30.0–36.0)
MCV: 93.7 fL (ref 78.0–100.0)
Platelets: 262 10*3/uL (ref 150–400)
RBC: 4.1 MIL/uL — AB (ref 4.22–5.81)
RDW: 15 % (ref 11.5–15.5)
WBC: 13.8 10*3/uL — ABNORMAL HIGH (ref 4.0–10.5)

## 2017-08-14 LAB — ETHANOL

## 2017-08-14 LAB — AMMONIA: Ammonia: 24 umol/L (ref 9–35)

## 2017-08-14 MED ORDER — METOPROLOL SUCCINATE ER 25 MG PO TB24
50.0000 mg | ORAL_TABLET | Freq: Every day | ORAL | Status: DC
  Administered 2017-08-15 – 2017-08-17 (×4): 50 mg via ORAL
  Filled 2017-08-14 (×4): qty 2

## 2017-08-14 MED ORDER — MECLIZINE HCL 12.5 MG PO TABS: 25.0000 mg | ORAL_TABLET | Freq: Four times a day (QID) | ORAL | Status: DC | PRN

## 2017-08-14 MED ORDER — UMECLIDINIUM-VILANTEROL 62.5-25 MCG/INH IN AEPB
1.0000 | INHALATION_SPRAY | Freq: Every day | RESPIRATORY_TRACT | Status: DC
  Administered 2017-08-15 – 2017-08-19 (×3): 1 via RESPIRATORY_TRACT
  Filled 2017-08-14: qty 14

## 2017-08-14 MED ORDER — VITAMIN D 1000 UNITS PO TABS
1000.0000 [IU] | ORAL_TABLET | Freq: Every day | ORAL | Status: DC
  Administered 2017-08-15 – 2017-08-19 (×6): 1000 [IU] via ORAL
  Filled 2017-08-14 (×8): qty 1

## 2017-08-14 MED ORDER — EZETIMIBE 10 MG PO TABS
10.0000 mg | ORAL_TABLET | Freq: Every day | ORAL | Status: DC
  Administered 2017-08-15 – 2017-08-18 (×5): 10 mg via ORAL
  Filled 2017-08-14 (×5): qty 1

## 2017-08-14 MED ORDER — SODIUM CHLORIDE 0.9 % IV BOLUS
500.0000 mL | Freq: Once | INTRAVENOUS | Status: AC
  Administered 2017-08-14: 500 mL via INTRAVENOUS

## 2017-08-14 MED ORDER — POTASSIUM CHLORIDE CRYS ER 20 MEQ PO TBCR
20.0000 meq | EXTENDED_RELEASE_TABLET | Freq: Every day | ORAL | Status: DC
  Administered 2017-08-15 – 2017-08-19 (×6): 20 meq via ORAL
  Filled 2017-08-14 (×6): qty 1

## 2017-08-14 MED ORDER — SODIUM CHLORIDE 0.9 % IV SOLN
INTRAVENOUS | Status: DC
  Administered 2017-08-15 – 2017-08-19 (×11): via INTRAVENOUS

## 2017-08-14 MED ORDER — CALCIUM CARBONATE-VITAMIN D 500-200 MG-UNIT PO TABS
1.0000 | ORAL_TABLET | Freq: Every day | ORAL | Status: DC
  Administered 2017-08-15 – 2017-08-19 (×6): 1 via ORAL
  Filled 2017-08-14 (×6): qty 1

## 2017-08-14 MED ORDER — CARBAMIDE PEROXIDE 6.5 % OT SOLN: 5.0000 [drp] | Freq: Two times a day (BID) | OTIC | Status: DC | PRN

## 2017-08-14 NOTE — ED Notes (Signed)
Pt cannot provide urine sample at this time. Urinal left at bedside.

## 2017-08-14 NOTE — ED Triage Notes (Addendum)
Ems reports caregivers  Told them pt hasn't been eating or drinking a lot the past few days and had a syncopal episode that lasted for a few seconds this morning.  Reports family recently took pt off of gabapentin because he was sleeping a lot.  Pt presently oriented but drowsy.  Pt falls asleep unless being spoken to.  Denies any pain, weakness, or sob.

## 2017-08-14 NOTE — ED Notes (Signed)
Patient to CT.

## 2017-08-14 NOTE — ED Provider Notes (Signed)
Blood pressure 130/88, pulse 95, temperature 98.4 F (36.9 C), temperature source Oral, resp. rate (!) 22, weight 78 kg, SpO2 95 %.  Assuming care from Dr. Lacinda Axon.  In short, Kurt Burke is a 84 y.o. male with a chief complaint of Loss of Consciousness .  Refer to the original H&P for additional details.  The current plan of care is to f/u CT head and reassess. Patient with confusion, weakness, and concern for syncope episodes this AM. History from family at bedside but events witness by home health aid. CT head negative for acute findings. Plan for MRI brain, troponin, and hepatic function panel. IVF running with family reporting poor PO intake recently.  Discussed patient's case with Hospitlaist, Dr. Shanon Brow to request admission. Patient and family (if present) updated with plan. Care transferred to Hospitalist service.  I reviewed all nursing notes, vitals, pertinent old records, EKGs, labs, imaging (as available).    Margette Fast, MD 08/15/17 1213

## 2017-08-14 NOTE — ED Notes (Signed)
CRITICAL VALUE ALERT  Critical Value:  Trop 0.04  Date & Time Notied:  1804, 08/14/17  Provider Notified: Dr. Laverta Baltimore  Orders Received/Actions taken: No new orders at this time

## 2017-08-14 NOTE — ED Notes (Signed)
Report given to floor, Brie, RN.

## 2017-08-14 NOTE — H&P (Signed)
History and Physical    Kurt Burke VQQ:[id number] DOB: Jan 13, 1929 DOA: 08/14/2017  PCP: Celene Squibb, MD  Patient coming from: Home  Chief Complaint: Confusion  HPI: Kurt Burke is a 84 y.o. male with medical history significant of COPD, cirrhosis per chart brought in by family members for confusion.  Cannot obtain any history from patient due to his confusion at this time all history is obtained from chart and nursing staff.  And ED staff.  Family reported that he has not been eating well he has been weaker than normal with confusion.  Patient brought to the emergency department for further evaluation.  Dr. Laverta Baltimore in the ED referred for request for further imaging such as an MRI.  Work-up in the ED revealing mild renal injury and dehydration otherwise no source of infection.  Review of Systems: Unknown secondary to patient's confusion  Past Medical History:  Diagnosis Date  . Arthritis   . COPD (chronic obstructive pulmonary disease) ([place])   . GERD (gastroesophageal reflux disease)   . HOH (hard of hearing)   . Hypertension   . Shortness of breath    exertion    Past Surgical History:  Procedure Laterality Date  . COLONOSCOPY  10/07/2011   Procedure: COLONOSCOPY;  Surgeon: Rogene Houston, MD;  [facility];  Service: Endoscopy;  Laterality: N/A;  730  . CYSTOSCOPY  2000   Preformed to remove cyst from liver at [facility]     reports that he quit smoking about 26 years ago. His smoking use included cigarettes. He has never used smokeless tobacco. He reports that he does not drink alcohol or use drugs.  Allergies  Allergen Reactions  . Acetaminophen   . Codeine Other (See Comments)    Unknown reaction  . Ibuprofen     MD took patient off as he had a blood clot in his leg    Family History  Problem Relation Age of Onset  . Heart disease Mother   . Heart disease Father   . Healthy Sister   . Arthritis Unknown      Prior to Admission medications   Medication Sig Start Date End Date Taking? Authorizing Provider  Calcium Carbonate-Vitamin D (CALCIUM 500 + D) 500-125 MG-UNIT TABS Take 1 tablet by mouth daily.   Yes [provider]  carbamide peroxide (DEBROX) 6.5 % otic solution Place 5 drops into both ears 2 (two) times daily as needed (ear wax removal).    Yes [provider]  cholecalciferol (VITAMIN D) 1000 units tablet Take 1,000 Units by mouth daily.   Yes [provider]  dexlansoprazole (DEXILANT) 60 MG capsule Take 60 mg by mouth daily.   Yes [provider]  furosemide (LASIX) 40 MG tablet Take 40 mg by mouth daily.   Yes [provider]  levalbuterol (XOPENEX) 0.63 MG/3ML nebulizer solution Take 3 mLs (0.63 mg total) by nebulization every 8 (eight) hours as needed for wheezing or shortness of breath. 02/27/15  Yes Kelvin Cellar, MD  metoprolol succinate (TOPROL-XL) 50 MG 24 hr tablet Take 50 mg by mouth daily. 09/12/11  Yes [provider]  Multiple Vitamin (MULTI-DAY VITAMINS PO) Take 1 tablet by mouth daily.   Yes [provider]  potassium chloride SA (K-DUR,KLOR-CON) 20 MEQ tablet Take 20 mEq by mouth daily.    Yes [provider]  umeclidinium-vilanterol (ANORO ELLIPTA) 62.5-25 MCG/INH AEPB Inhale 1 puff  into the lungs daily.   Yes [provider]  ZETIA 10 MG tablet Take 10 mg by mouth daily. 08/26/11  Yes [provider]  meclizine (ANTIVERT) 25 MG tablet Take 25 mg by mouth 4 (four) times daily as needed for dizziness.     [provider]    Physical Exam: Vitals:   08/14/17 1630 08/14/17 1645 08/14/17 1700 08/14/17 1839  BP: 130/88  136/78 (!) 146/80  Pulse:  95  95  Resp:  (!) 22 (!) 23 (!) 23  Temp:    98.1 F (36.7 C)  TempSrc:    Oral  SpO2:  95%  95%  Weight:          Constitutional: NAD, calm, comfortable Vitals:   08/14/17 1630 08/14/17 1645 08/14/17 1700 08/14/17 1839   BP: 130/88  136/78 (!) 146/80  Pulse:  95  95  Resp:  (!) 22 (!) 23 (!) 23  Temp:    98.1 F (36.7 C)  TempSrc:    Oral  SpO2:  95%  95%  Weight:       Eyes: PERRL, lids and conjunctivae normal ENMT: Mucous membranes are dry so. Posterior pharynx clear of any exudate or lesions.Normal dentition.  Neck: normal, supple, no masses, no thyromegaly Respiratory: clear to auscultation bilaterally, no wheezing, no crackles. Normal respiratory effort. No accessory muscle use.  Cardiovascular: Regular rate and rhythm, no murmurs / rubs / gallops. No extremity edema. 2+ pedal pulses. No carotid bruits.  Abdomen: no tenderness, no masses palpated. No hepatosplenomegaly. Bowel sounds positive.  Musculoskeletal: no clubbing / cyanosis. No joint deformity upper and lower extremities. Good ROM, no contractures. Normal muscle tone.  Skin: no rashes, lesions, ulcers. No induration Neurologic: CN 2-12 grossly intact. Sensation intact, DTR normal. Strength 5/5 in all 4.  Psychiatric: Not normal judgment and insight. Alert and oriented x 1 . Normal mood.    Labs on Admission: I have personally reviewed following labs and imaging studies  CBC: Recent Labs  Lab 08/14/17 1158  WBC 13.8*  HGB 11.7*  HCT 38.4*  MCV 93.7  PLT 176   Basic Metabolic Panel: Recent Labs  Lab 08/14/17 1158  NA 138  K 4.4  CL 104  CO2 26  GLUCOSE 129*  BUN 35*  CREATININE 1.68*  CALCIUM 9.9   GFR: Estimated Creatinine Clearance: 30.3 mL/min (A) (by C-G formula based on SCr of 1.68 mg/dL (H)). Liver Function Tests: Recent Labs  Lab 08/14/17 1657  AST 31  ALT 27  ALKPHOS 48  BILITOT 1.0  PROT 6.8  ALBUMIN 3.0*   No results for input(s): LIPASE, AMYLASE in the last 168 hours. No results for input(s): AMMONIA in the last 168 hours. Coagulation Profile: No results for input(s): INR, PROTIME in the last 168 hours. Cardiac Enzymes: Recent Labs  Lab 08/14/17 1657  TROPONINI 0.04*   BNP (last 3  results) No results for input(s): PROBNP in the last 8760 hours. HbA1C: No results for input(s): HGBA1C in the last 72 hours. CBG: No results for input(s): GLUCAP in the last 168 hours. Lipid Profile: No results for input(s): CHOL, HDL, LDLCALC, TRIG, CHOLHDL, LDLDIRECT in the last 72 hours. Thyroid Function Tests: No results for input(s): TSH, T4TOTAL, FREET4, T3FREE, THYROIDAB in the last 72 hours. Anemia Panel: No results for input(s): VITAMINB12, FOLATE, FERRITIN, TIBC, IRON, RETICCTPCT in the last 72 hours. Urine analysis:    Component Value Date/Time   COLORURINE YELLOW 08/14/2017 [place] 08/14/2017  1355   LABSPEC 1.019 08/14/2017 1355   PHURINE 5.0 08/14/2017 1355   GLUCOSEU NEGATIVE 08/14/2017 [place] 08/14/2017 [place] 08/14/2017 1355   KETONESUR NEGATIVE 08/14/2017 1355   PROTEINUR NEGATIVE 08/14/2017 1355   UROBILINOGEN 0.2 07/21/2012 1540   NITRITE NEGATIVE 08/14/2017 1355   LEUKOCYTESUR NEGATIVE 08/14/2017 1355   Sepsis Labs: !!!!!!!!!!!!!!!!!!!!!!!!!!!!!!!!!!!!!!!!!!!! @LABRCNTIP (procalcitonin:4,lacticidven:4) )No results found for this or any previous visit (from the past 240 hour(s)).   Radiological Exams on Admission: Ct Head Wo Contrast  Result Date: 08/14/2017 CLINICAL DATA:  84 year old male with altered level of consciousness today. Initial encounter. EXAM: CT HEAD WITHOUT CONTRAST TECHNIQUE: Contiguous axial images were obtained from the base of the skull through the vertex without intravenous contrast. COMPARISON:  06/15/2012 head CT. FINDINGS: Brain: No intracranial hemorrhage or CT evidence of large acute infarct. Remote right thalamic infarct. Moderate chronic microvascular changes. Moderate global atrophy. No intracranial mass lesion noted on this unenhanced exam. Vascular: Vascular calcifications.  No hyperdense vessel. Skull: No acute abnormality. Sinuses/Orbits: Post lens replacement. No acute orbital  abnormality. Minimal mucosal thickening right maxillary sinus. Other: Mastoid air cells and minimally cavities are clear. Prominent scalp calcifications once again noted. IMPRESSION: 1. No acute intracranial abnormality. 2. Remote small right thalamic infarct. Moderate chronic microvascular changes. 3. Moderate global atrophy. Electronically Signed   By: Genia Del M.D.   On: 08/14/2017 16:42   Dg Chest Port 1 View  Result Date: 08/14/2017 CLINICAL DATA:  COPD, former smoker. Altered mental status. History of COPD. EXAM: PORTABLE CHEST 1 VIEW COMPARISON:  Frontal chest x-ray of November 26, 2015 FINDINGS: The right hemidiaphragm remains higher than the left. There is no focal infiltrate. There is no pleural effusion. The heart and pulmonary vascularity are normal. There is tortuosity of the descending thoracic aorta with mural calcification. The bony thorax exhibits no acute abnormality. IMPRESSION: Chronic elevation of the right hemidiaphragm.  No pneumonia nor CHF. Thoracic aortic atherosclerosis. Electronically Signed   By: Rahiem Schellinger  [place] M.D.   On: 08/14/2017 14:07    Old chart reviewed  Case discussed with Dr. Laverta Baltimore in the ED Chest x-ray reviewed no edema or infiltrate repeat  Assessment/Plan 84 year old male with altered mental status/delirium of unclear etiology  Principal Problem:   Altered mental status-patient has no focal neurological deficits he can follow commands and participates in following instructions however is only oriented to person.  Due to his history of alcohol abuse check alcohol level.  He does deny drinking for many years.  We will also check a urine drug screen.  We will also check an ammonia level.  We will also check an ABG due to his history of COPD.  There is no source of infection to explain however he is dehydrated which could be cause.  Provide IV fluids overnight repeat BMP in the morning and reassess mental status.  Patient's afebrile vital signs are  stable.  Active Problems:   Cirrhosis (HCC)-this is documented in his chart but on exam patient does not appear to be cirrhotic.  Check ammonia level for thoroughness.    AKI (acute kidney injury) (HCC)-Baseline creatinine around 1.3 bumped up to 1.7 right now.  IV fluids overnight.  UA is negative for infection.  Mildly dehydrated.    Dehydration-as above    If patient does not respond to IV fluid as above and does not have improvement in his mental status would consider further imaging with MRI   DVT prophylaxis: SCDs Code  Status: Full Family Communication: None Disposition Plan: Likely tomorrow Consults called: None Admission status: Observation   Rikki Smestad A MD [facility]  If 7PM-7AM, please contact night-coverage [email or web address] Password [email or web address]  08/14/2017, 7:20 PM

## 2017-08-14 NOTE — ED Provider Notes (Signed)
[facility] EMERGENCY DEPARTMENT Provider Note   CSN: [id number] Arrival date & time: 08/14/17  1046     History   Chief Complaint Chief Complaint  Patient presents with  . Loss of Consciousness    HPI Kurt Burke is a 84 y.o. male.  Level 5 caveat for mild dementia and confusion.  Most of history obtained from niece.  Niece reports episodes of briefly "passing out" with spontaneous recovery.  These events are not associated with any activity.  No extremity weakness, facial asymmetry, slurred speech.  Patient lives in a senior living facility and is normally ambulatory.  He was given a walker yesterday but has had trouble ambulating.  No prodromal illnesses.  Decreased oral intake recently.  Gabapentin was recently discontinued because he was sleeping excessively.  No fever, sweats, chills, chest pain, dyspnea, dysuria.     Past Medical History:  Diagnosis Date  . Arthritis   . COPD (chronic obstructive pulmonary disease) ([place])   . GERD (gastroesophageal reflux disease)   . HOH (hard of hearing)   . Hypertension   . Shortness of breath    exertion    Patient Active Problem List   Diagnosis Date Noted  . SOB (shortness of breath) 02/26/2015  . AKI (acute kidney injury) ([place]) 02/26/2015  . FTT (failure to thrive) in adult 02/26/2015  . Weakness 02/26/2015  . Dehydration 02/26/2015  . Altered mental status 06/16/2012  . URI (upper respiratory infection) 06/16/2012  . Fever 06/16/2012  . Rectal bleeding 09/26/2011  . Rotator cuff insufficiency 09/23/2011  . GERD (gastroesophageal reflux disease) 10/23/2010  . Cirrhosis ([place]) 10/23/2010  . SHOULDER PAIN 10/17/2008  . IMPINGEMENT SYNDROME 10/17/2008  . RUPTURE ROTATOR CUFF 10/17/2008    Past Surgical History:  Procedure Laterality Date  . COLONOSCOPY  10/07/2011   Procedure: COLONOSCOPY;  Surgeon: Rogene Houston, MD;  [facility];  Service: Endoscopy;  Laterality: N/A;  730  . CYSTOSCOPY  2000   Preformed to remove cyst from liver at [place] Medications    Prior to Admission medications   Medication Sig Start Date End Date Taking? Authorizing Provider  Calcium Carbonate-Vitamin D (CALCIUM 500 + D) 500-125 MG-UNIT TABS Take 1 tablet by mouth daily.   Yes [provider]  carbamide peroxide (DEBROX) 6.5 % otic solution Place 5 drops into both ears 2 (two) times daily as needed (ear wax removal).    Yes [provider]  cholecalciferol (VITAMIN D) 1000 units tablet Take 1,000 Units by mouth daily.   Yes [provider]  dexlansoprazole (DEXILANT) 60 MG capsule Take 60 mg by mouth daily.   Yes [provider]  furosemide (LASIX) 40 MG tablet Take 40 mg by mouth daily.   Yes [provider]  levalbuterol (XOPENEX) 0.63 MG/3ML nebulizer solution Take 3 mLs (0.63 mg total) by nebulization every 8 (eight) hours as needed for wheezing or shortness of breath. 02/27/15  Yes Kelvin Cellar, MD  metoprolol succinate (TOPROL-XL) 50 MG 24 hr tablet Take 50 mg by mouth daily. 09/12/11  Yes [provider]  Multiple Vitamin (MULTI-DAY VITAMINS PO) Take 1 tablet by mouth daily.   Yes [provider]  potassium chloride SA (K-DUR,KLOR-CON) 20 MEQ tablet Take 20 mEq by mouth daily.    Yes [provider]  umeclidinium-vilanterol (ANORO ELLIPTA) 62.5-25 MCG/INH AEPB Inhale 1 puff into the lungs  daily.   Yes [provider]  ZETIA 10 MG tablet Take 10 mg by mouth daily. 08/26/11  Yes [provider]  meclizine (ANTIVERT) 25 MG tablet Take 25 mg by mouth 4 (four) times daily as needed for dizziness.     [provider]    Family History Family History  Problem Relation Age of Onset  . Heart disease Mother   . Heart disease Father   . Healthy Sister   . Arthritis Unknown     Social History Social History   Tobacco Use  . Smoking status: Former  Smoker    Types: Cigarettes    Last attempt to quit: 10/22/1990    Years since quitting: 26.8  . Smokeless tobacco: Never Used  Substance Use Topics  . Alcohol use: No    Alcohol/week: 0.0 standard drinks  . Drug use: No     Allergies   Acetaminophen; Codeine; and Ibuprofen   Review of Systems Review of Systems  Unable to perform ROS: Mental status change     Physical Exam Updated Vital Signs BP (!) 155/82   Pulse 98   Temp 98.4 F (36.9 C) (Oral)   Resp 17   Wt 78 kg   SpO2 98%   BMI 29.52 kg/m   Physical Exam  Constitutional:  Patient unable to give full history, but he is able to talk.  HENT:  Head: Normocephalic and atraumatic.  Eyes: Conjunctivae are normal.  Neck: Neck supple.  Cardiovascular: Normal rate and regular rhythm.  Pulmonary/Chest: Effort normal and breath sounds normal.  Abdominal: Soft. Bowel sounds are normal.  Musculoskeletal: Normal range of motion.  Neurological: He is alert.  Oriented x2.  Did not know day.  Able to move arms and legs  Skin: Skin is warm and dry.  Psychiatric:  Flat affect.  Nursing note and vitals reviewed.    ED Treatments / Results  Labs (all labs ordered are listed, but only abnormal results are displayed) Labs Reviewed  BASIC METABOLIC PANEL - Abnormal; Notable for the following components:      Result Value   Glucose, Bld 129 (*)    BUN 35 (*)    Creatinine, Ser 1.68 (*)    GFR calc non Af Amer 35 (*)    GFR calc Af Amer 41 (*)    All other components within normal limits  CBC - Abnormal; Notable for the following components:   WBC 13.8 (*)    RBC 4.10 (*)    Hemoglobin 11.7 (*)    HCT 38.4 (*)    All other components within normal limits  URINALYSIS, ROUTINE W REFLEX MICROSCOPIC    EKG EKG Interpretation  Date/Time:  Thursday August 14 2017 11:03:37 EDT Ventricular Rate:  104 PR Interval:    QRS Duration: 89 QT Interval:  347 QTC Calculation: 457 R Axis:   -48 Text Interpretation:   Sinus tachycardia Atrial premature complex Probable left atrial enlargement Abnormal R-wave progression, early transition Inferior infarct, old Consider anterior infarct Baseline wander in lead(s) V1 V2 Confirmed by Nat Christen [phone]) on 08/14/2017 1:31:53 PM   Radiology Dg Chest Port 1 View  Result Date: 08/14/2017 CLINICAL DATA:  COPD, former smoker. Altered mental status. History of COPD. EXAM: PORTABLE CHEST 1 VIEW COMPARISON:  Frontal chest x-ray of November 26, 2015 FINDINGS: The right hemidiaphragm remains higher than the left. There is no focal infiltrate. There is no pleural effusion. The heart and pulmonary vascularity are normal. There is tortuosity of  the descending thoracic aorta with mural calcification. The bony thorax exhibits no acute abnormality. IMPRESSION: Chronic elevation of the right hemidiaphragm.  No pneumonia nor CHF. Thoracic aortic atherosclerosis. Electronically Signed   By: David  Martinique M.D.   On: 08/14/2017 14:07    Procedures Procedures (including critical care time)  Medications Ordered in ED Medications  sodium chloride 0.9 % bolus 500 mL (500 mLs Intravenous New Bag/Given 08/14/17 1410)     Initial Impression / Assessment and Plan / ED Course  I have reviewed the triage vital signs and the nursing notes.  Pertinent labs & imaging results that were available during my care of the patient were reviewed by me and considered in my medical decision making (see chart for details).     Uncertain etiology of patient's brief syncopal spells.  Also his niece describes a pattern of failing health over the past week.  He is now unable to independently ambulate.  Urinalysis and chest x-ray showed no obvious source for infection.  CT head pending.  Discussed with Dr. Laverta Baltimore.  Probable admission.  Final Clinical Impressions(s) / ED Diagnoses   Final diagnoses:  Syncope, unspecified syncope type    ED Discharge Orders    None       Nat Christen, MD 08/14/17  1549

## 2017-08-14 NOTE — ED Notes (Signed)
Patient back on monitor. Family at bedside.

## 2017-08-14 NOTE — ED Notes (Signed)
Family member expressed concern over patient going home, lives by himself. Aide found him in his chair this am, had not been to bed or eaten. Patient confused at present. Has pulled wires and cords, attempted to remove IV during last rounding.

## 2017-08-14 NOTE — ED Notes (Signed)
Patient awake and pulling off leads and wires. Family at bedside.

## 2017-08-15 ENCOUNTER — Other Ambulatory Visit: Payer: Self-pay

## 2017-08-15 ENCOUNTER — Observation Stay (HOSPITAL_COMMUNITY): Payer: Medicare Other

## 2017-08-15 DIAGNOSIS — J449 Chronic obstructive pulmonary disease, unspecified: Secondary | ICD-10-CM

## 2017-08-15 DIAGNOSIS — E860 Dehydration: Principal | ICD-10-CM

## 2017-08-15 DIAGNOSIS — N179 Acute kidney failure, unspecified: Secondary | ICD-10-CM

## 2017-08-15 LAB — CBC
HEMATOCRIT: 35.6 % — AB (ref 39.0–52.0)
HEMOGLOBIN: 11 g/dL — AB (ref 13.0–17.0)
MCH: 28.8 pg (ref 26.0–34.0)
MCHC: 30.9 g/dL (ref 30.0–36.0)
MCV: 93.2 fL (ref 78.0–100.0)
Platelets: 256 10*3/uL (ref 150–400)
RBC: 3.82 MIL/uL — AB (ref 4.22–5.81)
RDW: 14.9 % (ref 11.5–15.5)
WBC: 8.3 10*3/uL (ref 4.0–10.5)

## 2017-08-15 LAB — BASIC METABOLIC PANEL
ANION GAP: 8 (ref 5–15)
BUN: 27 mg/dL — ABNORMAL HIGH (ref 8–23)
CO2: 23 mmol/L (ref 22–32)
Calcium: 9.2 mg/dL (ref 8.9–10.3)
Chloride: 110 mmol/L (ref 98–111)
Creatinine, Ser: 1.27 mg/dL — ABNORMAL HIGH (ref 0.61–1.24)
GFR, EST AFRICAN AMERICAN: 58 mL/min — AB (ref >60)
GFR, EST NON AFRICAN AMERICAN: 50 mL/min — AB (ref >60)
Glucose, Bld: 98 mg/dL (ref 70–99)
POTASSIUM: 3.9 mmol/L (ref 3.5–5.1)
Sodium: 141 mmol/L (ref 135–145)

## 2017-08-15 LAB — TROPONIN I
Troponin I: 0.04 ng/mL (ref <0.03)
Troponin I: 0.04 ng/mL (ref <0.03)

## 2017-08-15 MED ORDER — LORAZEPAM 2 MG/ML IJ SOLN
1.0000 mg | Freq: Once | INTRAMUSCULAR | Status: AC
  Administered 2017-08-15: 1 mg via INTRAVENOUS
  Filled 2017-08-15: qty 1

## 2017-08-15 MED ORDER — ENSURE ENLIVE PO LIQD
237.0000 mL | Freq: Two times a day (BID) | ORAL | Status: DC
  Administered 2017-08-15: 237 mL via ORAL

## 2017-08-15 MED ORDER — LORAZEPAM 2 MG/ML IJ SOLN
1.0000 mg | Freq: Once | INTRAMUSCULAR | Status: AC
  Administered 2017-08-15: 1 mg via INTRAVENOUS

## 2017-08-15 MED ORDER — ENOXAPARIN SODIUM 40 MG/0.4ML ~~LOC~~ SOLN
40.0000 mg | SUBCUTANEOUS | Status: DC
  Administered 2017-08-15 – 2017-08-17 (×2): 40 mg via SUBCUTANEOUS
  Filled 2017-08-15 (×3): qty 0.4

## 2017-08-15 MED ORDER — ACETAMINOPHEN 325 MG PO TABS
650.0000 mg | ORAL_TABLET | Freq: Four times a day (QID) | ORAL | Status: DC | PRN
  Administered 2017-08-17: 650 mg via ORAL
  Filled 2017-08-15: qty 2

## 2017-08-15 MED ORDER — ENSURE ENLIVE PO LIQD
237.0000 mL | Freq: Three times a day (TID) | ORAL | Status: DC
  Administered 2017-08-15 – 2017-08-19 (×8): 237 mL via ORAL

## 2017-08-15 NOTE — Plan of Care (Signed)
  Problem: Acute Rehab PT Goals(only PT should resolve) Goal: Pt Will Go Supine/Side To Sit Outcome: Progressing Flowsheets (Taken 08/15/2017 1215) Pt will go Supine/Side to Sit: with supervision Goal: Patient Will Transfer Sit To/From Stand Outcome: Progressing Flowsheets (Taken 08/15/2017 1215) Patient will transfer sit to/from stand: with min guard assist Goal: Pt Will Transfer Bed To Chair/Chair To Bed Outcome: Progressing Flowsheets (Taken 08/15/2017 1215) Pt will Transfer Bed to Chair/Chair to Bed: with modified independence Goal: Pt Will Ambulate Outcome: Progressing Flowsheets (Taken 08/15/2017 1215) Pt will Ambulate: 75 feet; with minimal assist; with rolling walker   12:16 PM, 08/15/17 Lonell Grandchild, MPT Physical Therapist with [facility] [phone] office [phone] mobile phone

## 2017-08-15 NOTE — Progress Notes (Signed)
PROGRESS NOTE    Kurt Burke  OVZ:[id number] DOB: 1929/05/26 DOA: 08/14/2017 PCP: Celene Squibb, MD    Brief Narrative:  84 year old male with a history of COPD, hypertension, brought to the hospital with increasing confusion.  Found to have dehydration and acute kidney injury.  Started on IV fluids.  Remains confused at this time.  Work-up has been unrevealing.  Unable to tolerate MRI.  Will discuss with family regarding his baseline mental status.  Seen by physical therapy who recommended skilled nursing facility placement.   Assessment & Plan:   Principal Problem:   Altered mental status Active Problems:   Cirrhosis (HCC)   AKI (acute kidney injury) ([place])   Dehydration   1. Altered mental status.  Patient remains confused.  Unclear what is his baseline mental status.  We will try and reach out to family to gather more information.  Thus far work-up has been unrevealing.  He does not have any signs of infection.  Metabolic work-up has been unrevealing.  He cannot undergo MRI due to severe agitation.  He did receive benzodiazepines prior to scan, but did not tolerate this. 2. Acute kidney injury.  Possibly contributing to #1.  Improving with IV fluids.  Continue current treatments.  Urinalysis is unremarkable. 3. Hypertension.  Blood pressure currently stable.  Continue on metoprolol. 4. COPD.  ABG did not show any significant CO2 retention.  No shortness of breath or wheezing.  Continue on Anoro Ellipta. 5. Generalized weakness.  Seen by physical therapy recommended skilled nursing facility placement.   DVT prophylaxis: Lovenox Code Status: Full code Family Communication: No family present Disposition Plan: Needs skilled nursing facility placement on discharge   Consultants:     Procedures:     Antimicrobials:       Subjective: Confused, cannot provide any reliable history  Objective: Vitals:   08/14/17 2012 08/15/17 0627 08/15/17 1048 08/15/17 1338  BP:  115/82 122/73  (!) 141/86  Pulse: 99 99  89  Resp:    16  Temp: 98.5 F (36.9 C) 98.6 F (37 C)  98.2 F (36.8 C)  TempSrc: Oral Oral  Oral  SpO2: 98% 98% 94% 100%  Weight: 71.8 kg     Height: 5\' 8"  (1.727 m)       Intake/Output Summary (Last 24 hours) at 08/15/2017 2048 Last data filed at 08/15/2017 1900 Gross per 24 hour  Intake 2736.67 ml  Output 200 ml  Net 2536.67 ml   Filed Weights   08/14/17 1053 08/14/17 2012  Weight: 78 kg 71.8 kg    Examination:  General exam: Patient does not appear to be in any distress Respiratory system: Clear to auscultation. Respiratory effort normal. Cardiovascular system: S1 & S2 heard, RRR. No JVD, murmurs, rubs, gallops or clicks. No pedal edema. Gastrointestinal system: Abdomen is nondistended, soft and nontender. No organomegaly or masses felt. Normal bowel sounds heard. Central nervous system: No focal neurological deficits. Extremities: Symmetric 5 x 5 power. Skin: No rashes, lesions or ulcers Psychiatry: Confused    Data Reviewed: I have personally reviewed following labs and imaging studies  CBC: Recent Labs  Lab 08/14/17 1158 08/15/17 0133  WBC 13.8* 8.3  HGB 11.7* 11.0*  HCT 38.4* 35.6*  MCV 93.7 93.2  PLT 262 412   Basic Metabolic Panel: Recent Labs  Lab 08/14/17 1158 08/15/17 0133  NA 138 141  K 4.4 3.9  CL 104 110  CO2 26 23  GLUCOSE 129* 98  BUN 35* 27*  CREATININE 1.68* 1.27*  CALCIUM 9.9 9.2   GFR: Estimated Creatinine Clearance: 41.1 mL/min (A) (by C-G formula based on SCr of 1.27 mg/dL (H)). Liver Function Tests: Recent Labs  Lab 08/14/17 1657  AST 31  ALT 27  ALKPHOS 48  BILITOT 1.0  PROT 6.8  ALBUMIN 3.0*   No results for input(s): LIPASE, AMYLASE in the last 168 hours. Recent Labs  Lab 08/14/17 1857  AMMONIA 24   Coagulation Profile: No results for input(s): INR, PROTIME in the last 168 hours. Cardiac Enzymes: Recent Labs  Lab 08/14/17 1657 08/14/17 1857 08/15/17 0133  08/15/17 0811  TROPONINI 0.04* 0.04* 0.04* 0.04*   BNP (last 3 results) No results for input(s): PROBNP in the last 8760 hours. HbA1C: No results for input(s): HGBA1C in the last 72 hours. CBG: No results for input(s): GLUCAP in the last 168 hours. Lipid Profile: No results for input(s): CHOL, HDL, LDLCALC, TRIG, CHOLHDL, LDLDIRECT in the last 72 hours. Thyroid Function Tests: No results for input(s): TSH, T4TOTAL, FREET4, T3FREE, THYROIDAB in the last 72 hours. Anemia Panel: No results for input(s): VITAMINB12, FOLATE, FERRITIN, TIBC, IRON, RETICCTPCT in the last 72 hours. Sepsis Labs: No results for input(s): PROCALCITON, LATICACIDVEN in the last 168 hours.  No results found for this or any previous visit (from the past 240 hour(s)).       Radiology Studies: Ct Head Wo Contrast  Result Date: 08/14/2017 CLINICAL DATA:  84 year old male with altered level of consciousness today. Initial encounter. EXAM: CT HEAD WITHOUT CONTRAST TECHNIQUE: Contiguous axial images were obtained from the base of the skull through the vertex without intravenous contrast. COMPARISON:  06/15/2012 head CT. FINDINGS: Brain: No intracranial hemorrhage or CT evidence of large acute infarct. Remote right thalamic infarct. Moderate chronic microvascular changes. Moderate global atrophy. No intracranial mass lesion noted on this unenhanced exam. Vascular: Vascular calcifications.  No hyperdense vessel. Skull: No acute abnormality. Sinuses/Orbits: Post lens replacement. No acute orbital abnormality. Minimal mucosal thickening right maxillary sinus. Other: Mastoid air cells and minimally cavities are clear. Prominent scalp calcifications once again noted. IMPRESSION: 1. No acute intracranial abnormality. 2. Remote small right thalamic infarct. Moderate chronic microvascular changes. 3. Moderate global atrophy. Electronically Signed   By: Genia Del M.D.   On: 08/14/2017 16:42   Dg Chest Port 1 View  Result Date:  08/14/2017 CLINICAL DATA:  COPD, former smoker. Altered mental status. History of COPD. EXAM: PORTABLE CHEST 1 VIEW COMPARISON:  Frontal chest x-ray of November 26, 2015 FINDINGS: The right hemidiaphragm remains higher than the left. There is no focal infiltrate. There is no pleural effusion. The heart and pulmonary vascularity are normal. There is tortuosity of the descending thoracic aorta with mural calcification. The bony thorax exhibits no acute abnormality. IMPRESSION: Chronic elevation of the right hemidiaphragm.  No pneumonia nor CHF. Thoracic aortic atherosclerosis. Electronically Signed   By: David  Martinique M.D.   On: 08/14/2017 14:07        Scheduled Meds: . calcium-vitamin D  1 tablet Oral Daily  . cholecalciferol  1,000 Units Oral Daily  . ezetimibe  10 mg Oral QHS  . feeding supplement (ENSURE ENLIVE)  237 mL Oral TID BM  . metoprolol succinate  50 mg Oral Daily  . potassium chloride SA  20 mEq Oral Daily  . umeclidinium-vilanterol  1 puff Inhalation Daily   Continuous Infusions: . sodium chloride 100 mL/hr at 08/15/17 1850     LOS: 0 days    Time spent: 68mins  Kathie Dike, MD [facility] Pager [phone]  If 7PM-7AM, please contact night-coverage [email or web address] Password [facility] 08/15/2017, 8:48 PM

## 2017-08-15 NOTE — Care Management Obs Status (Signed)
[facility] NOTIFICATION   Patient Details  Name: Kurt Burke MRN: [id number] Date of Birth: September 22, 1929   Medicare Observation Status Notification Given:  Yes    Sherald Barge, RN 08/15/2017, 3:50 PM

## 2017-08-15 NOTE — Progress Notes (Signed)
Patient c/o leg pain upon standing, making it difficult for patient to stand.  Unable to do orthostatic vitals, patient unable to bear weight upon leg/foot.

## 2017-08-15 NOTE — Evaluation (Signed)
Physical Therapy Evaluation Patient Details Name: Kurt Burke MRN: [id number] DOB: 1929-07-16 Today's Date: 08/15/2017   History of Present Illness  Kurt Burke is a 84 y.o. male with medical history significant of COPD, cirrhosis per chart brought in by family members for confusion.  Cannot obtain any history from patient due to his confusion at this time all history is obtained from chart and nursing staff.  And ED staff.  Family reported that he has not been eating well he has been weaker than normal with confusion.  Patient brought to the emergency department for further evaluation.  Dr. Laverta Baltimore in the ED referred for request for further imaging such as an MRI.  Work-up in the ED revealing mild renal injury and dehydration otherwise no source of infection.    Clinical Impression  Patient presents slightly confused with his niece at beside, demonstrates slow labored movement, poor standing balance secondary to BLE weakness, able to ambulate in hallway with much verbal/tactile cueing for safety, limited secondary to fatigue and put back to bed after therapy with niece in room.  Patient will benefit from continued physical therapy in hospital and recommended venue below to increase strength, balance, endurance for safe ADLs and gait.    Follow Up Recommendations SNF;Supervision/Assistance - 24 hour    Equipment Recommendations  None recommended by PT    Recommendations for Other Services       Precautions / Restrictions Precautions Precautions: Fall Restrictions Weight Bearing Restrictions: No      Mobility  Bed Mobility Overal bed mobility: Needs Assistance Bed Mobility: Supine to Sit;Sit to Supine     Supine to sit: Min guard Sit to supine: Min guard      Transfers Overall transfer level: Needs assistance Equipment used: Rolling walker (2 wheeled) Transfers: Sit to/from [facility] Sit to Stand: Min assist;Mod assist Stand pivot transfers: Min  assist;Mod assist       General transfer comment: unsteady on feet, requires Mod assist with not using AD  Ambulation/Gait Ambulation/Gait assistance: Min assist;Mod assist Gait Distance (Feet): 45 Feet Assistive device: Rolling walker (2 wheeled) Gait Pattern/deviations: Decreased step length - right;Decreased step length - left;Decreased stride length Gait velocity: slow   General Gait Details: slow unsteady cadence with difficulty making turns, VC's to step closer to RW, limited secondary to fatigue, poor standing balance  Stairs            Wheelchair Mobility    Modified Rankin (Stroke Patients Only)       Balance Overall balance assessment: Needs assistance Sitting-balance support: Feet supported;No upper extremity supported Sitting balance-Leahy Scale: Good     Standing balance support: No upper extremity supported;During functional activity Standing balance-Leahy Scale: Poor Standing balance comment: fair using RW                             Pertinent Vitals/Pain Pain Assessment: No/denies pain    Home Living Family/patient expects to be discharged to:: Private residence Living Arrangements: Alone Available Help at Discharge: Family;Personal care attendant Type of Home: Apartment Home Access: Level entry     Home Layout: One level Home Equipment: Walker - 2 wheels;Cane - single point;Shower seat      Prior Function Level of Independence: Needs assistance   Gait / Transfers Assistance Needed: household ambulator with [facility]  ADL's / Homemaking Assistance Needed: home aides 3-4 hours/daily x 5 days a week, patient normally takes care of himself over  the weekends        Hand Dominance        Extremity/Trunk Assessment   Upper Extremity Assessment Upper Extremity Assessment: Generalized weakness    Lower Extremity Assessment Lower Extremity Assessment: Generalized weakness    Cervical / Trunk Assessment Cervical / Trunk  Assessment: Kyphotic  Communication   Communication: No difficulties  Cognition Arousal/Alertness: Awake/alert Behavior During Therapy: WFL for tasks assessed/performed Overall Cognitive Status: Impaired/Different from baseline Area of Impairment: Orientation;Safety/judgement                 Orientation Level: Person       Safety/Judgement: Decreased awareness of safety     General Comments: Patient not at baseline mentation per patient's neice      General Comments      Exercises     Assessment/Plan    PT Assessment Patient needs continued PT services  PT Problem List Decreased strength;Decreased activity tolerance;Decreased balance;Decreased mobility       PT Treatment Interventions Gait training;Stair training;Functional mobility training;Therapeutic activities;Therapeutic exercise;Patient/family education    PT Goals (Current goals can be found in the Care Plan section)  Acute Rehab PT Goals Patient Stated Goal: return home  PT Goal Formulation: With patient/family Time For Goal Achievement: 08/29/17 Potential to Achieve Goals: Good    Frequency Min 3X/week   Barriers to discharge        Co-evaluation               AM-PAC PT "6 Clicks" Daily Activity  Outcome Measure Difficulty turning over in bed (including adjusting bedclothes, sheets and blankets)?: A Little Difficulty moving from lying on back to sitting on the side of the bed? : A Little Difficulty sitting down on and standing up from a chair with arms (e.g., wheelchair, bedside commode, etc,.)?: A Lot Help needed moving to and from a bed to chair (including a wheelchair)?: A Lot Help needed walking in hospital room?: A Lot Help needed climbing 3-5 steps with a railing? : A Lot 6 Click Score: 14    End of Session Equipment Utilized During Treatment: Gait belt Activity Tolerance: Patient tolerated treatment well;Patient limited by fatigue Patient left: in bed;with bed alarm set;with  call bell/phone within reach;with family/visitor present Nurse Communication: Mobility status PT Visit Diagnosis: Unsteadiness on feet (R26.81);Other abnormalities of gait and mobility (R26.89);Muscle weakness (generalized) (M62.81)    Time: 8938-1017 PT Time Calculation (min) (ACUTE ONLY): 24 min   Charges:   PT Evaluation $PT Eval Moderate Complexity: 1 Mod PT Treatments $Therapeutic Activity: 23-37 mins        12:14 PM, 08/15/17 Lonell Grandchild, MPT Physical Therapist with [facility] [phone] office [phone] mobile phone w

## 2017-08-15 NOTE — Progress Notes (Signed)
Initial Nutrition Assessment  DOCUMENTATION CODES:  Severe malnutrition in context of acute illness/injury  INTERVENTION:  Increase Ensure Enlive to TID.   Noted pt's reported food preferences. Will order  NUTRITION DIAGNOSIS:  Severe Malnutrition(Acute context) related to acute illness, poor appetite as evidenced by moderate muscle depletion and apparent weight loss of >5% bw x 1 month  GOAL:  Patient will meet greater than or equal to 90% of their needs  MONITOR:  PO intake, Supplement acceptance, Diet advancement, Labs, Weight trends, Skin, I & O's  REASON FOR ASSESSMENT:  Malnutrition Screening Tool    ASSESSMENT:  84 y/o male PMHx COPD, cirrhosis(?), HOH, GERD, HTN. Patient brought to ED due to increase in confusion. Family also endorsed increased weakness and decrease in intake. Work up for mild renal injury r/t dehydration. Admitted for further workup.   Pt confused. In safety mits with sitter. He does not know where he is.   All information obtained from niece. She says that, at baseline, the patient does live independently, but has had an aide that comes to check on him each day of the week. She personally sees him once per week. She says he was at his baseline up until 2 days ago-"started going downhill Wednesday"  She has never heard of a cirrhosis dx or any hx of etoh abuse. RD had noted in chart that pt had Barium Swallow done 05/28/16 that showed gross laryngeal penetration and aspiration. Niece says she has not noticed any s/s aspiration. His admission cxr was negative for PNA. She notes the patient takes pain medication at baseline for LLE pain, but does not struggle with constipation. No report of n/v/d.  Niece reports the patient has not been eating much recently. Normally, he eats best at breakfast (typically oatmeal w/ ensure). For lunch, he receives meals on wheels. At dinner, he will prepare something quick in the microwave, such as a biscuit or chicken strips.  She does not know if he takes a mvi at baseline.  Weight wise, she does not know his UBW, but says that at his last PCP visit with Dr. [place] Crane, he was told he had GAINED weight since prior visit. Per chart, he was listed as 172 lbs at that time and was 167 lbs in April of 2018.  Pt admitted at 158 lbs. A significant loss x1 month.   Physical Exam: Displays upper body muscle wasting. Abdomen soft. Lower body msucle intact-niece notes patient has been functional with walker until Wednesday  Consistent w/ pts reported baseline behavior. he ate 100% of Breakfast, but didn't do as well for Lunch. Will continue Ensure and increase to TID while on FL diet. RD noted the patients preferred food items and will place in system.   Labs: BUN/Creat:35/1.68 -> 27/1.27, Albumin: 3.0, Glucose: 99-129 Meds: Ensure BID, KCL, Vitamin D, Oscal w/ D, IVF   Recent Labs  Lab 08/14/17 1158 08/15/17 0133  NA 138 141  K 4.4 3.9  CL 104 110  CO2 26 23  BUN 35* 27*  CREATININE 1.68* 1.27*  CALCIUM 9.9 9.2  GLUCOSE 129* 98   NUTRITION - FOCUSED PHYSICAL EXAM:   Most Recent Value  Orbital Region  No depletion  Upper Arm Region  No depletion  Thoracic and Lumbar Region  No depletion  Buccal Region  No depletion  Temple Region  Moderate depletion  Clavicle Bone Region  Moderate depletion  Clavicle and Acromion Bone Region  Moderate depletion  Scapular Bone Region  Unable to assess  Dorsal Hand  Unable to assess  Patellar Region  No depletion  Anterior Thigh Region  No depletion  Posterior Calf Region  No depletion  Edema (RD Assessment)  None  Hair  Reviewed  Eyes  Reviewed  Mouth  Reviewed  Skin  Reviewed  Nails  Reviewed     Diet Order:   Diet Order            Diet full liquid Room service appropriate? Yes; Fluid consistency: Thin  Diet effective now             EDUCATION NEEDS:  No education needs have been identified at this time  Skin:  Skin Assessment: Reviewed RN Assessment  Last BM:   Unknown  Height:  Ht Readings from Last 1 Encounters:  08/14/17 5\' 8"  (1.727 m)   Weight:  Wt Readings from Last 1 Encounters:  08/14/17 71.8 kg   Wt Readings from Last 10 Encounters:  08/14/17 71.8 kg  07/08/17 78 kg  12/23/16 77.8 kg  09/24/16 78 kg  04/25/16 75.6 kg  11/26/15 81.6 kg  02/26/15 71.1 kg  10/24/14 76.2 kg  01/18/14 77.8 kg  06/02/13 78 kg   Ideal Body Weight:  70 kg  BMI:  Body mass index is 24.07 kg/m.  Estimated Nutritional Needs:  Kcal:  1700-1850 kcals (24-26 kcal/kg bw) Protein:  79-93g Pro (1.1-1.3g/kg bw) Fluid:  >1.8 L fluid (17ml/kcal)  Burtis Junes RD, LDN, CNSC Clinical Nutrition Available Tues-Sat via Pager: [phone] 08/15/2017 3:06 PM

## 2017-08-16 DIAGNOSIS — E43 Unspecified severe protein-calorie malnutrition: Secondary | ICD-10-CM

## 2017-08-16 LAB — BASIC METABOLIC PANEL
Anion gap: 8 (ref 5–15)
BUN: 21 mg/dL (ref 8–23)
CHLORIDE: 111 mmol/L (ref 98–111)
CO2: 22 mmol/L (ref 22–32)
Calcium: 9.3 mg/dL (ref 8.9–10.3)
Creatinine, Ser: 1.14 mg/dL (ref 0.61–1.24)
GFR calc Af Amer: >60 mL/min (ref >60)
GFR, EST NON AFRICAN AMERICAN: 57 mL/min — AB (ref >60)
GLUCOSE: 119 mg/dL — AB (ref 70–99)
POTASSIUM: 4.3 mmol/L (ref 3.5–5.1)
Sodium: 141 mmol/L (ref 135–145)

## 2017-08-16 MED ORDER — HYDRALAZINE HCL 20 MG/ML IJ SOLN
10.0000 mg | Freq: Once | INTRAMUSCULAR | Status: AC
  Administered 2017-08-16: 10 mg via INTRAVENOUS
  Filled 2017-08-16: qty 1

## 2017-08-16 MED ORDER — ALBUTEROL SULFATE (2.5 MG/3ML) 0.083% IN NEBU: 2.5000 mg | INHALATION_SOLUTION | Freq: Four times a day (QID) | RESPIRATORY_TRACT | Status: DC | PRN

## 2017-08-16 NOTE — Progress Notes (Signed)
PROGRESS NOTE    Kurt Burke  WUX:[id number] DOB: November 29, 1929 DOA: 08/14/2017 PCP: Celene Squibb, MD    Brief Narrative:  84 year old male with a history of COPD, hypertension, brought to the hospital with increasing confusion.  Found to have dehydration and acute kidney injury.  Started on IV fluids.  Remains confused at this time.  Work-up has been unrevealing.  Unable to tolerate MRI.  Will discuss with family regarding his baseline mental status.  Seen by physical therapy who recommended skilled nursing facility placement.   Assessment & Plan:   Principal Problem:   Altered mental status Active Problems:   Cirrhosis (HCC)   AKI (acute kidney injury) ([place])   Dehydration   Protein-calorie malnutrition, severe   1. Altered mental status.  Patient remains confused.  I had an extensive discussion with the patient's niece who is his main caregiver.  She reports that at baseline, he is able to carry on a conversation and knows where he is.  She also reports that he no longer drives, pays his bills, and at times gets confused when new people come to the house.  I suspect that he does have some degree of underlying dementia.  It appears that his symptoms have more recently worsened.  Thus far work-up has been unrevealing.  He does not have any signs of infection.  Metabolic work-up has been unrevealing other than dehydration which has since improved with fluids.  He cannot undergo MRI due to severe agitation.  He did receive benzodiazepines prior to scan, but did not tolerate being in the scanner.  We will check B12. 2. Acute kidney injury. Improving with IV fluids.  Continue current treatments.  Urinalysis is unremarkable. 3. Hypertension.  Blood pressure currently stable.  Continue on metoprolol. 4. COPD.  ABG did not show any significant CO2 retention.  No shortness of breath or wheezing.  Continue on Anoro Ellipta. 5. Generalized weakness.  Seen by physical therapy recommended skilled  nursing facility placement.   DVT prophylaxis: Lovenox Code Status: Full code Family Communication: Discussed with niece over the phone Disposition Plan: Needs skilled nursing facility placement on discharge   Consultants:     Procedures:     Antimicrobials:       Subjective: Opens his eyes to voice, but does not answer any questions  Objective: Vitals:   08/15/17 2124 08/16/17 0553 08/16/17 1346 08/16/17 1412  BP: (!) 132/93 133/78 (!) 141/98   Pulse: 89 88 90   Resp: 18  18   Temp: 97.7 F (36.5 C) 97.9 F (36.6 C) 98.3 F (36.8 C)   TempSrc: Oral Oral Oral   SpO2: 99% 98% 90% 100%  Weight:  77.2 kg    Height:        Intake/Output Summary (Last 24 hours) at 08/16/2017 1726 Last data filed at 08/16/2017 1347 Gross per 24 hour  Intake 2826.67 ml  Output 300 ml  Net 2526.67 ml   Filed Weights   08/14/17 1053 08/14/17 2012 08/16/17 0553  Weight: 78 kg 71.8 kg 77.2 kg    Examination:  General exam: Opens eyes to voice, does not appear to be in any distress Respiratory system: Clear to auscultation. Respiratory effort normal. Cardiovascular system:RRR. No murmurs, rubs, gallops. Gastrointestinal system: Abdomen is nondistended, soft and nontender. No organomegaly or masses felt. Normal bowel sounds heard. Central nervous system: No focal neurological deficits. Extremities: No C/C/E Skin: No rashes, lesions or ulcers Psychiatry: Confused, somnolent.    Data Reviewed: I have  personally reviewed following labs and imaging studies  CBC: Recent Labs  Lab 08/14/17 1158 08/15/17 0133  WBC 13.8* 8.3  HGB 11.7* 11.0*  HCT 38.4* 35.6*  MCV 93.7 93.2  PLT 262 466   Basic Metabolic Panel: Recent Labs  Lab 08/14/17 1158 08/15/17 0133 08/16/17 0631  NA 138 141 141  K 4.4 3.9 4.3  CL 104 110 111  CO2 26 23 22   GLUCOSE 129* 98 119*  BUN 35* 27* 21  CREATININE 1.68* 1.27* 1.14  CALCIUM 9.9 9.2 9.3   GFR: Estimated Creatinine Clearance: 45.8  mL/min (by C-G formula based on SCr of 1.14 mg/dL). Liver Function Tests: Recent Labs  Lab 08/14/17 1657  AST 31  ALT 27  ALKPHOS 48  BILITOT 1.0  PROT 6.8  ALBUMIN 3.0*   No results for input(s): LIPASE, AMYLASE in the last 168 hours. Recent Labs  Lab 08/14/17 1857  AMMONIA 24   Coagulation Profile: No results for input(s): INR, PROTIME in the last 168 hours. Cardiac Enzymes: Recent Labs  Lab 08/14/17 1657 08/14/17 1857 08/15/17 0133 08/15/17 0811  TROPONINI 0.04* 0.04* 0.04* 0.04*   BNP (last 3 results) No results for input(s): PROBNP in the last 8760 hours. HbA1C: No results for input(s): HGBA1C in the last 72 hours. CBG: No results for input(s): GLUCAP in the last 168 hours. Lipid Profile: No results for input(s): CHOL, HDL, LDLCALC, TRIG, CHOLHDL, LDLDIRECT in the last 72 hours. Thyroid Function Tests: No results for input(s): TSH, T4TOTAL, FREET4, T3FREE, THYROIDAB in the last 72 hours. Anemia Panel: No results for input(s): VITAMINB12, FOLATE, FERRITIN, TIBC, IRON, RETICCTPCT in the last 72 hours. Sepsis Labs: No results for input(s): PROCALCITON, LATICACIDVEN in the last 168 hours.  No results found for this or any previous visit (from the past 240 hour(s)).       Radiology Studies: No results found.      Scheduled Meds: . calcium-vitamin D  1 tablet Oral Daily  . cholecalciferol  1,000 Units Oral Daily  . enoxaparin (LOVENOX) injection  40 mg Subcutaneous Q24H  . ezetimibe  10 mg Oral QHS  . feeding supplement (ENSURE ENLIVE)  237 mL Oral TID BM  . metoprolol succinate  50 mg Oral Daily  . potassium chloride SA  20 mEq Oral Daily  . umeclidinium-vilanterol  1 puff Inhalation Daily   Continuous Infusions: . sodium chloride 100 mL/hr at 08/16/17 1147     LOS: 0 days    Time spent: 45mins    Kathie Dike, MD [facility] Pager [phone]  If 7PM-7AM, please contact night-coverage [email or web address] Password  [facility] 08/16/2017, 5:26 PM

## 2017-08-17 LAB — VITAMIN B12: Vitamin B-12: 250 pg/mL (ref 180–914)

## 2017-08-17 MED ORDER — TAMSULOSIN HCL 0.4 MG PO CAPS
0.4000 mg | ORAL_CAPSULE | Freq: Every day | ORAL | Status: DC
  Administered 2017-08-17 – 2017-08-18 (×2): 0.4 mg via ORAL
  Filled 2017-08-17 (×2): qty 1

## 2017-08-17 MED ORDER — METOPROLOL SUCCINATE ER 25 MG PO TB24
37.5000 mg | ORAL_TABLET | Freq: Two times a day (BID) | ORAL | Status: DC
  Administered 2017-08-17 – 2017-08-19 (×4): 37.5 mg via ORAL
  Filled 2017-08-17 (×4): qty 2

## 2017-08-17 MED ORDER — CYANOCOBALAMIN 1000 MCG/ML IJ SOLN
1000.0000 ug | Freq: Once | INTRAMUSCULAR | Status: AC
  Administered 2017-08-17: 1000 ug via INTRAMUSCULAR
  Filled 2017-08-17: qty 1

## 2017-08-17 NOTE — Progress Notes (Signed)
Patient having a hard time voiding.  Bladder scan read 1000 cc.  On-call MD notified via text page.  New order: urethral catheter for acute retention received.  14 french foley cath placed using sterile technique.  800 cc clear yellow urine returned upon placement.  Patient states that he feels better.  Will continue to monitor.

## 2017-08-17 NOTE — Progress Notes (Signed)
PROGRESS NOTE    Kurt Burke  GMW:[id number] DOB: 1929/11/25 DOA: 08/14/2017 PCP: Celene Squibb, MD    Brief Narrative:  84 year old male with a history of COPD, hypertension, brought to the hospital with increasing confusion.  Found to have dehydration and acute kidney injury.  Started on IV fluids with improvement of renal function.  Work-up has been unrevealing.  Unable to tolerate MRI.  After discussing with family, I suspect that he has some degree of dementia at baseline.  Overall mental status slowly improving.  Seen by physical therapy who felt that he was not safe to return home alone and recommended skilled nursing facility placement.   Assessment & Plan:   Principal Problem:   Altered mental status Active Problems:   Cirrhosis (HCC)   AKI (acute kidney injury) ([place])   Dehydration   Protein-calorie malnutrition, severe   1. Altered mental status.  After an extensive discussion with the patient's niece who is his main caregiver, she reported that at baseline, he is able to carry on a conversation and knows where he is.  She also reported that he no longer drives, pays his bills, and at times gets confused when new people come to the house.  I suspect that he does have some degree of underlying dementia.  It appears that his symptoms have more recently worsened.  Thus far work-up has been unrevealing.  He does not have any signs of infection.  Metabolic work-up has been unrevealing other than dehydration which has since improved with fluids.  He cannot undergo MRI due to severe agitation.  He did receive benzodiazepines prior to scan, but did not tolerate being in the scanner.  B12 checked and is at lower end of normal.  Will give a replacement dose.  Overall, since admission his mental status does appear to be improving.  Agitation appears to be resolving and he appears more coherent today. 2. Acute kidney injury. Improving with IV fluids.  Continue current treatments.  Urinalysis  is unremarkable. 3. Hypertension.  Blood pressure currently stable.  Continue on metoprolol.  He is having episodes of tachycardia.  Will change Toprol from 50mg  daily to 37.5mg  twice daily. 4. COPD.  ABG did not show any significant CO2 retention.  No shortness of breath or wheezing.  Continue on Anoro Ellipta. 5. Generalized weakness.  Seen by physical therapy recommended skilled nursing facility placement. 6. Urinary retention.  Patient had difficulty passing urine.  Foley catheter was placed with expulsion of 800 cc of urine.  Started on Flomax.   DVT prophylaxis: Lovenox Code Status: Full code Family Communication: Discussed with niece at the bedside Disposition Plan: Needs skilled nursing facility placement on discharge   Consultants:     Procedures:     Antimicrobials:       Subjective: He denies any pain or shortness of breath.  He said his knees feels that he is improving today.  He is sitting up, engaging in conversation more, ate some breakfast.  Objective: Vitals:   08/16/17 2333 08/17/17 0500 08/17/17 0543 08/17/17 1313  BP: (!) 139/96  125/90 119/68  Pulse: (!) 120  (!) 110 (!) 113  Resp:   18 18  Temp:   98.5 F (36.9 C) 98.6 F (37 C)  TempSrc:   Oral   SpO2:   93% 98%  Weight:  77.1 kg    Height:        Intake/Output Summary (Last 24 hours) at 08/17/2017 1550 Last data filed at 08/17/2017  1541 Gross per 24 hour  Intake 2848.34 ml  Output 2250 ml  Net 598.34 ml   Filed Weights   08/14/17 2012 08/16/17 0553 08/17/17 0500  Weight: 71.8 kg 77.2 kg 77.1 kg    Examination:  General exam: sitting up in bed, no distress Respiratory system: Clear to auscultation. Respiratory effort normal. Cardiovascular system:tachycardic. No murmurs, rubs, gallops. Gastrointestinal system: Abdomen is nondistended, soft and nontender. No organomegaly or masses felt. Normal bowel sounds heard. Central nervous system: No focal neurological deficits. Extremities: No  C/C/E Skin: No rashes, lesions or ulcers Psychiatry: Confused, awake, cooperates with exam   Data Reviewed: I have personally reviewed following labs and imaging studies  CBC: Recent Labs  Lab 08/14/17 1158 08/15/17 0133  WBC 13.8* 8.3  HGB 11.7* 11.0*  HCT 38.4* 35.6*  MCV 93.7 93.2  PLT 262 825   Basic Metabolic Panel: Recent Labs  Lab 08/14/17 1158 08/15/17 0133 08/16/17 0631  NA 138 141 141  K 4.4 3.9 4.3  CL 104 110 111  CO2 26 23 22   GLUCOSE 129* 98 119*  BUN 35* 27* 21  CREATININE 1.68* 1.27* 1.14  CALCIUM 9.9 9.2 9.3   GFR: Estimated Creatinine Clearance: 45.8 mL/min (by C-G formula based on SCr of 1.14 mg/dL). Liver Function Tests: Recent Labs  Lab 08/14/17 1657  AST 31  ALT 27  ALKPHOS 48  BILITOT 1.0  PROT 6.8  ALBUMIN 3.0*   No results for input(s): LIPASE, AMYLASE in the last 168 hours. Recent Labs  Lab 08/14/17 1857  AMMONIA 24   Coagulation Profile: No results for input(s): INR, PROTIME in the last 168 hours. Cardiac Enzymes: Recent Labs  Lab 08/14/17 1657 08/14/17 1857 08/15/17 0133 08/15/17 0811  TROPONINI 0.04* 0.04* 0.04* 0.04*   BNP (last 3 results) No results for input(s): PROBNP in the last 8760 hours. HbA1C: No results for input(s): HGBA1C in the last 72 hours. CBG: No results for input(s): GLUCAP in the last 168 hours. Lipid Profile: No results for input(s): CHOL, HDL, LDLCALC, TRIG, CHOLHDL, LDLDIRECT in the last 72 hours. Thyroid Function Tests: No results for input(s): TSH, T4TOTAL, FREET4, T3FREE, THYROIDAB in the last 72 hours. Anemia Panel: Recent Labs    08/17/17 0551  VITAMINB12 250   Sepsis Labs: No results for input(s): PROCALCITON, LATICACIDVEN in the last 168 hours.  No results found for this or any previous visit (from the past 240 hour(s)).       Radiology Studies: No results found.      Scheduled Meds: . calcium-vitamin D  1 tablet Oral Daily  . cholecalciferol  1,000 Units Oral  Daily  . enoxaparin (LOVENOX) injection  40 mg Subcutaneous Q24H  . ezetimibe  10 mg Oral QHS  . feeding supplement (ENSURE ENLIVE)  237 mL Oral TID BM  . metoprolol succinate  37.5 mg Oral BID  . potassium chloride SA  20 mEq Oral Daily  . tamsulosin  0.4 mg Oral QPC supper  . umeclidinium-vilanterol  1 puff Inhalation Daily   Continuous Infusions: . sodium chloride 100 mL/hr at 08/17/17 0748     LOS: 0 days    Time spent: 102mins    Kathie Dike, MD [facility] Pager [phone]  If 7PM-7AM, please contact night-coverage [email or web address] Password [email or web address] 08/17/2017, 3:50 PM

## 2017-08-18 ENCOUNTER — Observation Stay (HOSPITAL_COMMUNITY): Payer: Medicare Other

## 2017-08-18 ENCOUNTER — Encounter (HOSPITAL_COMMUNITY): Payer: Self-pay | Admitting: Primary Care

## 2017-08-18 DIAGNOSIS — I824Z2 Acute embolism and thrombosis of unspecified deep veins of left distal lower extremity: Secondary | ICD-10-CM | POA: Diagnosis present

## 2017-08-18 DIAGNOSIS — R1312 Dysphagia, oropharyngeal phase: Secondary | ICD-10-CM | POA: Diagnosis not present

## 2017-08-18 DIAGNOSIS — Z7401 Bed confinement status: Secondary | ICD-10-CM | POA: Diagnosis not present

## 2017-08-18 DIAGNOSIS — Z8249 Family history of ischemic heart disease and other diseases of the circulatory system: Secondary | ICD-10-CM | POA: Diagnosis not present

## 2017-08-18 DIAGNOSIS — R339 Retention of urine, unspecified: Secondary | ICD-10-CM | POA: Diagnosis not present

## 2017-08-18 DIAGNOSIS — Z515 Encounter for palliative care: Secondary | ICD-10-CM | POA: Diagnosis not present

## 2017-08-18 DIAGNOSIS — M1990 Unspecified osteoarthritis, unspecified site: Secondary | ICD-10-CM | POA: Diagnosis present

## 2017-08-18 DIAGNOSIS — Z7189 Other specified counseling: Secondary | ICD-10-CM

## 2017-08-18 DIAGNOSIS — R4182 Altered mental status, unspecified: Secondary | ICD-10-CM

## 2017-08-18 DIAGNOSIS — R5381 Other malaise: Secondary | ICD-10-CM | POA: Diagnosis not present

## 2017-08-18 DIAGNOSIS — Z885 Allergy status to narcotic agent status: Secondary | ICD-10-CM | POA: Diagnosis not present

## 2017-08-18 DIAGNOSIS — N179 Acute kidney failure, unspecified: Secondary | ICD-10-CM | POA: Diagnosis not present

## 2017-08-18 DIAGNOSIS — R55 Syncope and collapse: Secondary | ICD-10-CM | POA: Diagnosis present

## 2017-08-18 DIAGNOSIS — H9190 Unspecified hearing loss, unspecified ear: Secondary | ICD-10-CM | POA: Diagnosis present

## 2017-08-18 DIAGNOSIS — E860 Dehydration: Secondary | ICD-10-CM | POA: Diagnosis not present

## 2017-08-18 DIAGNOSIS — M6281 Muscle weakness (generalized): Secondary | ICD-10-CM | POA: Diagnosis not present

## 2017-08-18 DIAGNOSIS — F0390 Unspecified dementia without behavioral disturbance: Secondary | ICD-10-CM | POA: Diagnosis present

## 2017-08-18 DIAGNOSIS — Z6824 Body mass index (BMI) 24.0-24.9, adult: Secondary | ICD-10-CM | POA: Diagnosis not present

## 2017-08-18 DIAGNOSIS — K219 Gastro-esophageal reflux disease without esophagitis: Secondary | ICD-10-CM | POA: Diagnosis present

## 2017-08-18 DIAGNOSIS — Z87891 Personal history of nicotine dependence: Secondary | ICD-10-CM | POA: Diagnosis not present

## 2017-08-18 DIAGNOSIS — K7460 Unspecified cirrhosis of liver: Secondary | ICD-10-CM | POA: Diagnosis not present

## 2017-08-18 DIAGNOSIS — E43 Unspecified severe protein-calorie malnutrition: Secondary | ICD-10-CM | POA: Diagnosis not present

## 2017-08-18 DIAGNOSIS — J449 Chronic obstructive pulmonary disease, unspecified: Secondary | ICD-10-CM | POA: Diagnosis not present

## 2017-08-18 DIAGNOSIS — I82412 Acute embolism and thrombosis of left femoral vein: Secondary | ICD-10-CM | POA: Diagnosis not present

## 2017-08-18 DIAGNOSIS — I82502 Chronic embolism and thrombosis of unspecified deep veins of left lower extremity: Secondary | ICD-10-CM | POA: Diagnosis not present

## 2017-08-18 DIAGNOSIS — Z886 Allergy status to analgesic agent status: Secondary | ICD-10-CM | POA: Diagnosis not present

## 2017-08-18 DIAGNOSIS — I10 Essential (primary) hypertension: Secondary | ICD-10-CM | POA: Diagnosis not present

## 2017-08-18 DIAGNOSIS — R600 Localized edema: Secondary | ICD-10-CM | POA: Diagnosis not present

## 2017-08-18 DIAGNOSIS — I82432 Acute embolism and thrombosis of left popliteal vein: Secondary | ICD-10-CM | POA: Diagnosis present

## 2017-08-18 MED ORDER — HEPARIN (PORCINE) IN NACL 100-0.45 UNIT/ML-% IJ SOLN
1200.0000 [IU]/h | INTRAMUSCULAR | Status: DC
  Administered 2017-08-18 – 2017-08-19 (×2): 1200 [IU]/h via INTRAVENOUS
  Filled 2017-08-18 (×2): qty 250

## 2017-08-18 MED ORDER — HEPARIN BOLUS VIA INFUSION
4000.0000 [IU] | Freq: Once | INTRAVENOUS | Status: AC
  Administered 2017-08-18: 4000 [IU] via INTRAVENOUS
  Filled 2017-08-18: qty 4000

## 2017-08-18 NOTE — NC FL2 (Signed)
[place] LEVEL OF CARE SCREENING TOOL     IDENTIFICATION  Patient Name: Kurt Burke Birthdate: 1929/04/23 Sex: male Admission Date (Current Location): 08/14/2017  [place] and [place] Number:  Selena Lesser [id number] [place] and Address:  [facility], [address] [place], [place] [address]      Provider Number: [id number]  Attending Physician Name and Address:  Rodena Goldmann, DO  Relative Name and Phone Number:  Adele Barthel Niece [phone] or Inda Castle [phone] or Zara Chess    [phone]     Current Level of Care: Hospital Recommended Level of Care: [place] Prior Approval Number:    Date Approved/Denied:   PASRR Number: [id number]  Discharge Plan: SNF    Current Diagnoses: Patient Active Problem List   Diagnosis Date Noted  . Protein-calorie malnutrition, severe 08/16/2017  . COPD (chronic obstructive pulmonary disease) ([place])   . SOB (shortness of breath) 02/26/2015  . AKI (acute kidney injury) ([place]) 02/26/2015  . FTT (failure to thrive) in adult 02/26/2015  . Weakness 02/26/2015  . Dehydration 02/26/2015  . Altered mental status 06/16/2012  . URI (upper respiratory infection) 06/16/2012  . Fever 06/16/2012  . Rectal bleeding 09/26/2011  . Rotator cuff insufficiency 09/23/2011  . GERD (gastroesophageal reflux disease) 10/23/2010  . Cirrhosis ([place]) 10/23/2010  . SHOULDER PAIN 10/17/2008  . IMPINGEMENT SYNDROME 10/17/2008  . RUPTURE ROTATOR CUFF 10/17/2008    Orientation RESPIRATION BLADDER Height & Weight     Self  Normal Continent Weight: 170 lb 13.7 oz (77.5 kg) Height:  5\' 8"  (172.7 cm)  BEHAVIORAL SYMPTOMS/MOOD NEUROLOGICAL BOWEL NUTRITION STATUS      Continent Diet(Full Liquid diet)  AMBULATORY STATUS COMMUNICATION OF NEEDS Skin   Extensive Assist Verbally Normal                       Personal Care Assistance Level of Assistance  Feeding, Dressing,  Bathing Bathing Assistance: Limited assistance Feeding assistance: Limited assistance Dressing Assistance: Limited assistance     Functional Limitations Info  Hearing, Speech, Sight Sight Info: Adequate Hearing Info: Impaired(HOH) Speech Info: Adequate    SPECIAL CARE FACTORS FREQUENCY  PT (By licensed PT)     PT Frequency: 5x a week              Contractures Contractures Info: Not present    Additional Factors Info  Code Status, Allergies Code Status Info: Full Code Allergies Info: ACETAMINOPHEN, CODEINE, IBUPROFEN            Current Medications (08/18/2017):  This is the current hospital active medication list Current Facility-Administered Medications  Medication Dose Route Frequency Provider Last Rate Last Dose  . 0.9 %  sodium chloride infusion   Intravenous Continuous Phillips Grout, MD 100 mL/hr at 08/18/17 0333    . acetaminophen (TYLENOL) tablet 650 mg  650 mg Oral Q6H PRN Kathie Dike, MD   650 mg at 08/17/17 1219  . albuterol (PROVENTIL) (2.5 MG/3ML) 0.083% nebulizer solution 2.5 mg  2.5 mg Nebulization Q6H PRN Kathie Dike, MD      . calcium-vitamin D (OSCAL WITH D) 500-200 MG-UNIT per tablet 1 tablet  1 tablet Oral Daily Phillips Grout, MD   1 tablet at 08/18/17 4580  . carbamide peroxide (DEBROX) 6.5 % OTIC (EAR) solution 5 drop  5 drop Both EARS BID PRN Phillips Grout, MD      . cholecalciferol (VITAMIN D) tablet 1,000 Units  1,000 Units Oral Daily  Phillips Grout, MD   1,000 Units at 08/18/17 [phone]  . enoxaparin (LOVENOX) injection 40 mg  40 mg Subcutaneous Q24H Kathie Dike, MD   40 mg at 08/17/17 2255  . ezetimibe (ZETIA) tablet 10 mg  10 mg Oral QHS Derrill Kay A, MD   10 mg at 08/17/17 2254  . feeding supplement (ENSURE ENLIVE) (ENSURE ENLIVE) liquid 237 mL  237 mL Oral TID BM Kathie Dike, MD   237 mL at 08/18/17 0922  . meclizine (ANTIVERT) tablet 25 mg  25 mg Oral QID PRN Derrill Kay A, MD      . metoprolol succinate (TOPROL-XL) 24  hr tablet 37.5 mg  37.5 mg Oral BID Kathie Dike, MD   37.5 mg at 08/18/17 0922  . potassium chloride SA (K-DUR,KLOR-CON) CR tablet 20 mEq  20 mEq Oral Daily Derrill Kay A, MD   20 mEq at 08/18/17 8850  . tamsulosin (FLOMAX) capsule 0.4 mg  0.4 mg Oral QPC supper Kathie Dike, MD   0.4 mg at 08/17/17 1606  . umeclidinium-vilanterol (ANORO ELLIPTA) 62.5-25 MCG/INH 1 puff  1 puff Inhalation Daily Phillips Grout, MD   1 puff at 08/18/17 2774     Discharge Medications: Please see discharge summary for a list of discharge medications.  Relevant Imaging Results:  Relevant Lab Results:   Additional Information SSN [id number]  Ihor Gully, LCSW

## 2017-08-18 NOTE — Consult Note (Signed)
Consultation Note Date: 08/18/2017   Patient Name: Kurt Burke  DOB: 18-Apr-1929  MRN: [id number]  Age / Sex: 84 y.o., male  PCP: Celene Squibb, MD Referring Physician: Rodena Goldmann, DO  Reason for Consultation: Establishing goals of care  HPI/Patient Profile: 84 y.o. male  with past medical history of COPD, hypertension, arthritis, GERD, hearing loss admitted on 08/14/2017 with altered mental status with no signs of infection, some dehydration.   Clinical Assessment and Goals of Care: Kurt Burke is lying quietly in bed.  He wakes easily, greets me making and keeping eye contact.  He is calm and cooperative, not fearful.  He is oriented to self, but does not know that we are in the hospital.  He tells me that he is doing okay, declines to take food or drink.  There is no family at bedside at this time.  Per Education officer, museum note, pt sister states niece, Kurt Burke is healthcare power of attorney.  Call to [place] at [phone]. We set up a phone conference for tomorrow at 1130.   Conference with social worker related to plan of care for disposition.  Healthcare power of attorney HCPOA - Per social worker note, pt sister states niece, Kurt Burke is healthcare power of attorney.    SUMMARY OF RECOMMENDATIONS   SNF for rehab, likely residential.   Code Status/Advance Care Planning:  Full code -patient is unable to verify his desire due to dementia.   Symptom Management:   Per hospitalist, no additional needs at this time.  Palliative Prophylaxis:   Aspiration and Turn Reposition  Additional Recommendations (Limitations, Scope, Preferences):  Treat the treatable but no CPR, no intubation.  Psycho-social/Spiritual:   Desire for further Chaplaincy support:no  Additional Recommendations: Caregiving  Support/Resources and Education on Hospice  Prognosis:   Unable to determine based on  outcomes.  One year or less would not be surprising.  Discharge Planning: Requesting local rehab with likely transition to residential     Primary Diagnoses: Present on Admission: . Altered mental status . Cirrhosis ([place]) . AKI (acute kidney injury) ([place]) . Dehydration   I have reviewed the medical record, interviewed the patient and family, and examined the patient. The following aspects are pertinent.  Past Medical History:  Diagnosis Date  . Arthritis   . COPD (chronic obstructive pulmonary disease) ([place])   . GERD (gastroesophageal reflux disease)   . HOH (hard of hearing)   . Hypertension   . Shortness of breath    exertion   Social History   Socioeconomic History  . Marital status: Single    Spouse name: Not on file  . Number of children: Not on file  . Years of education: 5  . Highest education level: Not on file  Occupational History  . Not on file  Social Needs  . Financial resource strain: Not on file  . Food insecurity:    Worry: Not on file    Inability: Not on file  . Transportation needs:  Medical: Not on file    Non-medical: Not on file  Tobacco Use  . Smoking status: Former Smoker    Types: Cigarettes    Last attempt to quit: 10/22/1990    Years since quitting: 26.8  . Smokeless tobacco: Never Used  Substance and Sexual Activity  . Alcohol use: No    Alcohol/week: 0.0 standard drinks  . Drug use: No  . Sexual activity: Not on file  Lifestyle  . Physical activity:    Days per week: Not on file    Minutes per session: Not on file  . Stress: Not on file  Relationships  . Social connections:    Talks on phone: Not on file    Gets together: Not on file    Attends religious service: Not on file    Active member of club or organization: Not on file    Attends meetings of clubs or organizations: Not on file    Relationship status: Not on file  Other Topics Concern  . Not on file  Social History Narrative  . Not on file   Family  History  Problem Relation Age of Onset  . Heart disease Mother   . Heart disease Father   . Healthy Sister   . Arthritis Unknown    Scheduled Meds: . calcium-vitamin D  1 tablet Oral Daily  . cholecalciferol  1,000 Units Oral Daily  . enoxaparin (LOVENOX) injection  40 mg Subcutaneous Q24H  . ezetimibe  10 mg Oral QHS  . feeding supplement (ENSURE ENLIVE)  237 mL Oral TID BM  . metoprolol succinate  37.5 mg Oral BID  . potassium chloride SA  20 mEq Oral Daily  . tamsulosin  0.4 mg Oral QPC supper  . umeclidinium-vilanterol  1 puff Inhalation Daily   Continuous Infusions: . sodium chloride 100 mL/hr at 08/18/17 1509   PRN Meds:.acetaminophen, albuterol, carbamide peroxide, meclizine Medications Prior to Admission:  Prior to Admission medications   Medication Sig Start Date End Date Taking? Authorizing Provider  Calcium Carbonate-Vitamin D (CALCIUM 500 + D) 500-125 MG-UNIT TABS Take 1 tablet by mouth daily.   Yes [provider]  carbamide peroxide (DEBROX) 6.5 % otic solution Place 5 drops into both ears 2 (two) times daily as needed (ear wax removal).    Yes [provider]  cholecalciferol (VITAMIN D) 1000 units tablet Take 1,000 Units by mouth daily.   Yes [provider]  dexlansoprazole (DEXILANT) 60 MG capsule Take 60 mg by mouth daily.   Yes [provider]  furosemide (LASIX) 40 MG tablet Take 40 mg by mouth daily.   Yes [provider]  levalbuterol (XOPENEX) 0.63 MG/3ML nebulizer solution Take 3 mLs (0.63 mg total) by nebulization every 8 (eight) hours as needed for wheezing or shortness of breath. 02/27/15  Yes Kelvin Cellar, MD  metoprolol succinate (TOPROL-XL) 50 MG 24 hr tablet Take 50 mg by mouth daily. 09/12/11  Yes [provider]  Multiple Vitamin (MULTI-DAY VITAMINS PO) Take 1 tablet by mouth daily.   Yes [provider]  potassium chloride SA (K-DUR,KLOR-CON) 20 MEQ tablet Take 20 mEq by mouth daily.     Yes [provider]  umeclidinium-vilanterol (ANORO ELLIPTA) 62.5-25 MCG/INH AEPB Inhale 1 puff into the lungs daily.   Yes [provider]  ZETIA 10 MG tablet Take 10 mg by mouth daily. 08/26/11  Yes [provider]  meclizine (ANTIVERT) 25 MG tablet Take 25 mg by mouth 4 (four) times daily  as needed for dizziness.     [provider]   Allergies  Allergen Reactions  . Acetaminophen   . Codeine Other (See Comments)    Unknown reaction  . Ibuprofen     MD took patient off as he had a blood clot in his leg   Review of Systems  Unable to perform ROS: Dementia    Physical Exam  Constitutional: No distress.  Makes and keeps eye contact, calm and cooperative, appears chronically ill, frail  HENT:  Head: Atraumatic.  Cardiovascular: Normal rate.  Pulmonary/Chest: Effort normal. No respiratory distress.  Abdominal: Soft. He exhibits no distension. There is no guarding.  Musculoskeletal:  Mild edema bilateral lower extremity  Neurological: He is alert.  Known dementia  Skin: Skin is warm and dry.  Psychiatric:  Calm and cooperative, not fearful  Nursing note and vitals reviewed.   Vital Signs: BP (!) 156/98 (BP Location: Left Arm)   Pulse 99   Temp 98 F (36.7 C) (Oral)   Resp 18   Ht 5\' 8"  (1.727 m)   Wt 77.5 kg   SpO2 94%   BMI 25.98 kg/m  Pain Scale: 0-10   Pain Score: 0-No pain   SpO2: SpO2: 94 % O2 Device:SpO2: 94 % O2 Flow Rate: .   IO: Intake/output summary:   Intake/Output Summary (Last 24 hours) at 08/18/2017 1528 Last data filed at 08/18/2017 1000 Gross per 24 hour  Intake 2818.33 ml  Output 1225 ml  Net 1593.33 ml    LBM: Last BM Date: (patient unable to state) Baseline Weight: Weight: 78 kg Most recent weight: Weight: 77.5 kg     Palliative Assessment/Data:   Flowsheet Rows     Most Recent Value  Intake Tab  Referral Department  Hospitalist  Unit at Time of Referral  Cardiac/Telemetry Unit  Palliative Care  Primary Diagnosis  Other (Comment) [altered mental status]  Date Notified  08/18/17  Palliative Care Type  New Palliative care  Reason for referral  Clarify Goals of Care  Date of Admission  08/14/17  Date first seen by Palliative Care  08/18/17  # of days Palliative referral response time  0 Day(s)  # of days IP prior to Palliative referral  4  Clinical Assessment  Palliative Performance Scale Score  30%  Pain Max last 24 hours  Not able to report  Pain Min Last 24 hours  Not able to report  Dyspnea Max Last 24 Hours  Not able to report  Dyspnea Min Last 24 hours  Not able to report  Psychosocial & Spiritual Assessment  Palliative Care Outcomes  Patient/Family meeting held?  Yes  Who was at the meeting?  pt at bedside  [place]  Provided psychosocial or spiritual support  Patient/Family wishes: Interventions discontinued/not started   Mechanical Ventilation      Time In: 1510 Time Out: 1540 Time Total: 30 minutes Greater than 50%  of this time was spent counseling and coordinating care related to the above assessment and plan.  Signed by: Drue Novel, NP   Please contact Palliative Medicine Team phone at [phone] for questions and concerns.  For individual provider: See Shea Evans

## 2017-08-18 NOTE — Progress Notes (Signed)
PROGRESS NOTE    BRECK MARYLAND  YWV:[id number] DOB: 05-Sep-1929 DOA: 08/14/2017 PCP: Celene Squibb, MD   Brief Narrative:   84 year old male with a history of COPD, hypertension, brought to the hospital with increasing confusion.  Found to have dehydration and acute kidney injury.  Started on IV fluids with improvement of renal function.  Work-up has been unrevealing.  Unable to tolerate MRI.    Moderate to severe baseline dementia per discussion with family members earlier in the week.  He continues to have altered mentation with no significant improvement as well as potentially an ischemic limb to the left side for which further evaluation has been ordered.   Assessment & Plan:   Principal Problem:   Altered mental status Active Problems:   Cirrhosis (HCC)   AKI (acute kidney injury) ([facility] [place])   Dehydration   Protein-calorie malnutrition, severe   1. Altered mental status.  After an extensive discussion with the patient's niece who is his main caregiver, she reported that at baseline, he is able to carry on a conversation and knows where he is.  She also reported that he no longer drives, pays his bills, and at times gets confused when new people come to the house.  I suspect that he does have some degree of underlying dementia.  It appears that his symptoms have more recently worsened.  Thus far work-up has been unrevealing.  He does not have any signs of infection.  Metabolic work-up has been unrevealing other than dehydration which has since improved with fluids.  He cannot undergo MRI due to severe agitation.  He did receive benzodiazepines prior to scan, but did not tolerate being in the scanner.  B12 checked and is at lower end of normal.  Will give a replacement dose.  Overall, since admission his mental status does appear to be improving.  2. Suspected LLE ischemia. Further testing with venous and arterial U/S of lower extremities. 3. Acute kidney injury. Improving with IV fluids.   Continue current treatments.  Urinalysis is unremarkable. 4. Hypertension.  Blood pressure currently stable.  Continue on metoprolol.  He is having episodes of tachycardia.    Continue Toprol at 37.5 mg twice daily with improvement in tachycardia noted today. 5. COPD.  ABG did not show any significant CO2 retention.  No shortness of breath or wheezing.  Continue on Anoro Ellipta. 6. Generalized weakness.  Seen by physical therapy recommended skilled nursing facility placement. 7. Urinary retention.  Patient had difficulty passing urine.  Foley catheter was placed with expulsion of 800 cc of urine.  Started on Flomax.   DVT prophylaxis: Lovenox Code Status: Full code Family Communication: Discussed with niece at the bedside Disposition Plan: Needs skilled nursing facility placement on discharge; left lower extremity evaluation as well as palliative care evaluation.   Consultants:   Palliative care for goals of care discussion  Procedures:   None  Antimicrobials:    None  Subjective: Patient seen and evaluated today and denies any complaints or concerns.  He was noted by PT to have a cool left lower extremity that is more swollen as well.  Objective: Vitals:   08/17/17 2221 08/18/17 0500 08/18/17 0612 08/18/17 0824  BP: 115/77  (!) 149/96   Pulse: (!) 102  97   Resp: 18  18   Temp: 98.3 F (36.8 C)  98.4 F (36.9 C)   TempSrc: Oral  Oral   SpO2: 100%  98% 98%  Weight:  77.5 kg  Height:        Intake/Output Summary (Last 24 hours) at 08/18/2017 1248 Last data filed at 08/18/2017 1000 Gross per 24 hour  Intake 2818.33 ml  Output 1225 ml  Net 1593.33 ml   Filed Weights   08/16/17 0553 08/17/17 0500 08/18/17 0500  Weight: 77.2 kg 77.1 kg 77.5 kg    Examination:  General exam: Appears calm and comfortable  Respiratory system: Clear to auscultation. Respiratory effort normal. Cardiovascular system: S1 & S2 heard, RRR. No JVD, murmurs, rubs, gallops or clicks.  No pedal edema. Gastrointestinal system: Abdomen is nondistended, soft and nontender. No organomegaly or masses felt. Normal bowel sounds heard. Central nervous system:  No focal neurological deficits. Extremities: Symmetric 5 x 5 power. Skin: No rashes, lesions or ulcers Psychiatry: Confused, but awake.    Data Reviewed: I have personally reviewed following labs and imaging studies  CBC: Recent Labs  Lab 08/14/17 1158 08/15/17 0133  WBC 13.8* 8.3  HGB 11.7* 11.0*  HCT 38.4* 35.6*  MCV 93.7 93.2  PLT 262 426   Basic Metabolic Panel: Recent Labs  Lab 08/14/17 1158 08/15/17 0133 08/16/17 0631  NA 138 141 141  K 4.4 3.9 4.3  CL 104 110 111  CO2 26 23 22   GLUCOSE 129* 98 119*  BUN 35* 27* 21  CREATININE 1.68* 1.27* 1.14  CALCIUM 9.9 9.2 9.3   GFR: Estimated Creatinine Clearance: 45.8 mL/min (by C-G formula based on SCr of 1.14 mg/dL). Liver Function Tests: Recent Labs  Lab 08/14/17 1657  AST 31  ALT 27  ALKPHOS 48  BILITOT 1.0  PROT 6.8  ALBUMIN 3.0*   No results for input(s): LIPASE, AMYLASE in the last 168 hours. Recent Labs  Lab 08/14/17 1857  AMMONIA 24   Coagulation Profile: No results for input(s): INR, PROTIME in the last 168 hours. Cardiac Enzymes: Recent Labs  Lab 08/14/17 1657 08/14/17 1857 08/15/17 0133 08/15/17 0811  TROPONINI 0.04* 0.04* 0.04* 0.04*   BNP (last 3 results) No results for input(s): PROBNP in the last 8760 hours. HbA1C: No results for input(s): HGBA1C in the last 72 hours. CBG: No results for input(s): GLUCAP in the last 168 hours. Lipid Profile: No results for input(s): CHOL, HDL, LDLCALC, TRIG, CHOLHDL, LDLDIRECT in the last 72 hours. Thyroid Function Tests: No results for input(s): TSH, T4TOTAL, FREET4, T3FREE, THYROIDAB in the last 72 hours. Anemia Panel: Recent Labs    08/17/17 0551  VITAMINB12 250   Sepsis Labs: No results for input(s): PROCALCITON, LATICACIDVEN in the last 168 hours.  No results found  for this or any previous visit (from the past 240 hour(s)).       Radiology Studies: No results found.      Scheduled Meds: . calcium-vitamin D  1 tablet Oral Daily  . cholecalciferol  1,000 Units Oral Daily  . enoxaparin (LOVENOX) injection  40 mg Subcutaneous Q24H  . ezetimibe  10 mg Oral QHS  . feeding supplement (ENSURE ENLIVE)  237 mL Oral TID BM  . metoprolol succinate  37.5 mg Oral BID  . potassium chloride SA  20 mEq Oral Daily  . tamsulosin  0.4 mg Oral QPC supper  . umeclidinium-vilanterol  1 puff Inhalation Daily   Continuous Infusions: . sodium chloride 100 mL/hr at 08/18/17 0333     LOS: 0 days    Time spent: 30 minutes    Merleen Picazo Darleen Crocker, DO [facility] Pager [phone]  If 7PM-7AM, please contact night-coverage [email or web address] Password [email or web address] 08/18/2017, 12:48  PM

## 2017-08-18 NOTE — Progress Notes (Addendum)
ANTICOAGULATION CONSULT NOTE - Initial Consult  Pharmacy Consult for heparin Indication: DVT  Allergies  Allergen Reactions  . Acetaminophen   . Codeine Other (See Comments)    Unknown reaction  . Ibuprofen     MD took patient off as he had a blood clot in his leg    Patient Measurements: Height: 5\' 8"  (172.7 cm) Weight: 170 lb 13.7 oz (77.5 kg) IBW/kg (Calculated) : 68.4   Vital Signs: Temp: 98 F (36.7 C) (08/12 1406) Temp Source: Oral (08/12 1406) BP: 156/98 (08/12 1406) Pulse Rate: 99 (08/12 1406)  Labs: Recent Labs    08/16/17 0631  CREATININE 1.14    Estimated Creatinine Clearance: 45.8 mL/min (by C-G formula based on SCr of 1.14 mg/dL).   Medical History: Past Medical History:  Diagnosis Date  . Arthritis   . COPD (chronic obstructive pulmonary disease) ([place])   . GERD (gastroesophageal reflux disease)   . HOH (hard of hearing)   . Hypertension   . Shortness of breath    exertion    Medications:  Medications Prior to Admission  Medication Sig Dispense Refill Last Dose  . Calcium Carbonate-Vitamin D (CALCIUM 500 + D) 500-125 MG-UNIT TABS Take 1 tablet by mouth daily.   08/13/2017 at Unknown time  . carbamide peroxide (DEBROX) 6.5 % otic solution Place 5 drops into both ears 2 (two) times daily as needed (ear wax removal).    Taking  . cholecalciferol (VITAMIN D) 1000 units tablet Take 1,000 Units by mouth daily.   08/13/2017 at Unknown time  . dexlansoprazole (DEXILANT) 60 MG capsule Take 60 mg by mouth daily.   08/13/2017 at Unknown time  . furosemide (LASIX) 40 MG tablet Take 40 mg by mouth daily.   08/13/2017 at Unknown time  . levalbuterol (XOPENEX) 0.63 MG/3ML nebulizer solution Take 3 mLs (0.63 mg total) by nebulization every 8 (eight) hours as needed for wheezing or shortness of breath. 3 mL 12 Taking  . metoprolol succinate (TOPROL-XL) 50 MG 24 hr tablet Take 50 mg by mouth daily.   08/13/2017 at 800  . Multiple Vitamin (MULTI-DAY VITAMINS PO) Take 1  tablet by mouth daily.   08/13/2017 at Unknown time  . potassium chloride SA (K-DUR,KLOR-CON) 20 MEQ tablet Take 20 mEq by mouth daily.    08/13/2017 at Unknown time  . umeclidinium-vilanterol (ANORO ELLIPTA) 62.5-25 MCG/INH AEPB Inhale 1 puff into the lungs daily.   Past Week at Unknown time  . ZETIA 10 MG tablet Take 10 mg by mouth daily.   08/13/2017 at Unknown time  . meclizine (ANTIVERT) 25 MG tablet Take 25 mg by mouth 4 (four) times daily as needed for dizziness.    Not Taking at Unknown time    Assessment: Pharmacy consulted to dose heparin in patient with DVT.  Goal of Therapy:  Heparin level 0.3-0.7 units/ml Monitor platelets by anticoagulation protocol: Yes   Plan:  4000 units bolus x 1 Start heparin infusion at 1200 units/hr Check anti-Xa level in 8 hours and daily while on heparin Continue to monitor H&H and platelets   Ramond Craver 08/18/2017,5:24 PM

## 2017-08-18 NOTE — Clinical Social Work Note (Signed)
Patient's sister stated that her daughter, Elmo Putt, was POA and to contact her regarding placement.   LCSW left a message for patient's niece, Elmo Putt, requesting return contact.    Petina Muraski, Clydene Pugh, LCSW

## 2017-08-18 NOTE — Progress Notes (Signed)
Notified by [employer], Patient had 5 beat run of VTach. Dr. Myna Hidalgo, MD notified. Patient is laying in bed sleeping at this time. Will continue to monitor.

## 2017-08-18 NOTE — Progress Notes (Signed)
Physical Therapy Treatment Patient Details Name: Kurt Burke MRN: [id number] DOB: [birth year] Today's Date: 08/18/2017    History of Present Illness Kurt Burke is a 84 y.o. male with medical history significant of COPD, cirrhosis per chart brought in by family members for confusion.  Cannot obtain any history from patient due to his confusion at this time all history is obtained from chart and nursing staff.  And ED staff.  Family reported that he has not been eating well he has been weaker than normal with confusion.  Patient brought to the emergency department for further evaluation.  Dr. Laverta Baltimore in the ED referred for request for further imaging such as an MRI.  Work-up in the ED revealing mild renal injury and dehydration otherwise no source of infection.    PT Comments    Pt was agitated today about moving LLE, and when PT noted this informed nursing.  He has a colder and darker L foot under his sock, and made nursing aware of this comparison to RLE.  Worked on gentle ROM to both legs with no pain to move RLE and much pain with LLE.  Will follow along and ck back with nursing about this concern before next session.     Follow Up Recommendations  SNF     Equipment Recommendations  None recommended by PT    Recommendations for Other Services       Precautions / Restrictions Precautions Precautions: Fall Restrictions Weight Bearing Restrictions: No    Mobility  Bed Mobility               General bed mobility comments: pt will not let PT sit him up  Transfers                    Ambulation/Gait                 Stairs             Wheelchair Mobility    Modified Rankin (Stroke Patients Only)       Balance                                            Cognition Arousal/Alertness: Awake/alert Behavior During Therapy: Agitated;WFL for tasks assessed/performed Overall Cognitive Status: Impaired/Different from  baseline Area of Impairment: Awareness;Problem solving;Safety/judgement;Following commands                       Following Commands: Follows one step commands with increased time;Follows one step commands inconsistently Safety/Judgement: Decreased awareness of safety;Decreased awareness of deficits Awareness: Intellectual Problem Solving: Slow processing;Decreased initiation        Exercises General Exercises - Lower Extremity Ankle Circles/Pumps: AAROM;Both;5 reps Heel Slides: AAROM;Both;10 reps Hip ABduction/ADduction: AAROM;Both;10 reps Hip Flexion/Marching: AAROM;Both;10 reps    General Comments        Pertinent Vitals/Pain Pain Assessment: Faces Faces Pain Scale: Hurts whole lot Pain Location: LLe with any touching Pain Descriptors / Indicators: Tender;Sharp Pain Intervention(s): Limited activity within patient's tolerance;Monitored during session;Other (comment)(took nursing in to see LLE with description of the complaint)    Home Living                      Prior Function            PT Goals (current goals can now  be found in the care plan section) Acute Rehab PT Goals Patient Stated Goal: none stated Progress towards PT goals: Not progressing toward goals - comment    Frequency    Min 3X/week      PT Plan Current plan remains appropriate    Co-evaluation              AM-PAC PT "6 Clicks" Daily Activity  Outcome Measure  Difficulty turning over in bed (including adjusting bedclothes, sheets and blankets)?: Unable Difficulty moving from lying on back to sitting on the side of the bed? : Unable Difficulty sitting down on and standing up from a chair with arms (e.g., wheelchair, bedside commode, etc,.)?: Unable Help needed moving to and from a bed to chair (including a wheelchair)?: Total Help needed walking in hospital room?: Total Help needed climbing 3-5 steps with a railing? : Total 6 Click Score: 6    End of Session    Activity Tolerance: Patient limited by pain Patient left: in bed;with call bell/phone within reach;with bed alarm set Nurse Communication: Mobility status PT Visit Diagnosis: Unsteadiness on feet (R26.81)     Time: 1856-3149 PT Time Calculation (min) (ACUTE ONLY): 17 min  Charges:  $Therapeutic Exercise: 8-22 mins                      Ramond Dial 08/18/2017, 11:28 AM   Mee Hives, PT MS Acute Rehab Dept. Number: [place] [facility] [place]

## 2017-08-19 DIAGNOSIS — Z960 Presence of urogenital implants: Secondary | ICD-10-CM | POA: Diagnosis not present

## 2017-08-19 DIAGNOSIS — I998 Other disorder of circulatory system: Secondary | ICD-10-CM | POA: Diagnosis not present

## 2017-08-19 DIAGNOSIS — E860 Dehydration: Secondary | ICD-10-CM | POA: Diagnosis not present

## 2017-08-19 DIAGNOSIS — R5381 Other malaise: Secondary | ICD-10-CM | POA: Diagnosis not present

## 2017-08-19 DIAGNOSIS — R609 Edema, unspecified: Secondary | ICD-10-CM | POA: Diagnosis not present

## 2017-08-19 DIAGNOSIS — R4182 Altered mental status, unspecified: Secondary | ICD-10-CM | POA: Diagnosis not present

## 2017-08-19 DIAGNOSIS — J449 Chronic obstructive pulmonary disease, unspecified: Secondary | ICD-10-CM | POA: Diagnosis not present

## 2017-08-19 DIAGNOSIS — I70208 Unspecified atherosclerosis of native arteries of extremities, other extremity: Secondary | ICD-10-CM | POA: Diagnosis not present

## 2017-08-19 DIAGNOSIS — R339 Retention of urine, unspecified: Secondary | ICD-10-CM | POA: Diagnosis not present

## 2017-08-19 DIAGNOSIS — Z79899 Other long term (current) drug therapy: Secondary | ICD-10-CM | POA: Diagnosis not present

## 2017-08-19 DIAGNOSIS — I724 Aneurysm of artery of lower extremity: Secondary | ICD-10-CM | POA: Diagnosis not present

## 2017-08-19 DIAGNOSIS — Z888 Allergy status to other drugs, medicaments and biological substances status: Secondary | ICD-10-CM | POA: Diagnosis not present

## 2017-08-19 DIAGNOSIS — H9190 Unspecified hearing loss, unspecified ear: Secondary | ICD-10-CM | POA: Diagnosis not present

## 2017-08-19 DIAGNOSIS — R1312 Dysphagia, oropharyngeal phase: Secondary | ICD-10-CM | POA: Diagnosis not present

## 2017-08-19 DIAGNOSIS — I82402 Acute embolism and thrombosis of unspecified deep veins of left lower extremity: Secondary | ICD-10-CM | POA: Diagnosis not present

## 2017-08-19 DIAGNOSIS — I10 Essential (primary) hypertension: Secondary | ICD-10-CM | POA: Diagnosis not present

## 2017-08-19 DIAGNOSIS — Z885 Allergy status to narcotic agent status: Secondary | ICD-10-CM | POA: Diagnosis not present

## 2017-08-19 DIAGNOSIS — R821 Myoglobinuria: Secondary | ICD-10-CM | POA: Diagnosis not present

## 2017-08-19 DIAGNOSIS — D649 Anemia, unspecified: Secondary | ICD-10-CM | POA: Diagnosis not present

## 2017-08-19 DIAGNOSIS — Z86718 Personal history of other venous thrombosis and embolism: Secondary | ICD-10-CM | POA: Diagnosis not present

## 2017-08-19 DIAGNOSIS — Z66 Do not resuscitate: Secondary | ICD-10-CM | POA: Diagnosis not present

## 2017-08-19 DIAGNOSIS — E43 Unspecified severe protein-calorie malnutrition: Secondary | ICD-10-CM | POA: Diagnosis not present

## 2017-08-19 DIAGNOSIS — M79672 Pain in left foot: Secondary | ICD-10-CM | POA: Diagnosis not present

## 2017-08-19 DIAGNOSIS — R52 Pain, unspecified: Secondary | ICD-10-CM | POA: Diagnosis not present

## 2017-08-19 DIAGNOSIS — N179 Acute kidney failure, unspecified: Secondary | ICD-10-CM | POA: Diagnosis not present

## 2017-08-19 DIAGNOSIS — M7989 Other specified soft tissue disorders: Secondary | ICD-10-CM | POA: Diagnosis not present

## 2017-08-19 DIAGNOSIS — K219 Gastro-esophageal reflux disease without esophagitis: Secondary | ICD-10-CM | POA: Diagnosis not present

## 2017-08-19 DIAGNOSIS — F0390 Unspecified dementia without behavioral disturbance: Secondary | ICD-10-CM | POA: Diagnosis present

## 2017-08-19 DIAGNOSIS — R627 Adult failure to thrive: Secondary | ICD-10-CM | POA: Diagnosis not present

## 2017-08-19 DIAGNOSIS — I80202 Phlebitis and thrombophlebitis of unspecified deep vessels of left lower extremity: Secondary | ICD-10-CM | POA: Diagnosis not present

## 2017-08-19 DIAGNOSIS — Z8249 Family history of ischemic heart disease and other diseases of the circulatory system: Secondary | ICD-10-CM | POA: Diagnosis not present

## 2017-08-19 DIAGNOSIS — I82492 Acute embolism and thrombosis of other specified deep vein of left lower extremity: Secondary | ICD-10-CM | POA: Diagnosis not present

## 2017-08-19 DIAGNOSIS — Z515 Encounter for palliative care: Secondary | ICD-10-CM | POA: Diagnosis not present

## 2017-08-19 DIAGNOSIS — R600 Localized edema: Secondary | ICD-10-CM | POA: Diagnosis not present

## 2017-08-19 DIAGNOSIS — M6281 Muscle weakness (generalized): Secondary | ICD-10-CM | POA: Diagnosis not present

## 2017-08-19 DIAGNOSIS — Z87891 Personal history of nicotine dependence: Secondary | ICD-10-CM | POA: Diagnosis not present

## 2017-08-19 DIAGNOSIS — Z7401 Bed confinement status: Secondary | ICD-10-CM | POA: Diagnosis not present

## 2017-08-19 DIAGNOSIS — I82502 Chronic embolism and thrombosis of unspecified deep veins of left lower extremity: Secondary | ICD-10-CM | POA: Diagnosis not present

## 2017-08-19 DIAGNOSIS — Z7189 Other specified counseling: Secondary | ICD-10-CM | POA: Diagnosis not present

## 2017-08-19 DIAGNOSIS — K7460 Unspecified cirrhosis of liver: Secondary | ICD-10-CM | POA: Diagnosis not present

## 2017-08-19 DIAGNOSIS — I999 Unspecified disorder of circulatory system: Secondary | ICD-10-CM | POA: Diagnosis not present

## 2017-08-19 DIAGNOSIS — Z7901 Long term (current) use of anticoagulants: Secondary | ICD-10-CM | POA: Diagnosis not present

## 2017-08-19 LAB — BASIC METABOLIC PANEL
Anion gap: 7 (ref 5–15)
BUN: 26 mg/dL — ABNORMAL HIGH (ref 8–23)
CHLORIDE: 116 mmol/L — AB (ref 98–111)
CO2: 22 mmol/L (ref 22–32)
Calcium: 9.4 mg/dL (ref 8.9–10.3)
Creatinine, Ser: 1.27 mg/dL — ABNORMAL HIGH (ref 0.61–1.24)
GFR calc Af Amer: 58 mL/min — ABNORMAL LOW (ref >60)
GFR calc non Af Amer: 50 mL/min — ABNORMAL LOW (ref >60)
GLUCOSE: 100 mg/dL — AB (ref 70–99)
POTASSIUM: 4.2 mmol/L (ref 3.5–5.1)
Sodium: 145 mmol/L (ref 135–145)

## 2017-08-19 LAB — CBC
HEMATOCRIT: 33.1 % — AB (ref 39.0–52.0)
Hemoglobin: 10.2 g/dL — ABNORMAL LOW (ref 13.0–17.0)
MCH: 28.4 pg (ref 26.0–34.0)
MCHC: 30.8 g/dL (ref 30.0–36.0)
MCV: 92.2 fL (ref 78.0–100.0)
Platelets: 188 10*3/uL (ref 150–400)
RBC: 3.59 MIL/uL — ABNORMAL LOW (ref 4.22–5.81)
RDW: 15 % (ref 11.5–15.5)
WBC: 7.7 10*3/uL (ref 4.0–10.5)

## 2017-08-19 LAB — HEPARIN LEVEL (UNFRACTIONATED): Heparin Unfractionated: 0.39 IU/mL (ref 0.30–0.70)

## 2017-08-19 MED ORDER — METOPROLOL SUCCINATE ER 25 MG PO TB24: 37.5000 mg | ORAL_TABLET | Freq: Two times a day (BID) | ORAL | 0 refills | Status: AC

## 2017-08-19 MED ORDER — ENSURE ENLIVE PO LIQD: 237.0000 mL | Freq: Three times a day (TID) | ORAL | 12 refills | Status: AC

## 2017-08-19 MED ORDER — ENOXAPARIN SODIUM 80 MG/0.8ML ~~LOC~~ SOLN: 1.0000 mg/kg | Freq: Two times a day (BID) | SUBCUTANEOUS | 0 refills | Status: AC

## 2017-08-19 MED ORDER — SODIUM CHLORIDE 0.45 % IV SOLN
INTRAVENOUS | Status: DC
  Administered 2017-08-19: 15:00:00 via INTRAVENOUS

## 2017-08-19 MED ORDER — TAMSULOSIN HCL 0.4 MG PO CAPS: 0.4000 mg | ORAL_CAPSULE | Freq: Every day | ORAL | 0 refills | Status: AC

## 2017-08-19 NOTE — Discharge Summary (Signed)
Physician Discharge Summary  Kurt Burke LKG:[id number] DOB: 1929/07/17 DOA: 08/14/2017  PCP: Celene Squibb, MD  Admit date: 08/14/2017  Discharge date: 08/19/2017  Admitted From:Home  Disposition:  SNF  Recommendations for Outpatient Follow-up:  1. Follow up with PCP in 1-2 weeks  Home Health:N/A  Equipment/Devices:N/A  Discharge Condition:STable  CODE STATUS: Full  Diet recommendation: Heart Healthy  Brief/Interim Summary:  84 year old male with a history of COPD, hypertension, brought to the hospital with increasing confusion. Found to have dehydration and acute kidney injury. Started on IV fluidswith improvement of renal function with creatinine now uptrending. Work-up has been unrevealing. Unable to tolerate MRI.Moderate to severe baseline dementia per discussion with family members earlier in the week.  His altered mentation is improved.  Palliative care had been consulted and is discussing his case with the niece who is the power of attorney.  He does not appear to be a candidate for any aggressive measures and has developed left lower extremity edema with noted extensive DVT that is proximal.  He had been started on a heparin drip on 8/12 and continues to have pain and swelling for which Lovenox [place] injections will be prescribed twice daily. This will likely need to be continued indefinitely as his DVT is unprovoked.  Discharge Diagnoses:  Principal Problem:   Altered mental status Active Problems:   Cirrhosis ([place])   AKI (acute kidney injury) ([place])   Dehydration   Protein-calorie malnutrition, severe   Goals of care, counseling/discussion   Palliative care by specialist   DNR (do not resuscitate) discussion  1. Altered mental status-improved.Afteran extensive discussion with the patient's niece who is his main caregiver, she reportedthat at baseline, he is able to carry on a conversation and knows where he is. She also reportedthat he no longer drives,  pays his bills, and at times gets confused when new people come to the house. I suspect that he does have some degree of underlying dementia. It appears that his symptoms have more recently worsened. Thus far work-up has been unrevealing. He does not have any signs of infection. Metabolic work-up has been unrevealing other than dehydration which has since improved with fluids. He cannot undergo MRI due to severe agitation. He did receive benzodiazepines prior to scan, but did not tolerate being in the scanner. B12 checked and is at lower end of normal with replacement given. Overall, since admission his mental status does appear to be improving. 2. Left lower extremity proximal, symptomatic DVT.  Continue on Lovenox BID, as DVT is quite extensive and he is symptomatic, on discharge to SNF.  He is a poor candidate for thrombectomy or any other procedures at this point.  This appears to be unprovoked and he will likely require anticoagulation long-term.  No need to further explore other factors at this point given low life expectancy at this point. 3. Acute kidney injury-stable.  Continue on IV fluid as he has poor appetite for now and change to half-normal saline.  Avoid nephrotoxic agents. 4. Hypertension. Blood pressure currently stable. Continue on metoprolol.He is having episodes of tachycardia.Continue Toprol at 37.5 mg twice daily with improvement in tachycardia noted overall. 5. COPD. ABG did not show any significant CO2 retention. No shortness of breath or wheezing. Continue on Anoro Ellipta. 6. Generalized weakness. Seen by physical therapy recommended skilled nursing facility placement. 7. Urinary retention. Patient had difficulty passing urine. Foley catheter was placed with expulsion of 800 cc of urine. Started on Flomax. May attempt trial of removal  at the facility.  Discharge Instructions  Discharge Instructions    Diet - low sodium heart healthy   Complete by:  As  directed    Increase activity slowly   Complete by:  As directed      Allergies as of 08/19/2017      Reactions   Acetaminophen    Codeine Other (See Comments)   Unknown reaction   Ibuprofen    MD took patient off as he had a blood clot in his leg      Medication List    STOP taking these medications   furosemide 40 MG tablet Commonly known as:  LASIX   potassium chloride SA 20 MEQ tablet Commonly known as:  K-DUR,KLOR-CON     TAKE these medications   ANORO ELLIPTA 62.5-25 MCG/INH Aepb Generic drug:  umeclidinium-vilanterol Inhale 1 puff into the lungs daily.   CALCIUM 500 + D 500-125 MG-UNIT Tabs Generic drug:  Calcium Carbonate-Vitamin D Take 1 tablet by mouth daily.   carbamide peroxide 6.5 % OTIC solution Commonly known as:  DEBROX Place 5 drops into both ears 2 (two) times daily as needed (ear wax removal).   cholecalciferol 1000 units tablet Commonly known as:  VITAMIN D Take 1,000 Units by mouth daily.   dexlansoprazole 60 MG capsule Commonly known as:  DEXILANT Take 60 mg by mouth daily.   enoxaparin 80 MG/0.8ML injection Commonly known as:  LOVENOX Inject 0.75 mLs (75 mg total) into the skin 2 (two) times daily.   feeding supplement (ENSURE ENLIVE) Liqd Take 237 mLs by mouth 3 (three) times daily between meals.   levalbuterol 0.63 MG/3ML nebulizer solution Commonly known as:  XOPENEX Take 3 mLs (0.63 mg total) by nebulization every 8 (eight) hours as needed for wheezing or shortness of breath.   meclizine 25 MG tablet Commonly known as:  ANTIVERT Take 25 mg by mouth 4 (four) times daily as needed for dizziness.   metoprolol succinate 25 MG 24 hr tablet Commonly known as:  TOPROL-XL Take 1.5 tablets (37.5 mg total) by mouth 2 (two) times daily. What changed:    medication strength  how much to take  when to take this   MULTI-DAY VITAMINS PO Take 1 tablet by mouth daily.   tamsulosin 0.4 MG Caps capsule Commonly known as:  FLOMAX Take  1 capsule (0.4 mg total) by mouth daily after supper.   ZETIA 10 MG tablet Generic drug:  ezetimibe Take 10 mg by mouth daily.       Contact information for follow-up providers    Celene Squibb, MD Follow up in 1 week(s).   Specialty:  Internal Medicine Contact information: [place] [address] [phone]            Contact information for after-discharge care    Destination    [facility] .   Service:  Skilled Nursing Contact information: [place] [phone]                 Allergies  Allergen Reactions  . Acetaminophen   . Codeine Other (See Comments)    Unknown reaction  . Ibuprofen     MD took patient off as he had a blood clot in his leg    Consultations:  None   Procedures/Studies: Ct Head Wo Contrast  Result Date: 08/14/2017 CLINICAL DATA:  84 year old male with altered level of consciousness today. Initial encounter. EXAM: CT HEAD WITHOUT CONTRAST  TECHNIQUE: Contiguous axial images were obtained from the base of the skull through the vertex without intravenous contrast. COMPARISON:  06/15/2012 head CT. FINDINGS: Brain: No intracranial hemorrhage or CT evidence of large acute infarct. Remote right thalamic infarct. Moderate chronic microvascular changes. Moderate global atrophy. No intracranial mass lesion noted on this unenhanced exam. Vascular: Vascular calcifications.  No hyperdense vessel. Skull: No acute abnormality. Sinuses/Orbits: Post lens replacement. No acute orbital abnormality. Minimal mucosal thickening right maxillary sinus. Other: Mastoid air cells and minimally cavities are clear. Prominent scalp calcifications once again noted. IMPRESSION: 1. No acute intracranial abnormality. 2. Remote small right thalamic infarct. Moderate chronic microvascular changes. 3. Moderate global atrophy. Electronically Signed   By: Genia Del M.D.   On: 08/14/2017 16:42   US  Venous Img Lower Unilateral Left  Result Date: 08/18/2017 CLINICAL DATA:  84 year old with left lower extremity swelling. EXAM: LEFT LOWER EXTREMITY VENOUS DOPPLER ULTRASOUND TECHNIQUE: Gray-scale sonography with graded compression, as well as color Doppler and duplex ultrasound were performed to evaluate the lower extremity deep venous systems from the level of the common femoral vein and including the common femoral, femoral, profunda femoral, popliteal and calf veins including the posterior tibial, peroneal and gastrocnemius veins when visible. The superficial great saphenous vein was also interrogated. Spectral Doppler was utilized to evaluate flow at rest and with distal augmentation maneuvers in the common femoral, femoral and popliteal veins. COMPARISON:  06/02/2013 FINDINGS: Contralateral Common Femoral Vein: Patient declined this portion of the examination. Common Femoral Vein: Positive for thrombus. Echogenic thrombus in the left common femoral vein and no significant flow in the left common femoral vein. Saphenofemoral Junction: Not imaged. Profunda Femoral Vein: Not well visualized but suspect there is thrombus in the profunda femoral vein. Femoral Vein: Positive for thrombus. The femoral vein contains echogenic thrombus and the vein is non compressible. No significant flow within the left femoral vein. Popliteal Vein: Positive for thrombus. Left popliteal vein is expanded with echogenic thrombus. No significant flow within the left popliteal vein. Calf Veins: Positive for thrombus in the gastrocnemius veins. There is blood flow in the posterior tibial veins but these veins are incompletely characterized. Superficial Great Saphenous Vein: No evidence of thrombus. Normal compressibility. Other Findings:  Subcutaneous edema in the left calf. IMPRESSION: Positive for deep venous thrombosis in the left lower extremity. Thrombus extending from the left calf up to the left common femoral vein. Electronically  Signed   By: Markus Daft M.D.   On: 08/18/2017 17:42   Dg Chest Port 1 View  Result Date: 08/14/2017 CLINICAL DATA:  COPD, former smoker. Altered mental status. History of COPD. EXAM: PORTABLE CHEST 1 VIEW COMPARISON:  Frontal chest x-ray of November 26, 2015 FINDINGS: The right hemidiaphragm remains higher than the left. There is no focal infiltrate. There is no pleural effusion. The heart and pulmonary vascularity are normal. There is tortuosity of the descending thoracic aorta with mural calcification. The bony thorax exhibits no acute abnormality. IMPRESSION: Chronic elevation of the right hemidiaphragm.  No pneumonia nor CHF. Thoracic aortic atherosclerosis. Electronically Signed   By: David  Martinique M.D.   On: 08/14/2017 14:07    Discharge Exam: Vitals:   08/19/17 0628 08/19/17 1308  BP: (!) 148/98 (!) 153/91  Pulse: 98 99  Resp: 18 (!) 22  Temp:  98.5 F (36.9 C)  SpO2:  100%   Vitals:   08/18/17 2101 08/19/17 0500 08/19/17 0628 08/19/17 1308  BP: 134/75  (!) 148/98 (!) 153/91  Pulse: Marland Kitchen)  109  98 99  Resp: 18  18 (!) 22  Temp:    98.5 F (36.9 C)  TempSrc:    Oral  SpO2:    100%  Weight:  76.9 kg    Height:        General: Pt is alert, awake, not in acute distress Cardiovascular: RRR, S1/S2 +, no rubs, no gallops Respiratory: CTA bilaterally, no wheezing, no rhonchi Abdominal: Soft, NT, ND, bowel sounds + Extremities: no edema, no cyanosis    The results of significant diagnostics from this hospitalization (including imaging, microbiology, ancillary and laboratory) are listed below for reference.     Microbiology: No results found for this or any previous visit (from the past 240 hour(s)).   Labs: BNP (last 3 results) No results for input(s): BNP in the last 8760 hours. Basic Metabolic Panel: Recent Labs  Lab 08/14/17 1158 08/15/17 0133 08/16/17 0631 08/19/17 0227  NA 138 141 141 145  K 4.4 3.9 4.3 4.2  CL 104 110 111 116*  CO2 26 23 22 22   GLUCOSE 129*  98 119* 100*  BUN 35* 27* 21 26*  CREATININE 1.68* 1.27* 1.14 1.27*  CALCIUM 9.9 9.2 9.3 9.4   Liver Function Tests: Recent Labs  Lab 08/14/17 1657  AST 31  ALT 27  ALKPHOS 48  BILITOT 1.0  PROT 6.8  ALBUMIN 3.0*   No results for input(s): LIPASE, AMYLASE in the last 168 hours. Recent Labs  Lab 08/14/17 1857  AMMONIA 24   CBC: Recent Labs  Lab 08/14/17 1158 08/15/17 0133 08/19/17 0227  WBC 13.8* 8.3 7.7  HGB 11.7* 11.0* 10.2*  HCT 38.4* 35.6* 33.1*  MCV 93.7 93.2 92.2  PLT 262 256 188   Cardiac Enzymes: Recent Labs  Lab 08/14/17 1657 08/14/17 1857 08/15/17 0133 08/15/17 0811  TROPONINI 0.04* 0.04* 0.04* 0.04*   BNP: Invalid input(s): POCBNP CBG: No results for input(s): GLUCAP in the last 168 hours. D-Dimer No results for input(s): DDIMER in the last 72 hours. Hgb A1c No results for input(s): HGBA1C in the last 72 hours. Lipid Profile No results for input(s): CHOL, HDL, LDLCALC, TRIG, CHOLHDL, LDLDIRECT in the last 72 hours. Thyroid function studies No results for input(s): TSH, T4TOTAL, T3FREE, THYROIDAB in the last 72 hours.  Invalid input(s): FREET3 Anemia work up Recent Labs    08/17/17 0551  VITAMINB12 250   Urinalysis    Component Value Date/Time   COLORURINE YELLOW 08/14/2017 [place] 08/14/2017 1355   LABSPEC 1.019 08/14/2017 1355   PHURINE 5.0 08/14/2017 1355   GLUCOSEU NEGATIVE 08/14/2017 1355   HGBUR NEGATIVE 08/14/2017 [place] 08/14/2017 [place] 08/14/2017 1355   PROTEINUR NEGATIVE 08/14/2017 1355   UROBILINOGEN 0.2 07/21/2012 1540   NITRITE NEGATIVE 08/14/2017 1355   LEUKOCYTESUR NEGATIVE 08/14/2017 1355   Sepsis Labs Invalid input(s): PROCALCITONIN,  WBC,  LACTICIDVEN Microbiology No results found for this or any previous visit (from the past 240 hour(s)).   Time coordinating discharge: 40 minutes  SIGNED:   Rodena Goldmann, DO [facility] 08/19/2017,  4:07 PM Pager [phone]  If 7PM-7AM, please contact night-coverage [email or web address] Password [email or web address]

## 2017-08-19 NOTE — Clinical Social Work Placement (Signed)
   CLINICAL SOCIAL WORK PLACEMENT  NOTE  Date:  08/19/2017  Patient Details  Name: Kurt Burke MRN: [id number] Date of Birth: 11/26/1929  Clinical Social Work is seeking post-discharge placement for this patient at the [place] level of care (*CSW will initial, date and re-position this form in  chart as items are completed):  Yes   Patient/family provided with [place] [facility] Work Department's list of facilities offering this level of care within the geographic area requested by the patient (or if unable, by the patient's family).  Yes   Patient/family informed of their freedom to choose among providers that offer the needed level of care, that participate in Medicare, Medicaid or managed care program needed by the patient, have an available bed and are willing to accept the patient.  Yes   Patient/family informed of [facility]'s ownership interest in [facility] and [facility], as well as of the fact that they are under no obligation to receive care at these facilities.  PASRR submitted to EDS on 08/18/17     PASRR number received on 08/18/17     Existing PASRR number confirmed on       FL2 transmitted to all facilities in geographic area requested by pt/family on 08/18/17     FL2 transmitted to all facilities within larger geographic area on       Patient informed that his/her managed care company has contracts with or will negotiate with certain facilities, including the following:        Yes   Patient/family informed of bed offers received.  Patient chooses bed at [facility]     Physician recommends and patient chooses bed at      Patient to be transferred to [facility] on 08/19/17.  Patient to be transferred to facility by [facility]     Patient family notified on 08/19/17 of transfer.  Name of family member notified:  message left for niece Elmo Putt.      PHYSICIAN       Additional Comment:  Discharge  clinicals sent. Facility notified. LCSW signing off.   _______________________________________________ Ihor Gully, LCSW 08/19/2017, 4:14 PM

## 2017-08-19 NOTE — Progress Notes (Signed)
ANTICOAGULATION CONSULT NOTE - follow up [facility] for heparin Indication: DVT  Allergies  Allergen Reactions  . Acetaminophen   . Codeine Other (See Comments)    Unknown reaction  . Ibuprofen     MD took patient off as he had a blood clot in his leg    Patient Measurements: Height: 5\' 8"  (172.7 cm) Weight: 169 lb 8.5 oz (76.9 kg) IBW/kg (Calculated) : 68.4   Vital Signs: BP: 148/98 (08/13 0628) Pulse Rate: 98 (08/13 0628)  Labs: Recent Labs    08/19/17 0227  HGB 10.2*  HCT 33.1*  PLT 188  HEPARINUNFRC 0.39  CREATININE 1.27*    Estimated Creatinine Clearance: 41.1 mL/min (A) (by C-G formula based on SCr of 1.27 mg/dL (H)).   Medical History: Past Medical History:  Diagnosis Date  . Arthritis   . COPD (chronic obstructive pulmonary disease) ([place])   . GERD (gastroesophageal reflux disease)   . HOH (hard of hearing)   . Hypertension   . Shortness of breath    exertion    Medications:  Medications Prior to Admission  Medication Sig Dispense Refill Last Dose  . Calcium Carbonate-Vitamin D (CALCIUM 500 + D) 500-125 MG-UNIT TABS Take 1 tablet by mouth daily.   08/13/2017 at Unknown time  . carbamide peroxide (DEBROX) 6.5 % otic solution Place 5 drops into both ears 2 (two) times daily as needed (ear wax removal).    Taking  . cholecalciferol (VITAMIN D) 1000 units tablet Take 1,000 Units by mouth daily.   08/13/2017 at Unknown time  . dexlansoprazole (DEXILANT) 60 MG capsule Take 60 mg by mouth daily.   08/13/2017 at Unknown time  . furosemide (LASIX) 40 MG tablet Take 40 mg by mouth daily.   08/13/2017 at Unknown time  . levalbuterol (XOPENEX) 0.63 MG/3ML nebulizer solution Take 3 mLs (0.63 mg total) by nebulization every 8 (eight) hours as needed for wheezing or shortness of breath. 3 mL 12 Taking  . metoprolol succinate (TOPROL-XL) 50 MG 24 hr tablet Take 50 mg by mouth daily.   08/13/2017 at 800  . Multiple Vitamin (MULTI-DAY VITAMINS PO) Take 1 tablet  by mouth daily.   08/13/2017 at Unknown time  . potassium chloride SA (K-DUR,KLOR-CON) 20 MEQ tablet Take 20 mEq by mouth daily.    08/13/2017 at Unknown time  . umeclidinium-vilanterol (ANORO ELLIPTA) 62.5-25 MCG/INH AEPB Inhale 1 puff into the lungs daily.   Past Week at Unknown time  . ZETIA 10 MG tablet Take 10 mg by mouth daily.   08/13/2017 at Unknown time  . meclizine (ANTIVERT) 25 MG tablet Take 25 mg by mouth 4 (four) times daily as needed for dizziness.    Not Taking at Unknown time    Assessment: Pharmacy consulted to dose heparin in patient with DVT. Heparin level is therapeutic.  Goal of Therapy:  Heparin level 0.3-0.7 units/ml Monitor platelets by anticoagulation protocol: Yes   Plan:  Continue heparin infusion at 1200 units/hr Check anti-Xa level daily while on heparin Continue to monitor H&H and platelets F/U plan for oral treatment  Isac Sarna, BS [place], BCPS Clinical Pharmacist Pager [phone] 08/19/2017,8:01 AM

## 2017-08-19 NOTE — Progress Notes (Signed)
Palliative: Kurt Burke is resting quietly in bed. No family at bedside.  Call to niece, Kurt Burke.  She shares that Kurt Burke is not married, has no children.  He has a sister Kurt Burke, Kurt Burke.  Kurt Burke shares that her Burke is unable to manage his care. We talked in detail about Kurt Burke chronic health concerns.   We discuss the finding of DVT in LLE and treatment plan. We discuss his COPD, HTN, GERD, arthritis, hearing loss. Kurt Burke states they had an appointment for new hearing aids with the [place].  We discuss the pros and struggles with hearing aids.  We talk about healthcare POA and AD. We discuss to treat the treatable, but allow a natural death.  Kurt Burke states she will discuss with her Burke, patients sister.  Encouraged to lean on SW at [facility] and to call PMT as needed.  81 minutes Kurt Axe, NP Palliative Medicine Team Team Phone # [phone]  Greater than 50% of this time was spent counseling and coordinating care related to the above assessment and plan.

## 2017-08-19 NOTE — Progress Notes (Signed)
Patient has had poor appetite reported, he did not want breakfast or Ensure when offered this am. Notified Dr. [name]. Will assist with feedings at meal times and encourage intake as tolerated. Donavan Foil, RN

## 2017-08-19 NOTE — Progress Notes (Signed)
Report given to Seymour Bars, receiving nurse at [facility]. Ambrose Pancoast, CSW states patient's niece Elmo Putt is aware of discharge plan and she left message to notify her he would be discharged this afternoon. EMS present to transport patient at discharge. Discharge packet sent with patient via EMS transport. Donavan Foil, RN

## 2017-08-19 NOTE — Progress Notes (Signed)
PROGRESS NOTE    Kurt Burke  XTG:[id number] DOB: [birth year] DOA: 08/14/2017 PCP: Celene Squibb, MD   Brief Narrative:   84 year old male with a history of COPD, hypertension, brought to the hospital with increasing confusion. Found to have dehydration and acute kidney injury. Started on IV fluidswith improvement of renal function with creatinine now uptrending. Work-up has been unrevealing. Unable to tolerate MRI.   Moderate to severe baseline dementia per discussion with family members earlier in the week.   His altered mentation is improved.  Palliative care had been consulted and is discussing his case with the niece who is the power of attorney.  He does not appear to be a candidate for any aggressive measures and has developed left lower extremity edema with noted extensive DVT that is proximal.  He has been started on a heparin drip on 8/12.  He is currently awaiting skilled nursing facility placement.   Assessment & Plan:  Principal Problem: Altered mental status Active Problems: Cirrhosis (HCC) AKI (acute kidney injury) ([place]) Dehydration Protein-calorie malnutrition, severe   1. Altered mental status-improved.Afteran extensive discussion with the patient's niece who is his main caregiver, she reportedthat at baseline, he is able to carry on a conversation and knows where he is. She also reportedthat he no longer drives, pays his bills, and at times gets confused when new people come to the house. I suspect that he does have some degree of underlying dementia. It appears that his symptoms have more recently worsened. Thus far work-up has been unrevealing. He does not have any signs of infection. Metabolic work-up has been unrevealing other than dehydration which has since improved with fluids. He cannot undergo MRI due to severe agitation. He did receive benzodiazepines prior to scan, but did not tolerate being in the scanner. B12 checked and is at  lower end of normal with replacement given. Overall, since admission his mental status does appear to be improving.  2. Left lower extremity proximal, symptomatic DVT.  Continue heparin drip for now with need for Lovenox dosing, as DVT is quite extensive and he is symptomatic, on discharge to SNF.  He is a poor candidate for thrombectomy or any other procedures at this point.  This appears to be unprovoked and he will likely require anticoagulation long-term.  No need to further explore other factors at this point given low life expectancy at this point. 3. Acute kidney injury-stable.  Continue on IV fluid as he has poor appetite for now and change to half-normal saline.  Avoid nephrotoxic agents. 4. Hypertension. Blood pressure currently stable. Continue on metoprolol.He is having episodes of tachycardia.   Continue Toprol at 37.5 mg twice daily with improvement in tachycardia noted overall. 5. COPD. ABG did not show any significant CO2 retention. No shortness of breath or wheezing. Continue on Anoro Ellipta. 6. Generalized weakness. Seen by physical therapy recommended skilled nursing facility placement. 7. Urinary retention. Patient had difficulty passing urine. Foley catheter was placed with expulsion of 800 cc of urine. Started on Flomax.   DVT prophylaxis:Heparin drip Code Status:Full code Family Communication: No family members at bedside. Disposition Plan:Needs skilled nursing facility placement on discharge; heparin drip for left lower extremity DVT with ongoing palliative evaluation.  Likely discharge in a.m. if bed available.   Consultants:  Palliative care for goals of care discussion  Procedures:  LLE Doppler US on 8/12  Antimicrobials:   None  Subjective: Patient seen and evaluated today with no new acute complaints or concerns. No  acute concerns or events noted overnight.  He is more alert and oriented and continues to complain of left-sided lower  extremity pain.  Objective: Vitals:   08/18/17 2101 08/19/17 0500 08/19/17 0628 08/19/17 1308  BP: 134/75  (!) 148/98 (!) 153/91  Pulse: (!) 109  98 99  Resp: 18  18 (!) 22  Temp:    98.5 F (36.9 C)  TempSrc:    Oral  SpO2:    100%  Weight:  76.9 kg    Height:        Intake/Output Summary (Last 24 hours) at 08/19/2017 1424 Last data filed at 08/19/2017 1300 Gross per 24 hour  Intake 2436.8 ml  Output 1500 ml  Net 936.8 ml   Filed Weights   08/17/17 0500 08/18/17 0500 08/19/17 0500  Weight: 77.1 kg 77.5 kg 76.9 kg    Examination:  General exam: Appears calm and comfortable  Respiratory system: Clear to auscultation. Respiratory effort normal. Cardiovascular system: S1 & S2 heard, RRR. No JVD, murmurs, rubs, gallops or clicks. No pedal edema. Gastrointestinal system: Abdomen is nondistended, soft and nontender. No organomegaly or masses felt. Normal bowel sounds heard. Central nervous system: Alert and oriented to person and place. Extremities: Left lower extremity swelling with tenderness and phlegmasia noted Skin: No rashes, lesions or ulcers Psychiatry: Difficult to assess.    Data Reviewed: I have personally reviewed following labs and imaging studies  CBC: Recent Labs  Lab 08/14/17 1158 08/15/17 0133 08/19/17 0227  WBC 13.8* 8.3 7.7  HGB 11.7* 11.0* 10.2*  HCT 38.4* 35.6* 33.1*  MCV 93.7 93.2 92.2  PLT 262 256 619   Basic Metabolic Panel: Recent Labs  Lab 08/14/17 1158 08/15/17 0133 08/16/17 0631 08/19/17 0227  NA 138 141 141 145  K 4.4 3.9 4.3 4.2  CL 104 110 111 116*  CO2 26 23 22 22   GLUCOSE 129* 98 119* 100*  BUN 35* 27* 21 26*  CREATININE 1.68* 1.27* 1.14 1.27*  CALCIUM 9.9 9.2 9.3 9.4   GFR: Estimated Creatinine Clearance: 41.1 mL/min (A) (by C-G formula based on SCr of 1.27 mg/dL (H)). Liver Function Tests: Recent Labs  Lab 08/14/17 1657  AST 31  ALT 27  ALKPHOS 48  BILITOT 1.0  PROT 6.8  ALBUMIN 3.0*   No results for  input(s): LIPASE, AMYLASE in the last 168 hours. Recent Labs  Lab 08/14/17 1857  AMMONIA 24   Coagulation Profile: No results for input(s): INR, PROTIME in the last 168 hours. Cardiac Enzymes: Recent Labs  Lab 08/14/17 1657 08/14/17 1857 08/15/17 0133 08/15/17 0811  TROPONINI 0.04* 0.04* 0.04* 0.04*   BNP (last 3 results) No results for input(s): PROBNP in the last 8760 hours. HbA1C: No results for input(s): HGBA1C in the last 72 hours. CBG: No results for input(s): GLUCAP in the last 168 hours. Lipid Profile: No results for input(s): CHOL, HDL, LDLCALC, TRIG, CHOLHDL, LDLDIRECT in the last 72 hours. Thyroid Function Tests: No results for input(s): TSH, T4TOTAL, FREET4, T3FREE, THYROIDAB in the last 72 hours. Anemia Panel: Recent Labs    08/17/17 0551  VITAMINB12 250   Sepsis Labs: No results for input(s): PROCALCITON, LATICACIDVEN in the last 168 hours.  No results found for this or any previous visit (from the past 240 hour(s)).       Radiology Studies: US Venous Img Lower Unilateral Left  Result Date: 08/18/2017 CLINICAL DATA:  84 year old with left lower extremity swelling. EXAM: LEFT LOWER EXTREMITY VENOUS DOPPLER ULTRASOUND TECHNIQUE: Gray-scale sonography  with graded compression, as well as color Doppler and duplex ultrasound were performed to evaluate the lower extremity deep venous systems from the level of the common femoral vein and including the common femoral, femoral, profunda femoral, popliteal and calf veins including the posterior tibial, peroneal and gastrocnemius veins when visible. The superficial great saphenous vein was also interrogated. Spectral Doppler was utilized to evaluate flow at rest and with distal augmentation maneuvers in the common femoral, femoral and popliteal veins. COMPARISON:  06/02/2013 FINDINGS: Contralateral Common Femoral Vein: Patient declined this portion of the examination. Common Femoral Vein: Positive for thrombus.  Echogenic thrombus in the left common femoral vein and no significant flow in the left common femoral vein. Saphenofemoral Junction: Not imaged. Profunda Femoral Vein: Not well visualized but suspect there is thrombus in the profunda femoral vein. Femoral Vein: Positive for thrombus. The femoral vein contains echogenic thrombus and the vein is non compressible. No significant flow within the left femoral vein. Popliteal Vein: Positive for thrombus. Left popliteal vein is expanded with echogenic thrombus. No significant flow within the left popliteal vein. Calf Veins: Positive for thrombus in the gastrocnemius veins. There is blood flow in the posterior tibial veins but these veins are incompletely characterized. Superficial Great Saphenous Vein: No evidence of thrombus. Normal compressibility. Other Findings:  Subcutaneous edema in the left calf. IMPRESSION: Positive for deep venous thrombosis in the left lower extremity. Thrombus extending from the left calf up to the left common femoral vein. Electronically Signed   By: Markus Daft M.D.   On: 08/18/2017 17:42        Scheduled Meds: . calcium-vitamin D  1 tablet Oral Daily  . cholecalciferol  1,000 Units Oral Daily  . ezetimibe  10 mg Oral QHS  . feeding supplement (ENSURE ENLIVE)  237 mL Oral TID BM  . metoprolol succinate  37.5 mg Oral BID  . potassium chloride SA  20 mEq Oral Daily  . tamsulosin  0.4 mg Oral QPC supper  . umeclidinium-vilanterol  1 puff Inhalation Daily   Continuous Infusions: . sodium chloride    . heparin 1,200 Units/hr (08/19/17 0925)     LOS: 1 day    Time spent: 30 minutes    Khailee Mick Darleen Crocker, DO [facility] Pager [phone]  If 7PM-7AM, please contact night-coverage [email or web address] Password [facility] 08/19/2017, 2:24 PM

## 2017-08-20 DIAGNOSIS — I82402 Acute embolism and thrombosis of unspecified deep veins of left lower extremity: Secondary | ICD-10-CM | POA: Diagnosis not present

## 2017-08-20 DIAGNOSIS — R339 Retention of urine, unspecified: Secondary | ICD-10-CM | POA: Diagnosis not present

## 2017-08-20 DIAGNOSIS — J449 Chronic obstructive pulmonary disease, unspecified: Secondary | ICD-10-CM | POA: Diagnosis not present

## 2017-08-20 DIAGNOSIS — I10 Essential (primary) hypertension: Secondary | ICD-10-CM | POA: Diagnosis not present

## 2017-08-27 DIAGNOSIS — Z960 Presence of urogenital implants: Secondary | ICD-10-CM | POA: Diagnosis not present

## 2017-08-27 DIAGNOSIS — Z7901 Long term (current) use of anticoagulants: Secondary | ICD-10-CM | POA: Diagnosis not present

## 2017-08-27 DIAGNOSIS — R339 Retention of urine, unspecified: Secondary | ICD-10-CM | POA: Diagnosis not present

## 2017-08-27 DIAGNOSIS — I82402 Acute embolism and thrombosis of unspecified deep veins of left lower extremity: Secondary | ICD-10-CM | POA: Diagnosis not present

## 2017-09-04 DIAGNOSIS — R339 Retention of urine, unspecified: Secondary | ICD-10-CM | POA: Diagnosis not present

## 2017-09-04 DIAGNOSIS — Z960 Presence of urogenital implants: Secondary | ICD-10-CM | POA: Diagnosis not present

## 2017-09-04 DIAGNOSIS — I82402 Acute embolism and thrombosis of unspecified deep veins of left lower extremity: Secondary | ICD-10-CM | POA: Diagnosis not present

## 2017-09-07 ENCOUNTER — Inpatient Hospital Stay (HOSPITAL_COMMUNITY)
Admission: EM | Admit: 2017-09-07 | Discharge: 2017-09-09 | DRG: 299 | Disposition: A | Discharge status: Hospice | Payer: Medicare Other | Source: Skilled Nursing Facility | Attending: Internal Medicine | Admitting: Internal Medicine

## 2017-09-07 ENCOUNTER — Other Ambulatory Visit: Payer: Self-pay

## 2017-09-07 ENCOUNTER — Emergency Department (HOSPITAL_COMMUNITY): Payer: Medicare Other

## 2017-09-07 ENCOUNTER — Encounter (HOSPITAL_COMMUNITY): Payer: Self-pay | Admitting: *Deleted

## 2017-09-07 DIAGNOSIS — Z8249 Family history of ischemic heart disease and other diseases of the circulatory system: Secondary | ICD-10-CM | POA: Diagnosis not present

## 2017-09-07 DIAGNOSIS — R600 Localized edema: Secondary | ICD-10-CM | POA: Diagnosis not present

## 2017-09-07 DIAGNOSIS — Z6825 Body mass index (BMI) 25.0-25.9, adult: Secondary | ICD-10-CM

## 2017-09-07 DIAGNOSIS — Z86718 Personal history of other venous thrombosis and embolism: Secondary | ICD-10-CM

## 2017-09-07 DIAGNOSIS — M79672 Pain in left foot: Secondary | ICD-10-CM | POA: Diagnosis present

## 2017-09-07 DIAGNOSIS — R821 Myoglobinuria: Secondary | ICD-10-CM | POA: Diagnosis not present

## 2017-09-07 DIAGNOSIS — I80202 Phlebitis and thrombophlebitis of unspecified deep vessels of left lower extremity: Secondary | ICD-10-CM

## 2017-09-07 DIAGNOSIS — Z888 Allergy status to other drugs, medicaments and biological substances status: Secondary | ICD-10-CM

## 2017-09-07 DIAGNOSIS — K7460 Unspecified cirrhosis of liver: Secondary | ICD-10-CM | POA: Diagnosis present

## 2017-09-07 DIAGNOSIS — Z79899 Other long term (current) drug therapy: Secondary | ICD-10-CM

## 2017-09-07 DIAGNOSIS — I999 Unspecified disorder of circulatory system: Secondary | ICD-10-CM

## 2017-09-07 DIAGNOSIS — N179 Acute kidney failure, unspecified: Secondary | ICD-10-CM | POA: Diagnosis present

## 2017-09-07 DIAGNOSIS — R627 Adult failure to thrive: Secondary | ICD-10-CM | POA: Diagnosis present

## 2017-09-07 DIAGNOSIS — Z515 Encounter for palliative care: Secondary | ICD-10-CM | POA: Diagnosis not present

## 2017-09-07 DIAGNOSIS — J449 Chronic obstructive pulmonary disease, unspecified: Secondary | ICD-10-CM | POA: Diagnosis present

## 2017-09-07 DIAGNOSIS — F0390 Unspecified dementia without behavioral disturbance: Secondary | ICD-10-CM | POA: Diagnosis not present

## 2017-09-07 DIAGNOSIS — I82492 Acute embolism and thrombosis of other specified deep vein of left lower extremity: Secondary | ICD-10-CM | POA: Diagnosis not present

## 2017-09-07 DIAGNOSIS — M7989 Other specified soft tissue disorders: Secondary | ICD-10-CM | POA: Diagnosis not present

## 2017-09-07 DIAGNOSIS — Z66 Do not resuscitate: Secondary | ICD-10-CM | POA: Diagnosis not present

## 2017-09-07 DIAGNOSIS — I739 Peripheral vascular disease, unspecified: Principal | ICD-10-CM | POA: Diagnosis present

## 2017-09-07 DIAGNOSIS — Z885 Allergy status to narcotic agent status: Secondary | ICD-10-CM | POA: Diagnosis not present

## 2017-09-07 DIAGNOSIS — Z7189 Other specified counseling: Secondary | ICD-10-CM | POA: Diagnosis not present

## 2017-09-07 DIAGNOSIS — I998 Other disorder of circulatory system: Secondary | ICD-10-CM | POA: Diagnosis not present

## 2017-09-07 DIAGNOSIS — K219 Gastro-esophageal reflux disease without esophagitis: Secondary | ICD-10-CM | POA: Diagnosis present

## 2017-09-07 DIAGNOSIS — I82402 Acute embolism and thrombosis of unspecified deep veins of left lower extremity: Secondary | ICD-10-CM | POA: Diagnosis not present

## 2017-09-07 DIAGNOSIS — I10 Essential (primary) hypertension: Secondary | ICD-10-CM | POA: Diagnosis not present

## 2017-09-07 DIAGNOSIS — I70208 Unspecified atherosclerosis of native arteries of extremities, other extremity: Secondary | ICD-10-CM | POA: Diagnosis not present

## 2017-09-07 DIAGNOSIS — Z87891 Personal history of nicotine dependence: Secondary | ICD-10-CM

## 2017-09-07 DIAGNOSIS — E43 Unspecified severe protein-calorie malnutrition: Secondary | ICD-10-CM | POA: Diagnosis not present

## 2017-09-07 DIAGNOSIS — R339 Retention of urine, unspecified: Secondary | ICD-10-CM | POA: Diagnosis present

## 2017-09-07 DIAGNOSIS — I724 Aneurysm of artery of lower extremity: Secondary | ICD-10-CM | POA: Diagnosis present

## 2017-09-07 DIAGNOSIS — R52 Pain, unspecified: Secondary | ICD-10-CM | POA: Diagnosis not present

## 2017-09-07 DIAGNOSIS — H9190 Unspecified hearing loss, unspecified ear: Secondary | ICD-10-CM | POA: Diagnosis not present

## 2017-09-07 DIAGNOSIS — N281 Cyst of kidney, acquired: Secondary | ICD-10-CM | POA: Diagnosis not present

## 2017-09-07 DIAGNOSIS — R609 Edema, unspecified: Secondary | ICD-10-CM | POA: Diagnosis not present

## 2017-09-07 MED ORDER — HEPARIN (PORCINE) IN NACL 100-0.45 UNIT/ML-% IJ SOLN
INTRAMUSCULAR | Status: AC
  Filled 2017-09-07: qty 250

## 2017-09-07 MED ORDER — HEPARIN (PORCINE) IN NACL 100-0.45 UNIT/ML-% IJ SOLN
1100.0000 [IU]/h | INTRAMUSCULAR | Status: DC
  Administered 2017-09-07: 1100 [IU]/h via INTRAVENOUS
  Filled 2017-09-07: qty 250

## 2017-09-07 NOTE — Progress Notes (Signed)
ANTICOAGULATION CONSULT NOTE - Preliminary  Pharmacy Consult for heparin Indication: DVT  Allergies  Allergen Reactions  . Acetaminophen   . Codeine Other (See Comments)    Unknown reaction  . Ibuprofen     MD took patient off as he had a blood clot in his leg    Patient Measurements: Height: 5\' 8"  (172.7 cm) Weight: 169 lb 8.5 oz (76.9 kg) IBW/kg (Calculated) : 68.4 HEPARIN DW (KG): 76.9   Vital Signs: Temp: 98.6 F (37 C) (09/01 2314) Temp Source: Oral (09/01 2314) BP: 150/94 (09/01 2314) Pulse Rate: 95 (09/01 2314)  Labs: No results for input(s): HGB, HCT, PLT, APTT, LABPROT, INR, HEPARINUNFRC, CREATININE, CKTOTAL, CKMB, TROPONINI in the last 72 hours. Estimated Creatinine Clearance: 41.1 mL/min (A) (by C-G formula based on SCr of 1.27 mg/dL (H)).  Medical History: Past Medical History:  Diagnosis Date  . Arthritis   . COPD (chronic obstructive pulmonary disease) ([place])   . GERD (gastroesophageal reflux disease)   . HOH (hard of hearing)   . Hypertension   . Shortness of breath    exertion    Medications:  Scheduled:   Infusions:  . heparin    . [START ON 09/08/2017] heparin      Assessment: 84 yo male with history of DVT in LLE brought to ED for discoloration, pain and coldness of left foot.  Foot has no pulses.  Has been receiving 1mg /kg lovenox Q12 hours, last dose reported to be at 1800 tonight.  Spoke with Dr. Wyvonnia Dusky about whether to start heparin now vs waiting since received therapeutic dose at 1800 and should be anticoagulated.  He wanted to start now.  Will not give bolus, start at 14 units/kg/hr = 1100 units/hr.    Goal of Therapy:  Heparin level 0.3-0.7 units/ml   Plan:  No bolus, start heparin infusion at 1100 units/hr Check anti-Xa level in 8 hours and daily while on heparin Monitor H&H and platelets  Adel Neyer, Magdalene Molly, [place] 09/07/2017,11:36 PM

## 2017-09-07 NOTE — ED Triage Notes (Signed)
Pt brought in by [facility] for c/o discoloration, no pulse and edema to left lower leg and foot; pt is from [place] and the staff reported that pt had some swelling to his left foot last night with a pedal pulse; nurse reports tonight pt has increased swelling with no pulse to left foot; foot is purple in color and no pulse is found

## 2017-09-07 NOTE — ED Provider Notes (Signed)
[facility] EMERGENCY DEPARTMENT Provider Note   CSN: [id number] Arrival date & time: 09/07/17  2305     History   Chief Complaint Chief Complaint  Patient presents with  . Leg Swelling    HPI Kurt Burke is a 84 y.o. male.  Patient sent from ECF with discoloration, pain, and coldness to L foot, onset tonight. No pulses noted. ECF staff reports LLE pulses were present 24 hours ago. Increased pain and swelling tonight. Denies trauma. Hx known DVT in LLE and has been receiving lovenox. Denies chest pain, back pain, abdominal pain, SOB. Hx PAD as well. Reportedly has been compliant with lovenox injections.  The history is provided by the EMS personnel, the patient and the nursing home. The history is limited by the condition of the patient.    Past Medical History:  Diagnosis Date  . Arthritis   . COPD (chronic obstructive pulmonary disease) ([place])   . GERD (gastroesophageal reflux disease)   . HOH (hard of hearing)   . Hypertension   . Shortness of breath    exertion    Patient Active Problem List   Diagnosis Date Noted  . DNR (do not resuscitate) discussion   . Goals of care, counseling/discussion   . Palliative care by specialist   . Protein-calorie malnutrition, severe 08/16/2017  . COPD (chronic obstructive pulmonary disease) ([place])   . SOB (shortness of breath) 02/26/2015  . AKI (acute kidney injury) ([place]) 02/26/2015  . FTT (failure to thrive) in adult 02/26/2015  . Weakness 02/26/2015  . Dehydration 02/26/2015  . Altered mental status 06/16/2012  . URI (upper respiratory infection) 06/16/2012  . Fever 06/16/2012  . Rectal bleeding 09/26/2011  . Rotator cuff insufficiency 09/23/2011  . GERD (gastroesophageal reflux disease) 10/23/2010  . Cirrhosis ([place]) 10/23/2010  . SHOULDER PAIN 10/17/2008  . IMPINGEMENT SYNDROME 10/17/2008  . RUPTURE ROTATOR CUFF 10/17/2008    Past Surgical History:  Procedure Laterality Date  . COLONOSCOPY  10/07/2011   Procedure: COLONOSCOPY;  Surgeon: Rogene [place], MD;  Location: [facility];  Service: Endoscopy;  Laterality: N/A;  730  . CYSTOSCOPY  2000   Preformed to remove cyst from liver at [place] Medications    Prior to Admission medications   Medication Sig Start Date End Date Taking? Authorizing Provider  Calcium Carbonate-Vitamin D (CALCIUM 500 + D) 500-125 MG-UNIT TABS Take 1 tablet by mouth daily.    [provider]  carbamide peroxide (DEBROX) 6.5 % otic solution Place 5 drops into both ears 2 (two) times daily as needed (ear wax removal).     [provider]  cholecalciferol (VITAMIN D) 1000 units tablet Take 1,000 Units by mouth daily.    [provider]  dexlansoprazole (DEXILANT) 60 MG capsule Take 60 mg by mouth daily.    [provider]  enoxaparin (LOVENOX) 80 MG/0.8ML injection Inject 0.75 mLs (75 mg total) into the skin 2 (two) times daily. 08/19/17 08/19/18  [name], Pratik D, DO  feeding supplement, ENSURE ENLIVE, (ENSURE ENLIVE) LIQD Take 237 mLs by mouth 3 (three) times daily between meals. 08/19/17   [name], Pratik D, DO  levalbuterol (XOPENEX) 0.63 MG/3ML nebulizer solution Take 3 mLs (0.63 mg total) by nebulization every 8 (eight) hours as needed for wheezing or shortness of breath. 02/27/15   Kelvin Cellar, MD  meclizine (ANTIVERT) 25 MG tablet Take 25 mg by mouth  4 (four) times daily as needed for dizziness.     [provider]  metoprolol succinate (TOPROL-XL) 25 MG 24 hr tablet Take 1.5 tablets (37.5 mg total) by mouth 2 (two) times daily. 08/19/17 09/16/2017  [name], Pratik D, DO  Multiple Vitamin (MULTI-DAY VITAMINS PO) Take 1 tablet by mouth daily.    [provider]  tamsulosin (FLOMAX) 0.4 MG CAPS capsule Take 1 capsule (0.4 mg total) by mouth daily after supper. 08/19/17 09/10/2017  [name], Pratik D, DO  umeclidinium-vilanterol (ANORO ELLIPTA) 62.5-25 MCG/INH AEPB Inhale 1 puff  into the lungs daily.    [provider]  ZETIA 10 MG tablet Take 10 mg by mouth daily. 08/26/11   [provider]    Family History Family History  Problem Relation Age of Onset  . Heart disease Mother   . Heart disease Father   . Healthy Sister   . Arthritis Unknown     Social History Social History   Tobacco Use  . Smoking status: Former Smoker    Types: Cigarettes    Last attempt to quit: 10/22/1990    Years since quitting: 26.8  . Smokeless tobacco: Never Used  Substance Use Topics  . Alcohol use: No    Alcohol/week: 0.0 standard drinks  . Drug use: No     Allergies   Acetaminophen; Codeine; and Ibuprofen   Review of Systems Review of Systems  Constitutional: Negative for activity change, appetite change and fever.  HENT: Negative for congestion.   Respiratory: Negative for cough, chest tightness and shortness of breath.   Cardiovascular: Negative for chest pain.  Gastrointestinal: Negative for abdominal pain, nausea and vomiting.  Genitourinary: Negative for dysuria and hematuria.  Musculoskeletal: Positive for arthralgias and myalgias.  Neurological: Negative for dizziness, syncope, numbness and headaches.   all other systems are negative except as noted in the HPI and PMH.     Physical Exam Updated Vital Signs BP (!) 150/94 (BP Location: Left Arm)   Pulse 95   Temp 98.6 F (37 C) (Oral)   Resp 20   Ht 5\' 8"  (1.727 m)   Wt 76.9 kg   SpO2 96%   BMI 25.78 kg/m   Physical Exam  Constitutional: He is oriented to person, place, and time. He appears well-developed and well-nourished. No distress.  HENT:  Head: Normocephalic and atraumatic.  Mouth/Throat: Oropharynx is clear and moist. No oropharyngeal exudate.  Eyes: Pupils are equal, round, and reactive to light. Conjunctivae and EOM are normal.  Neck: Normal range of motion. Neck supple.  No meningismus.  Cardiovascular: Normal rate, regular rhythm, normal heart sounds and intact  distal pulses.  No murmur heard. Pulmonary/Chest: Effort normal and breath sounds normal. No respiratory distress.  Abdominal: Soft. There is no tenderness. There is no rebound and no guarding.  Musculoskeletal: Normal range of motion. He exhibits edema and tenderness.  LLE held externally rotated.diffuse echymosis and edema to L foot, worse on dorsum. Cool. Unable to palpate or doppler PT or DP pulse. Intact DP pulse on R. Intact femoral pulses bilaterally Able to wiggle toes bilaterally  Neurological: He is alert and oriented to person, place, and time. No cranial nerve deficit. He exhibits normal muscle tone. Coordination normal.  No ataxia on finger to nose bilaterally. No pronator drift. 5/5 strength throughout. CN 2-12 intact.Equal grip strength. Sensation intact.   Skin: Skin is warm. Capillary refill takes more than 3 seconds.  Psychiatric: He has a normal mood and affect. His behavior is  normal.  Nursing note and vitals reviewed.    ED Treatments / Results  Labs (all labs ordered are listed, but only abnormal results are displayed) Labs Reviewed  CBC WITH DIFFERENTIAL/PLATELET - Abnormal; Notable for the following components:      Result Value   RBC 3.80 (*)    Hemoglobin 10.8 (*)    HCT 35.1 (*)    RDW 15.8 (*)    All other components within normal limits  PROTIME-INR - Abnormal; Notable for the following components:   Prothrombin Time 16.3 (*)    All other components within normal limits  BASIC METABOLIC PANEL - Abnormal; Notable for the following components:   Glucose, Bld 141 (*)    BUN 50 (*)    Creatinine, Ser 3.16 (*)    GFR calc non Af Amer 17 (*)    GFR calc Af Amer 19 (*)    All other components within normal limits  APTT - Abnormal; Notable for the following components:   aPTT 67 (*)    All other components within normal limits    EKG None  Radiology Dg Foot Complete Left  Result Date: 09/08/2017 CLINICAL DATA:  84 year old male with left foot  swelling and ecchymosis. EXAM: LEFT FOOT - COMPLETE 3+ VIEW COMPARISON:  None. FINDINGS: There is no acute fracture or dislocation. The bones are osteopenic. There is diffuse soft tissue edema of the distal leg and foot. Vascular calcification noted. IMPRESSION: 1. No acute fracture or dislocation. 2. Diffuse soft tissue edema, likely cellulitis. No radiopaque foreign object or soft tissue air. Electronically Signed   By: Anner Crete M.D.   On: 09/08/2017 00:27   Dg Hip Unilat W Or Wo Pelvis 2-3 Views Left  Result Date: 09/08/2017 CLINICAL DATA:  Swelling and ecchymosis of the foot. EXAM: DG HIP (WITH OR WITHOUT PELVIS) 2-3V LEFT COMPARISON:  None. FINDINGS: There is lower lumbar degenerative disc and facet arthropathy of the included lumbar spine from L3 through S1. No pelvic diastasis or fracture. Osteoarthritis of both hip joints. Slightly aspherical appearance of both femoral heads which likely reflects a component of fibrosis TAVR impingement morphology. Soft tissue induration is seen along the lateral aspect left hip. IMPRESSION: 1. Soft tissue induration along the lateral aspect of the left hip and thigh suspicious for possible soft tissue contusion or possibly some mild third spacing of fluid. 2. Lower lumbar degenerative disc and facet arthropathy. 3. No acute pelvic or hip fracture. Electronically Signed   By: Ashley Royalty M.D.   On: 09/08/2017 00:30    Procedures Procedures (including critical care time)  Medications Ordered in ED Medications - No data to display   Initial Impression / Assessment and Plan / ED Course  I have reviewed the triage vital signs and the nursing notes.  Pertinent labs & imaging results that were available during my care of the patient were reviewed by me and considered in my medical decision making (see chart for details).    Known DVT with worsening foot swelling, pain, ecchymosis. Unable to doppler pulses. 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  D/w  Dr. Oneida Alar on arrival. Agrees with heparin gtt and he will see in the ED. Does not recommend CTA at this time. Patient DNR and apparently bedbound by report.  Dr. Oneida Alar will [facility] in [facility].  EMS called for transport.  IV heparin drip started.  Discussed with [place].  Patient did receive Lovenox dose around 6 PM.  No heparin bolus recommended.  Labs are pending.  Niece is updated at bedside.  Patient declines pain medication.  Labs resulted. Cr up to 3 which is worse than discharge several weeks ago. Patient may not be able to tolerate any angio procedures.  Transfer d/w Dr. Christy Gentles who accepts. Dr. Oneida Alar to see in the ED. Patient left with EMS for [facility] ED.   CRITICAL CARE Performed by: Ezequiel Essex Total critical care time: 32 minutes Critical care time was exclusive of separately billable procedures and treating other patients. Critical care was necessary to treat or prevent imminent or life-threatening deterioration. Critical care was time spent personally by me on the following activities: development of treatment plan with patient and/or surrogate as well as nursing, discussions with consultants, evaluation of patient's response to treatment, examination of patient, obtaining history from patient or surrogate, ordering and performing treatments and interventions, ordering and review of laboratory studies, ordering and review of radiographic studies, pulse oximetry and re-evaluation of patient's condition.   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

## 2017-09-08 ENCOUNTER — Other Ambulatory Visit: Payer: Self-pay

## 2017-09-08 ENCOUNTER — Emergency Department (HOSPITAL_COMMUNITY): Payer: Medicare Other

## 2017-09-08 ENCOUNTER — Encounter (HOSPITAL_COMMUNITY): Payer: Self-pay | Admitting: Emergency Medicine

## 2017-09-08 DIAGNOSIS — Z87891 Personal history of nicotine dependence: Secondary | ICD-10-CM | POA: Diagnosis not present

## 2017-09-08 DIAGNOSIS — Z7189 Other specified counseling: Secondary | ICD-10-CM

## 2017-09-08 DIAGNOSIS — K7460 Unspecified cirrhosis of liver: Secondary | ICD-10-CM | POA: Diagnosis not present

## 2017-09-08 DIAGNOSIS — Z66 Do not resuscitate: Secondary | ICD-10-CM | POA: Diagnosis present

## 2017-09-08 DIAGNOSIS — Z6825 Body mass index (BMI) 25.0-25.9, adult: Secondary | ICD-10-CM | POA: Diagnosis not present

## 2017-09-08 DIAGNOSIS — M6281 Muscle weakness (generalized): Secondary | ICD-10-CM | POA: Diagnosis not present

## 2017-09-08 DIAGNOSIS — M79672 Pain in left foot: Secondary | ICD-10-CM | POA: Diagnosis not present

## 2017-09-08 DIAGNOSIS — I999 Unspecified disorder of circulatory system: Secondary | ICD-10-CM

## 2017-09-08 DIAGNOSIS — F0390 Unspecified dementia without behavioral disturbance: Secondary | ICD-10-CM | POA: Diagnosis present

## 2017-09-08 DIAGNOSIS — N281 Cyst of kidney, acquired: Secondary | ICD-10-CM | POA: Diagnosis not present

## 2017-09-08 DIAGNOSIS — Z515 Encounter for palliative care: Secondary | ICD-10-CM

## 2017-09-08 DIAGNOSIS — I82502 Chronic embolism and thrombosis of unspecified deep veins of left lower extremity: Secondary | ICD-10-CM | POA: Diagnosis not present

## 2017-09-08 DIAGNOSIS — Z79899 Other long term (current) drug therapy: Secondary | ICD-10-CM | POA: Diagnosis not present

## 2017-09-08 DIAGNOSIS — I998 Other disorder of circulatory system: Secondary | ICD-10-CM | POA: Diagnosis not present

## 2017-09-08 DIAGNOSIS — Z888 Allergy status to other drugs, medicaments and biological substances status: Secondary | ICD-10-CM | POA: Diagnosis not present

## 2017-09-08 DIAGNOSIS — I10 Essential (primary) hypertension: Secondary | ICD-10-CM | POA: Diagnosis not present

## 2017-09-08 DIAGNOSIS — N179 Acute kidney failure, unspecified: Secondary | ICD-10-CM | POA: Diagnosis not present

## 2017-09-08 DIAGNOSIS — I724 Aneurysm of artery of lower extremity: Secondary | ICD-10-CM | POA: Diagnosis present

## 2017-09-08 DIAGNOSIS — K219 Gastro-esophageal reflux disease without esophagitis: Secondary | ICD-10-CM | POA: Diagnosis present

## 2017-09-08 DIAGNOSIS — M2550 Pain in unspecified joint: Secondary | ICD-10-CM | POA: Diagnosis not present

## 2017-09-08 DIAGNOSIS — R821 Myoglobinuria: Secondary | ICD-10-CM | POA: Diagnosis present

## 2017-09-08 DIAGNOSIS — Z8249 Family history of ischemic heart disease and other diseases of the circulatory system: Secondary | ICD-10-CM | POA: Diagnosis not present

## 2017-09-08 DIAGNOSIS — R339 Retention of urine, unspecified: Secondary | ICD-10-CM | POA: Diagnosis present

## 2017-09-08 DIAGNOSIS — I82402 Acute embolism and thrombosis of unspecified deep veins of left lower extremity: Secondary | ICD-10-CM | POA: Diagnosis present

## 2017-09-08 DIAGNOSIS — E43 Unspecified severe protein-calorie malnutrition: Secondary | ICD-10-CM | POA: Diagnosis not present

## 2017-09-08 DIAGNOSIS — Z885 Allergy status to narcotic agent status: Secondary | ICD-10-CM | POA: Diagnosis not present

## 2017-09-08 DIAGNOSIS — J449 Chronic obstructive pulmonary disease, unspecified: Secondary | ICD-10-CM | POA: Diagnosis not present

## 2017-09-08 DIAGNOSIS — R600 Localized edema: Secondary | ICD-10-CM | POA: Diagnosis not present

## 2017-09-08 DIAGNOSIS — I739 Peripheral vascular disease, unspecified: Secondary | ICD-10-CM | POA: Diagnosis present

## 2017-09-08 DIAGNOSIS — Z7401 Bed confinement status: Secondary | ICD-10-CM | POA: Diagnosis not present

## 2017-09-08 DIAGNOSIS — Z86718 Personal history of other venous thrombosis and embolism: Secondary | ICD-10-CM | POA: Diagnosis not present

## 2017-09-08 DIAGNOSIS — E860 Dehydration: Secondary | ICD-10-CM | POA: Diagnosis not present

## 2017-09-08 DIAGNOSIS — R1312 Dysphagia, oropharyngeal phase: Secondary | ICD-10-CM | POA: Diagnosis not present

## 2017-09-08 DIAGNOSIS — I82492 Acute embolism and thrombosis of other specified deep vein of left lower extremity: Secondary | ICD-10-CM | POA: Diagnosis not present

## 2017-09-08 DIAGNOSIS — R627 Adult failure to thrive: Secondary | ICD-10-CM | POA: Diagnosis not present

## 2017-09-08 DIAGNOSIS — I70208 Unspecified atherosclerosis of native arteries of extremities, other extremity: Secondary | ICD-10-CM | POA: Diagnosis present

## 2017-09-08 DIAGNOSIS — H9190 Unspecified hearing loss, unspecified ear: Secondary | ICD-10-CM | POA: Diagnosis present

## 2017-09-08 LAB — CBC WITH DIFFERENTIAL/PLATELET
Basophils Absolute: 0 10*3/uL (ref 0.0–0.1)
Basophils Relative: 0 %
EOS ABS: 0 10*3/uL (ref 0.0–0.7)
Eosinophils Relative: 0 %
HCT: 35.1 % — ABNORMAL LOW (ref 39.0–52.0)
Hemoglobin: 10.8 g/dL — ABNORMAL LOW (ref 13.0–17.0)
LYMPHS ABS: 0.9 10*3/uL (ref 0.7–4.0)
LYMPHS PCT: 10 %
MCH: 28.4 pg (ref 26.0–34.0)
MCHC: 30.8 g/dL (ref 30.0–36.0)
MCV: 92.4 fL (ref 78.0–100.0)
MONOS PCT: 7 %
Monocytes Absolute: 0.6 10*3/uL (ref 0.1–1.0)
NEUTROS PCT: 83 %
Neutro Abs: 7.5 10*3/uL (ref 1.7–7.7)
PLATELETS: 380 10*3/uL (ref 150–400)
RBC: 3.8 MIL/uL — ABNORMAL LOW (ref 4.22–5.81)
RDW: 15.8 % — ABNORMAL HIGH (ref 11.5–15.5)
WBC: 9 10*3/uL (ref 4.0–10.5)

## 2017-09-08 LAB — BASIC METABOLIC PANEL
ANION GAP: 8 (ref 5–15)
Anion gap: 9 (ref 5–15)
BUN: 45 mg/dL — ABNORMAL HIGH (ref 8–23)
BUN: 50 mg/dL — ABNORMAL HIGH (ref 8–23)
CHLORIDE: 105 mmol/L (ref 98–111)
CO2: 25 mmol/L (ref 22–32)
CO2: 24 mmol/L (ref 22–32)
Calcium: 9.7 mg/dL (ref 8.9–10.3)
Calcium: 9.8 mg/dL (ref 8.9–10.3)
Chloride: 107 mmol/L (ref 98–111)
Creatinine, Ser: 2.92 mg/dL — ABNORMAL HIGH (ref 0.61–1.24)
Creatinine, Ser: 3.16 mg/dL — ABNORMAL HIGH (ref 0.61–1.24)
GFR calc Af Amer: 19 mL/min — ABNORMAL LOW (ref >60)
GFR calc Af Amer: 21 mL/min — ABNORMAL LOW (ref >60)
GFR calc non Af Amer: 17 mL/min — ABNORMAL LOW (ref >60)
GFR calc non Af Amer: 18 mL/min — ABNORMAL LOW (ref >60)
GLUCOSE: 141 mg/dL — AB (ref 70–99)
Glucose, Bld: 114 mg/dL — ABNORMAL HIGH (ref 70–99)
POTASSIUM: 4.5 mmol/L (ref 3.5–5.1)
POTASSIUM: 4.2 mmol/L (ref 3.5–5.1)
Sodium: 138 mmol/L (ref 135–145)
Sodium: 140 mmol/L (ref 135–145)

## 2017-09-08 LAB — PROTIME-INR
INR: 1.32
PROTHROMBIN TIME: 16.3 s — AB (ref 11.4–15.2)

## 2017-09-08 LAB — APTT: aPTT: 67 seconds — ABNORMAL HIGH (ref 24–36)

## 2017-09-08 LAB — MRSA PCR SCREENING: MRSA by PCR: NEGATIVE

## 2017-09-08 LAB — HEPARIN LEVEL (UNFRACTIONATED): Heparin Unfractionated: 1.8 IU/mL — ABNORMAL HIGH (ref 0.30–0.70)

## 2017-09-08 MED ORDER — MIRTAZAPINE 15 MG PO TABS
7.5000 mg | ORAL_TABLET | Freq: Every day | ORAL | Status: DC
  Administered 2017-09-08: 7.5 mg via ORAL
  Filled 2017-09-08: qty 1

## 2017-09-08 MED ORDER — ACETAMINOPHEN 325 MG PO TABS
650.0000 mg | ORAL_TABLET | Freq: Three times a day (TID) | ORAL | Status: DC
  Administered 2017-09-08 – 2017-09-09 (×3): 650 mg via ORAL
  Filled 2017-09-08 (×3): qty 2

## 2017-09-08 MED ORDER — EZETIMIBE 10 MG PO TABS
10.0000 mg | ORAL_TABLET | Freq: Every day | ORAL | Status: DC
  Administered 2017-09-08 – 2017-09-09 (×2): 10 mg via ORAL
  Filled 2017-09-08 (×2): qty 1

## 2017-09-08 MED ORDER — TAMSULOSIN HCL 0.4 MG PO CAPS
0.4000 mg | ORAL_CAPSULE | Freq: Every day | ORAL | Status: DC
  Filled 2017-09-08: qty 1

## 2017-09-08 MED ORDER — ONDANSETRON HCL 4 MG PO TABS: 4.0000 mg | ORAL_TABLET | Freq: Four times a day (QID) | ORAL | Status: DC | PRN

## 2017-09-08 MED ORDER — ACETAMINOPHEN 325 MG PO TABS
650.0000 mg | ORAL_TABLET | Freq: Four times a day (QID) | ORAL | Status: DC | PRN
  Administered 2017-09-08: 650 mg via ORAL

## 2017-09-08 MED ORDER — OXYCODONE HCL 5 MG PO TABS
5.0000 mg | ORAL_TABLET | Freq: Four times a day (QID) | ORAL | Status: DC | PRN
  Administered 2017-09-08 – 2017-09-09 (×3): 5 mg via ORAL
  Filled 2017-09-08 (×3): qty 1

## 2017-09-08 MED ORDER — PANTOPRAZOLE SODIUM 40 MG PO TBEC
40.0000 mg | DELAYED_RELEASE_TABLET | Freq: Every day | ORAL | Status: DC
  Administered 2017-09-08 – 2017-09-09 (×2): 40 mg via ORAL
  Filled 2017-09-08 (×2): qty 1

## 2017-09-08 MED ORDER — ACETAMINOPHEN 325 MG PO TABS
650.0000 mg | ORAL_TABLET | Freq: Four times a day (QID) | ORAL | Status: DC | PRN
  Filled 2017-09-08: qty 2

## 2017-09-08 MED ORDER — HEPARIN (PORCINE) IN NACL 100-0.45 UNIT/ML-% IJ SOLN
1000.0000 [IU]/h | INTRAMUSCULAR | Status: DC
  Filled 2017-09-08: qty 250

## 2017-09-08 MED ORDER — ONDANSETRON HCL 4 MG/2ML IJ SOLN: 4.0000 mg | Freq: Four times a day (QID) | INTRAMUSCULAR | Status: DC | PRN

## 2017-09-08 MED ORDER — SODIUM CHLORIDE 0.9 % IV SOLN
INTRAVENOUS | Status: DC
  Administered 2017-09-08: 11:00:00 via INTRAVENOUS

## 2017-09-08 MED ORDER — ENSURE ENLIVE PO LIQD
237.0000 mL | Freq: Three times a day (TID) | ORAL | Status: DC
  Administered 2017-09-08 – 2017-09-09 (×4): 237 mL via ORAL

## 2017-09-08 MED ORDER — METOPROLOL SUCCINATE ER 25 MG PO TB24
37.5000 mg | ORAL_TABLET | Freq: Two times a day (BID) | ORAL | Status: DC
  Administered 2017-09-08 – 2017-09-09 (×3): 37.5 mg via ORAL
  Filled 2017-09-08 (×3): qty 2

## 2017-09-08 MED ORDER — LEVALBUTEROL HCL 0.63 MG/3ML IN NEBU: 0.6300 mg | INHALATION_SOLUTION | Freq: Three times a day (TID) | RESPIRATORY_TRACT | Status: DC | PRN

## 2017-09-08 MED ORDER — ACETAMINOPHEN 650 MG RE SUPP: 650.0000 mg | Freq: Four times a day (QID) | RECTAL | Status: DC | PRN

## 2017-09-08 MED ORDER — UMECLIDINIUM-VILANTEROL 62.5-25 MCG/INH IN AEPB
1.0000 | INHALATION_SPRAY | Freq: Every day | RESPIRATORY_TRACT | Status: DC
  Administered 2017-09-09: 1 via RESPIRATORY_TRACT
  Filled 2017-09-08: qty 14

## 2017-09-08 NOTE — ED Notes (Signed)
Pt arrived via [facility] from [place] for vascular surgeon consult.  Pt alert, only c/o pain with palpation.  118/78, 92, 18, 97% Hepain infusing at 1100 un/hr

## 2017-09-08 NOTE — Consult Note (Signed)
Referring Physician: [facility]  Patient name: Kurt Burke MRN: [id number] DOB: Nov 10, 1929 Sex: male  REASON FOR CONSULT: left foot ischemia  HPI: Kurt Burke is a 83 y.o. male, with moderate to severe dementia.  Patient was found to have a DVT in his left leg and started on lovenox about 2 weeks ago.  He was sent to ER tonight by his SNF with concerns over dusky left foot.  His family is at bedside and states that he was walking until just a few days ago.  He intermittently complains of some pain in the left foot.  He has known peripheral arterial disease and was seen by my partner Dr Donnetta Hutching in July of this year.  He had minimal symptoms at that time and was thought to be a marginal candidate for revascularization.  In addition to his dementia he has a history of at least some component of cirrhosis.  He currently has acute worsening of renal failure with creatinine of 3 today.   Past Medical History:  Diagnosis Date  . Arthritis   . COPD (chronic obstructive pulmonary disease) ([place])   . GERD (gastroesophageal reflux disease)   . HOH (hard of hearing)   . Hypertension   . Shortness of breath    exertion   Past Surgical History:  Procedure Laterality Date  . COLONOSCOPY  10/07/2011   Procedure: COLONOSCOPY;  Surgeon: Rogene Houston, MD;  Location: [facility];  Service: Endoscopy;  Laterality: N/A;  730  . CYSTOSCOPY  2000   Preformed to remove cyst from liver at [place]    Family History  Problem Relation Age of Onset  . Heart disease Mother   . Heart disease Father   . Healthy Sister   . Arthritis Unknown     SOCIAL HISTORY: Social History   Socioeconomic History  . Marital status: Single    Spouse name: Not on file  . Number of children: Not on file  . Years of education: 81  . Highest education level: Not on file  Occupational History  . Not on file  Social Needs  . Financial resource strain: Not on file  .  Food insecurity:    Worry: Not on file    Inability: Not on file  . Transportation needs:    Medical: Not on file    Non-medical: Not on file  Tobacco Use  . Smoking status: Former Smoker    Types: Cigarettes    Last attempt to quit: 10/22/1990    Years since quitting: 26.8  . Smokeless tobacco: Never Used  Substance and Sexual Activity  . Alcohol use: No    Alcohol/week: 0.0 standard drinks  . Drug use: No  . Sexual activity: Not on file  Lifestyle  . Physical activity:    Days per week: Not on file    Minutes per session: Not on file  . Stress: Not on file  Relationships  . Social connections:    Talks on phone: Not on file    Gets together: Not on file    Attends religious service: Not on file    Active member of club or organization: Not on file    Attends meetings of clubs or organizations: Not on file    Relationship status: Not on file  . Intimate partner violence:    Fear of current or ex partner: Not on file    Emotionally  abused: Not on file    Physically abused: Not on file    Forced sexual activity: Not on file  Other Topics Concern  . Not on file  Social History Narrative  . Not on file    Allergies  Allergen Reactions  . Acetaminophen   . Codeine Other (See Comments)    Unknown reaction  . Ibuprofen     MD took patient off as he had a blood clot in his leg    Current Facility-Administered Medications  Medication Dose Route Frequency Provider Last Rate Last Dose  . heparin ADULT infusion 100 units/mL (25000 units/270mL sodium chloride 0.45%)  1,100 Units/hr Intravenous Continuous Rancour, Stephen, MD 11 mL/hr at 09/08/17 0055 1,100 Units/hr at 09/08/17 0055   Current Outpatient Medications  Medication Sig Dispense Refill  . Calcium Carbonate-Vitamin D (CALCIUM 500 + D) 500-125 MG-UNIT TABS Take 1 tablet by mouth daily.    . carbamide peroxide (DEBROX) 6.5 % otic solution Place 5 drops into both ears 2 (two) times daily as needed (ear wax removal).      . cholecalciferol (VITAMIN D) 1000 units tablet Take 1,000 Units by mouth daily.    Marland Kitchen dexlansoprazole (DEXILANT) 60 MG capsule Take 60 mg by mouth daily.    Marland Kitchen enoxaparin (LOVENOX) 80 MG/0.8ML injection Inject 0.75 mLs (75 mg total) into the skin 2 (two) times daily. 22.4 Syringe 0  . feeding supplement, ENSURE ENLIVE, (ENSURE ENLIVE) LIQD Take 237 mLs by mouth 3 (three) times daily between meals. 237 mL 12  . levalbuterol (XOPENEX) 0.63 MG/3ML nebulizer solution Take 3 mLs (0.63 mg total) by nebulization every 8 (eight) hours as needed for wheezing or shortness of breath. 3 mL 12  . meclizine (ANTIVERT) 25 MG tablet Take 25 mg by mouth 4 (four) times daily as needed for dizziness.     . metoprolol succinate (TOPROL-XL) 25 MG 24 hr tablet Take 1.5 tablets (37.5 mg total) by mouth 2 (two) times daily. 90 tablet 0  . Multiple Vitamin (MULTI-DAY VITAMINS PO) Take 1 tablet by mouth daily.    . tamsulosin (FLOMAX) 0.4 MG CAPS capsule Take 1 capsule (0.4 mg total) by mouth daily after supper. 30 capsule 0  . umeclidinium-vilanterol (ANORO ELLIPTA) 62.5-25 MCG/INH AEPB Inhale 1 puff into the lungs daily.    Marland Kitchen ZETIA 10 MG tablet Take 10 mg by mouth daily.      ROS:   Pt unable to provide secondary to dementia  Physical Examination  Vitals:   09/08/17 0045 09/08/17 0059 09/08/17 0105 09/08/17 0145  BP: 124/82 105/71 119/90 (!) 145/91  Pulse: 93 90 91 93  Resp: 16  (!) 21 (!) 24  Temp:   98.1 F (36.7 C)   TempSrc:   Oral   SpO2: 98%  99% 99%  Weight:      Height:        Body mass index is 25.78 kg/m.  General:  Alert not oriented, no acute distress HEENT: Normal Neck: No JVD Cardiac: Regular Rate and Rhythm  Abdomen: Soft, non-tender, + mass upper abdomen occupying most of epigastrium Skin: No rash, left foot dusky Extremity Pulses:  2+ radial, brachial, femoral,3+ right popliteal with widened full arterial pulse absent left popliteal and dorsalis pedis, posterior tibial pulses  bilaterally. Triphasic right DP doppler, monophasic left peroneal doppler no PT or DP doppler Musculoskeletal: No deformity trace left pretibial edema no thigh edema, 1+ left pedal edema  Neurologic: Upper and lower extremity motor 5/5 and symmetric  DATA:  CBC    Component Value Date/Time   WBC 9.0 09/07/2017 2342   RBC 3.80 (L) 09/07/2017 2342   HGB 10.8 (L) 09/07/2017 2342   HCT 35.1 (L) 09/07/2017 2342   PLT 380 09/07/2017 2342   MCV 92.4 09/07/2017 2342   MCH 28.4 09/07/2017 2342   MCHC 30.8 09/07/2017 2342   RDW 15.8 (H) 09/07/2017 2342   LYMPHSABS 0.9 09/07/2017 2342   MONOABS 0.6 09/07/2017 2342   EOSABS 0.0 09/07/2017 2342   BASOSABS 0.0 09/07/2017 2342    BMET    Component Value Date/Time   NA 138 09/07/2017 2342   K 4.2 09/07/2017 2342   CL 105 09/07/2017 2342   CO2 24 09/07/2017 2342   GLUCOSE 141 (H) 09/07/2017 2342   BUN 50 (H) 09/07/2017 2342   CREATININE 3.16 (H) 09/07/2017 2342   CALCIUM 9.7 09/07/2017 2342   GFRNONAA 17 (L) 09/07/2017 2342   GFRAA 19 (L) 09/07/2017 2342    ASSESSMENT:  1. Left leg DVT.  This is not phlegmasia. He has no thigh or calf edema.  Would continue anticoagulation with heparin.  Lovenox option limited in light of current renal failure  2. Acute renal failure most likely prerenal rather that related to myoglobinuria from foot. Would recommend hydration  3. Left foot ischemia most likely progression of underlying PAD.  ABI was 0.3 2 months ago and he has probably progressed.  He is not a candidate for any type of revascularization at this point with his underlying co-morbidities  Especially his dementia.  I have offered the family a palliative left Above knee amputation although his pain currently is mild to moderate.  They want to discuss whether to proceed.  It would be high risk in face of current acute DVT.  4. Palliative care.  Pt had seen them in the past probably worth revisiting this  5. Mass epigastrium if family wishes  to be aggressive would suggest CT scan abdomen.  This may be related to his underlying cirrhosis but in light if recent DVT certainly could represent malignancy  6. 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  Please call if family wishes to proceed with amputation.   Ruta Hinds, MD [facility] Office: [phone] Pager: [phone]

## 2017-09-08 NOTE — ED Notes (Signed)
Vascular surgery at bedside.

## 2017-09-08 NOTE — ED Notes (Signed)
At 0310 16 french foley placed per Dr. Broadus John d/t urinary retention.  Output = 2,000 ml's.  Foley clamped at 1,000 unclamped and an additional 1,000 ml's drained.

## 2017-09-08 NOTE — Progress Notes (Signed)
NT stated that while feeding pt lunch, he was coughing a lot while eating.  NT stated that it was after foods as well as liquids. MD notified.   Will continue to monitor.  Kurt Burke [place]

## 2017-09-08 NOTE — ED Notes (Signed)
No pulses can be found with doppler in LLE

## 2017-09-08 NOTE — ED Notes (Signed)
Patient transported to Ultrasound 

## 2017-09-08 NOTE — Progress Notes (Signed)
JAMESPAUL SECRIST is a 84 y.o. male with medical history significant for dementia, COPD, hypertension, was recently hospitalized 3 weeks ago at [facility], he was noted to have an acute DVT in his left leg and started on Lovenox then, his hospitalization was complicated by delirium agitation and confusion felt to be secondary to dementia, subsequently discharged to a skilled nursing facility. History is limited due to dementia, was sent to the emergency room at [facility] with concerns due to cold dusky left foot, according to her his knees he was last walking about 3 weeks ago.   Vascular surgery consulted and recommended palliative  BKA.  But family didn ot want to pursue it .  Palliative care consulted and he was transitioned to comfort care.    Hosie Poisson, MD [phone]

## 2017-09-08 NOTE — Consult Note (Signed)
Consultation Note Date: 09/08/2017   Patient Name: Kurt Burke  DOB: Jan 12, 1929  MRN: [id number]  Age / Sex: 84 y.o., male  PCP: Celene Squibb, MD Referring Physician: Hosie Poisson, MD  Reason for Consultation: Establishing goals of care and Psychosocial/spiritual support  HPI/Patient Profile: 84 y.o. male  admitted on 09/07/2017 with   medical history significant for dementia, COPD, hypertension, was recently hospitalized 3 weeks ago at [facility], he was noted to have an acute DVT in his left leg and started on Lovenox then, his hospitalization was complicated by delirium agitation and confusion felt to be secondary to dementia, subsequently discharged to a skilled nursing facility.  This patient has known peripheral arterial disease and was seen by vascular for evaluation in July of this year.  At that time he was thought to be a marginal candidate for revascularization in [place] to multiple comorbidities specific to his dementia and liver disease.  Today it is noted that he has worsening renal failure with rising creatinine.  Above-the-knee amputation was being considered by family knowing he is high risk for surgery.   Family face treatment option decisions, advanced directive decisions and anticipatory care needs.   Clinical Assessment and Goals of Care:  This NP [name] reviewed medical records, received report from team, assessed the patient and then meet at the patient's bedside along with Elmo Putt Carter/neice/ W# [phone]  to discuss diagnosis, prognosis, GOC, EOL wishes disposition and options.  Concept of Hospice and Palliative Care were discussed  A detailed discussion was had today regarding advanced directives.  Concepts specific to code status, artifical feeding and hydration, continued IV antibiotics and rehospitalization was had.  The difference between a aggressive medical  intervention path  and a palliative comfort care path for this patient at this time was had.  Values and goals of care important to patient and family were attempted to be elicited.  MOST form completed to reflect comfort  Natural trajectory and expectations at EOL were discussed.  Questions and concerns addressed.   Family encouraged to call with questions or concerns.    PMT will continue to support holistically.       NEXT OF KIN- no documented HPOA/ niece/  is acting in patient's best interest, no other family member available     SUMMARY OF RECOMMENDATIONS    Code Status/Advance Care Planning:  DNR   Declined amputation  Symptom management to maximize comfort  Dc back to facility with hospice services   Symptom Management:   Pain_ Tylenol 650 mg po every 8 hrs             Oxycodone 5 mg po every 6 hrs prn  Weak swallow- speech to make recommendation for comfort feeds with lowest risks  Palliative Prophylaxis:   Aspiration, Bowel Regimen, Delirium Protocol, Frequent Pain Assessment and Oral Care  Additional Recommendations (Limitations, Scope, Preferences):  Full Comfort Care  Psycho-social/Spiritual:   Desire for further Chaplaincy support:yes  Additional Recommendations: Education on Hospice  Prognosis:   <  6 weeks  Discharge Planning: [place] with Hospice      Primary Diagnoses: Present on Admission: . Ischemic pain of left foot . AKI (acute kidney injury) ([place]) . FTT (failure to thrive) in adult . COPD (chronic obstructive pulmonary disease) ([place]) . Protein-calorie malnutrition, severe . Dementia . Ischemic foot   I have reviewed the medical record, interviewed the patient and family, and examined the patient. The following aspects are pertinent.  Past Medical History:  Diagnosis Date  . Arthritis   . COPD (chronic obstructive pulmonary disease) ([place])   . GERD (gastroesophageal reflux disease)   . HOH (hard of  hearing)   . Hypertension   . Shortness of breath    exertion   Social History   Socioeconomic History  . Marital status: Single    Spouse name: Not on file  . Number of children: Not on file  . Years of education: 39  . Highest education level: Not on file  Occupational History  . Not on file  Social Needs  . Financial resource strain: Not on file  . Food insecurity:    Worry: Not on file    Inability: Not on file  . Transportation needs:    Medical: Not on file    Non-medical: Not on file  Tobacco Use  . Smoking status: Former Smoker    Types: Cigarettes    Last attempt to quit: 10/22/1990    Years since quitting: 26.8  . Smokeless tobacco: Never Used  Substance and Sexual Activity  . Alcohol use: No    Alcohol/week: 0.0 standard drinks  . Drug use: No  . Sexual activity: Not on file  Lifestyle  . Physical activity:    Days per week: Not on file    Minutes per session: Not on file  . Stress: Not on file  Relationships  . Social connections:    Talks on phone: Not on file    Gets together: Not on file    Attends religious service: Not on file    Active member of club or organization: Not on file    Attends meetings of clubs or organizations: Not on file    Relationship status: Not on file  Other Topics Concern  . Not on file  Social History Narrative  . Not on file   Family History  Problem Relation Age of Onset  . Heart disease Mother   . Heart disease Father   . Healthy Sister   . Arthritis Unknown    Scheduled Meds: . ezetimibe  10 mg Oral Daily  . feeding supplement (ENSURE ENLIVE)  237 mL Oral TID BM  . metoprolol succinate  37.5 mg Oral BID  . mirtazapine  7.5 mg Oral QHS  . pantoprazole  40 mg Oral Daily  . tamsulosin  0.4 mg Oral QPC supper  . umeclidinium-vilanterol  1 puff Inhalation Daily   Continuous Infusions: . sodium chloride 75 mL/hr at 09/08/17 1043  . heparin 1,000 Units/hr (09/08/17 1309)   PRN Meds:.acetaminophen **OR**  acetaminophen, levalbuterol, ondansetron **OR** ondansetron (ZOFRAN) IV Medications Prior to Admission:  Prior to Admission medications   Medication Sig Start Date End Date Taking? Authorizing Provider  Calcium Carbonate-Vitamin D (CALCIUM 500 + D) 500-125 MG-UNIT TABS Take 1 tablet by mouth daily.   Yes [provider]  cholecalciferol (VITAMIN D) 1000 units tablet Take 1,000 Units by mouth daily.   Yes [provider]  dexlansoprazole (DEXILANT) 60 MG capsule Take 60 mg by  mouth daily.   Yes [provider]  enoxaparin (LOVENOX) 80 MG/0.8ML injection Inject 0.75 mLs (75 mg total) into the skin 2 (two) times daily. 08/19/17 08/19/18 Yes Shah, Pratik D, DO  feeding supplement, ENSURE ENLIVE, (ENSURE ENLIVE) LIQD Take 237 mLs by mouth 3 (three) times daily between meals. 08/19/17  Yes Shah, Pratik D, DO  levalbuterol (XOPENEX) 0.63 MG/3ML nebulizer solution Take 3 mLs (0.63 mg total) by nebulization every 8 (eight) hours as needed for wheezing or shortness of breath. 02/27/15  Yes Kelvin Cellar, MD  meclizine (ANTIVERT) 25 MG tablet Take 25 mg by mouth 4 (four) times daily as needed for dizziness.    Yes [provider]  metoprolol succinate (TOPROL-XL) 25 MG 24 hr tablet Take 1.5 tablets (37.5 mg total) by mouth 2 (two) times daily. 08/19/17 09/11/2017 Yes Shah, Pratik D, DO  mirtazapine (REMERON) 15 MG tablet Take 7.5 mg by mouth at bedtime.   Yes [provider]  Multiple Vitamin (MULTI-DAY VITAMINS PO) Take 1 tablet by mouth daily.   Yes [provider]  tamsulosin (FLOMAX) 0.4 MG CAPS capsule Take 1 capsule (0.4 mg total) by mouth daily after supper. 08/19/17 09/09/2017 Yes Shah, Pratik D, DO  umeclidinium-vilanterol (ANORO ELLIPTA) 62.5-25 MCG/INH AEPB Inhale 1 puff into the lungs daily.   Yes [provider]  ZETIA 10 MG tablet Take 10 mg by mouth daily. 08/26/11  Yes [provider]   Allergies  Allergen Reactions  . Codeine  Other (See Comments)    Unknown reaction  . Ibuprofen     MD took patient off as he had a blood clot in his leg  . Tylenol [Acetaminophen] Other (See Comments)    unknown   Review of Systems  Unable to perform ROS: Mental status change    Physical Exam  Constitutional: He appears lethargic. He appears ill.  -frail, elderly   Cardiovascular: Normal rate, regular rhythm and normal heart sounds.  Pulmonary/Chest: Tachypnea noted.  Musculoskeletal:       Left foot: There is decreased range of motion.  -generalized weakness, muscle atrophy  Feet:  Left Foot:  Skin Integrity: Positive for erythema.  Neurological: He appears lethargic.  Skin: Skin is warm and dry.    Vital Signs: BP 105/76 (BP Location: Left Arm)   Pulse 83   Temp 97.8 F (36.6 C) (Oral)   Resp 20   Ht 5\' 8"  (1.727 m)   Wt 76.9 kg   SpO2 91%   BMI 25.78 kg/m  Pain Scale: 0-10   Pain Score: 8    SpO2: SpO2: 91 % O2 Device:SpO2: 91 % O2 Flow Rate: .   IO: Intake/output summary:   Intake/Output Summary (Last 24 hours) at 09/08/2017 1423 Last data filed at 09/08/2017 1403 Gross per 24 hour  Intake 240 ml  Output 3300 ml  Net -3060 ml    LBM:   Baseline Weight: Weight: 76.9 kg Most recent weight: Weight: 76.9 kg     Palliative Assessment/Data: 30 % at best  Discussed with Dr Karleen Hampshire    Time In: 1500 Time Out: 1615 Time Total: 75 minutes Greater than 50%  of this time was spent counseling and coordinating care related to the above assessment and plan.  Signed by: [name], NP   Please contact Palliative Medicine Team phone at [phone] for questions and concerns.  For individual provider: See Shea Evans

## 2017-09-08 NOTE — ED Notes (Signed)
Attempted to call report x2

## 2017-09-08 NOTE — ED Notes (Signed)
Attempted to call report x 1  

## 2017-09-08 NOTE — Progress Notes (Signed)
[place] for heparin Indication: DVT  Allergies  Allergen Reactions  . Codeine Other (See Comments)    Unknown reaction  . Ibuprofen     MD took patient off as he had a blood clot in his leg  . Tylenol [Acetaminophen] Other (See Comments)    unknown    Patient Measurements: Height: 5\' 8"  (172.7 cm) Weight: 169 lb 8.5 oz (76.9 kg) IBW/kg (Calculated) : 68.4 HEPARIN DW (KG): 76.9   Vital Signs: Temp: 98.3 F (36.8 C) (09/02 0830) Temp Source: Oral (09/02 0830) BP: 109/80 (09/02 0830) Pulse Rate: 97 (09/02 0830)  Labs: Recent Labs    09/07/17 2342 09/08/17 0817 09/08/17 0933  HGB 10.8*  --   --   HCT 35.1*  --   --   PLT 380  --   --   APTT 67*  --   --   LABPROT 16.3*  --   --   INR 1.32  --   --   HEPARINUNFRC  --   --  1.80*  CREATININE 3.16* 2.92*  --    Estimated Creatinine Clearance: 17.9 mL/min (A) (by C-G formula based on SCr of 2.92 mg/dL (H)).  Assessment: 84 yo male with history of DVT in LLE brought to ED for discoloration, pain and coldness of left foot. He was being treated with therapeutic lovenox BID with last dose 9/1 at 1800. Pt also with AKI and CrCl of ~17. Lovenox is likely still present in his system. MD wished to start IV heparin despite recent therapeutic lovenox due to clinical situation.  Initial heparin level is significantly elevated as anticipated with overlapped heparin and therapeutic lovenox. RN confirmed that the level was drawn correctly. No bleeding noted.   Goal of Therapy:  Heparin level 0.3-0.7 units/ml   Plan:  Hold heparin x 2 hours - will restart at 1000 units/hr (pt was recently therapeutic on 1200 units/hr - may ultimately require heparin dose increase once lovenox clears from his system) Check an 8 hr heparin level after restarting Daily heparin level and CBC  Jabron Weese, Rande Lawman, [facility] 09/08/2017,11:05 AM

## 2017-09-08 NOTE — ED Provider Notes (Addendum)
Patient transferred from [facility] emergency department for vascular evaluation.  Patient with a history of baseline peripheral arterial disease is currently being treated for DVT with Lovenox.  He has had progressive swelling and discoloration of his foot despite Lovenox treatment.  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  Focal exam:  Left ankle and foot swollen and edematous, dusky and purplish in color, cool to the touch.  No dorsalis pedal pulse palpable.  Discussed with Dr. Oneida Alar, he has seen the patient in the ER.  He has spoken with the family, believes the patient will likely need an above-the-knee amputation.  Family wants to discuss with other family members.  Dr. Oneida Alar requests medicine admission, continued heparin, will follow patient.   Orpah Greek, MD 09/08/17 0109    Orpah Greek, MD 09/08/17 [phone]

## 2017-09-08 NOTE — H&P (Addendum)
History and Physical    Kurt Burke WRU:[id number] DOB: 26-Sep-1929 DOA: 09/07/2017  Referring MD/NP/PA: EDP PCP:  Patient coming from: SNF Chief Complaint: foot pain  HPI: Kurt Burke is a 84 y.o. male with medical history significant for dementia, COPD, hypertension, was recently hospitalized 3 weeks ago at [facility], he was noted to have an acute DVT in his left leg and started on Lovenox then, his hospitalization was complicated by delirium agitation and confusion felt to be secondary to dementia, subsequently discharged to a skilled nursing facility. History is limited due to dementia, was sent to the emergency room at [facility] with concerns due to cold dusky left foot, according to her his knees he was last walking about 3 weeks ago.  ED Course: Found to have acute worsening of creatinine to 3 today, vascular surgery evaluation completed, above-knee amputation recommended for palliation  Review of Systems: Limited due to dementia  Past Medical History:  Diagnosis Date  . Arthritis   . COPD (chronic obstructive pulmonary disease) ([place])   . GERD (gastroesophageal reflux disease)   . HOH (hard of hearing)   . Hypertension   . Shortness of breath    exertion    Past Surgical History:  Procedure Laterality Date  . COLONOSCOPY  10/07/2011   Procedure: COLONOSCOPY;  Surgeon: Rogene Houston, MD;  Location: [facility];  Service: Endoscopy;  Laterality: N/A;  730  . CYSTOSCOPY  2000   Preformed to remove cyst from liver at [place]     reports that he quit smoking about 26 years ago. His smoking use included cigarettes. He has never used smokeless tobacco. He reports that he does not drink alcohol or use drugs.  Allergies  Allergen Reactions  . Acetaminophen   . Codeine Other (See Comments)    Unknown reaction  . Ibuprofen     MD took patient off as he had a blood clot in his leg    Family History  Problem  Relation Age of Onset  . Heart disease Mother   . Heart disease Father   . Healthy Sister   . Arthritis Unknown      Prior to Admission medications   Medication Sig Start Date End Date Taking? Authorizing Provider  Calcium Carbonate-Vitamin D (CALCIUM 500 + D) 500-125 MG-UNIT TABS Take 1 tablet by mouth daily.    [provider]  carbamide peroxide (DEBROX) 6.5 % otic solution Place 5 drops into both ears 2 (two) times daily as needed (ear wax removal).     [provider]  cholecalciferol (VITAMIN D) 1000 units tablet Take 1,000 Units by mouth daily.    [provider]  dexlansoprazole (DEXILANT) 60 MG capsule Take 60 mg by mouth daily.    [provider]  enoxaparin (LOVENOX) 80 MG/0.8ML injection Inject 0.75 mLs (75 mg total) into the skin 2 (two) times daily. 08/19/17 08/19/18  [name], Pratik D, DO  feeding supplement, ENSURE ENLIVE, (ENSURE ENLIVE) LIQD Take 237 mLs by mouth 3 (three) times daily between meals. 08/19/17   [name], Pratik D, DO  levalbuterol (XOPENEX) 0.63 MG/3ML nebulizer solution Take 3 mLs (0.63 mg total) by nebulization every 8 (eight) hours as needed for wheezing or shortness of breath. 02/27/15   Kelvin Cellar, MD  meclizine (ANTIVERT) 25 MG tablet Take 25 mg by mouth 4 (four) times daily as needed for dizziness.     [provider]  metoprolol succinate (TOPROL-XL) 25 MG 24 hr tablet Take 1.5 tablets (37.5 mg total) by mouth 2 (two) times daily. 08/19/17 09/30/2017  [name], Pratik D, DO  Multiple Vitamin (MULTI-DAY VITAMINS PO) Take 1 tablet by mouth daily.    [provider]  tamsulosin (FLOMAX) 0.4 MG CAPS capsule Take 1 capsule (0.4 mg total) by mouth daily after supper. 08/19/17 09/17/2017  [name], Pratik D, DO  umeclidinium-vilanterol (ANORO ELLIPTA) 62.5-25 MCG/INH AEPB Inhale 1 puff into the lungs daily.    [provider]  ZETIA 10 MG tablet Take 10 mg by mouth daily. 08/26/11   [provider]     Physical Exam: Vitals:   09/08/17 0045 09/08/17 0059 09/08/17 0105 09/08/17 0145  BP: 124/82 105/71 119/90 (!) 145/91  Pulse: 93 90 91 93  Resp: 16  (!) 21 (!) 24  Temp:   98.1 F (36.7 C)   TempSrc:   Oral   SpO2: 98%  99% 99%  Weight:      Height:          Constitutional: elderly thinly built male laying in bed, no distress, oriented to self only Vitals:   09/08/17 0045 09/08/17 0059 09/08/17 0105 09/08/17 0145  BP: 124/82 105/71 119/90 (!) 145/91  Pulse: 93 90 91 93  Resp: 16  (!) 21 (!) 24  Temp:   98.1 F (36.7 C)   TempSrc:   Oral   SpO2: 98%  99% 99%  Weight:      Height:       Eyes: PERRL, lids and conjunctivae normal ENMT: Mucous membranes are moist.  Neck: normal, supple Respiratory: poor air movement, no wheezes Cardiovascular: Regular rate and rhythm, no murmurs / rubs / gallops Abdomen: soft, nontender, suspect massively distended bladder, bowel sounds present Musculoskeletal: No joint deformity upper and lower extremities. Ext: left foot is cold, swollen and dusky, unable to appreciate dorsalis pedis and posterior tibial pulses in both feet Skin: as above Neurologic:pleasant but confused, no localizing signs Psychiatric: unable to  Labs on Admission: I have personally reviewed following labs and imaging studies  CBC: Recent Labs  Lab 09/07/17 2342  WBC 9.0  NEUTROABS 7.5  HGB 10.8*  HCT 35.1*  MCV 92.4  PLT 408   Basic Metabolic Panel: Recent Labs  Lab 09/07/17 2342  NA 138  K 4.2  CL 105  CO2 24  GLUCOSE 141*  BUN 50*  CREATININE 3.16*  CALCIUM 9.7   GFR: Estimated Creatinine Clearance: 16.5 mL/min (A) (by C-G formula based on SCr of 3.16 mg/dL (H)). Liver Function Tests: No results for input(s): AST, ALT, ALKPHOS, BILITOT, PROT, ALBUMIN in the last 168 hours. No results for input(s): LIPASE, AMYLASE in the last 168 hours. No results for input(s): AMMONIA in the last 168 hours. Coagulation Profile: Recent Labs  Lab  09/07/17 2342  INR 1.32   Cardiac Enzymes: No results for input(s): CKTOTAL, CKMB, CKMBINDEX, TROPONINI in the last 168 hours. BNP (last 3 results) No results for input(s): PROBNP in the last 8760 hours. HbA1C: No results for input(s): HGBA1C in the last 72 hours. CBG: No results for input(s): GLUCAP in the last 168 hours. Lipid Profile: No results for input(s): CHOL, HDL, LDLCALC, TRIG, CHOLHDL, LDLDIRECT in the last 72 hours. Thyroid Function Tests: No results for input(s): TSH, T4TOTAL, FREET4, T3FREE, THYROIDAB in the last 72 hours. Anemia Panel: No results for input(s): VITAMINB12, FOLATE, FERRITIN, TIBC, IRON, RETICCTPCT in the last 72 hours. Urine analysis:  Component Value Date/Time   COLORURINE YELLOW 08/14/2017 [place] 08/14/2017 1355   LABSPEC 1.019 08/14/2017 1355   PHURINE 5.0 08/14/2017 1355   GLUCOSEU NEGATIVE 08/14/2017 1355   HGBUR NEGATIVE 08/14/2017 [place] 08/14/2017 1355   KETONESUR NEGATIVE 08/14/2017 1355   PROTEINUR NEGATIVE 08/14/2017 1355   UROBILINOGEN 0.2 07/21/2012 1540   NITRITE NEGATIVE 08/14/2017 1355   LEUKOCYTESUR NEGATIVE 08/14/2017 1355   Sepsis Labs: @LABRCNTIP (procalcitonin:4,lacticidven:4) )No results found for this or any previous visit (from the past 240 hour(s)).   Radiological Exams on Admission: Dg Foot Complete Left  Result Date: 09/08/2017 CLINICAL DATA:  84 year old male with left foot swelling and ecchymosis. EXAM: LEFT FOOT - COMPLETE 3+ VIEW COMPARISON:  None. FINDINGS: There is no acute fracture or dislocation. The bones are osteopenic. There is diffuse soft tissue edema of the distal leg and foot. Vascular calcification noted. IMPRESSION: 1. No acute fracture or dislocation. 2. Diffuse soft tissue edema, likely cellulitis. No radiopaque foreign object or soft tissue air. Electronically Signed   By: Anner Crete M.D.   On: 09/08/2017 00:27   Dg Hip Unilat W Or Wo Pelvis 2-3 Views  Left  Result Date: 09/08/2017 CLINICAL DATA:  Swelling and ecchymosis of the foot. EXAM: DG HIP (WITH OR WITHOUT PELVIS) 2-3V LEFT COMPARISON:  None. FINDINGS: There is lower lumbar degenerative disc and facet arthropathy of the included lumbar spine from L3 through S1. No pelvic diastasis or fracture. Osteoarthritis of both hip joints. Slightly aspherical appearance of both femoral heads which likely reflects a component of fibrosis TAVR impingement morphology. Soft tissue induration is seen along the lateral aspect left hip. IMPRESSION: 1. Soft tissue induration along the lateral aspect of the left hip and thigh suspicious for possible soft tissue contusion or possibly some mild third spacing of fluid. 2. Lower lumbar degenerative disc and facet arthropathy. 3. No acute pelvic or hip fracture. Electronically Signed   By: Ashley Royalty M.D.   On: 09/08/2017 00:30    Assessment/Plan     Ischemic left foot -appreciate vascular surgery evaluation -suspected to have progression of underlying PAD, Dr. Oneida Alar has offered palliative left above-knee amputation, patient's neice is undecided and wants to discuss this further with her family especially mother -Palliative consult requested for goals of care  Acute kidney injury -Suspect this is secondary to urinary retention, on exam suspect distended bladder -He had a Foley catheter until a week ago for retention, asked staff RN to do stat bladder scan and place Foley catheter if urinary retention -If this is negative we'll need renal ultrasound and additional workup -IV fluids for now  Left leg DVT -Hold Lovenox, continue anticoagulation with IV heparin for now, pending improvement in kidney function and decision regarding surgery  Dementia  Severe protein calorie malnutrition -Continue supplements as tolerated  COPD -Stable continue nebs PRN  Adult failure to thrive -due to all of the above -Palliative consult for goals of care  DVT  prophylaxis: IV heparin Code Status: DNR , discussed with niece Family Communication: discussed with niece [place] at bedside Disposition Plan: back to SNF Consults called: vascular surgery Admission status: inpatient   Domenic Polite MD [facility] Pager [phone]  If 7PM-7AM, please contact night-coverage [email or web address] Password [email or web address]  09/08/2017, 2:55 AM

## 2017-09-09 DIAGNOSIS — Z515 Encounter for palliative care: Secondary | ICD-10-CM | POA: Diagnosis not present

## 2017-09-09 DIAGNOSIS — R404 Transient alteration of awareness: Secondary | ICD-10-CM | POA: Diagnosis not present

## 2017-09-09 DIAGNOSIS — I82502 Chronic embolism and thrombosis of unspecified deep veins of left lower extremity: Secondary | ICD-10-CM | POA: Diagnosis not present

## 2017-09-09 DIAGNOSIS — J449 Chronic obstructive pulmonary disease, unspecified: Secondary | ICD-10-CM | POA: Diagnosis not present

## 2017-09-09 DIAGNOSIS — R627 Adult failure to thrive: Secondary | ICD-10-CM

## 2017-09-09 DIAGNOSIS — F0390 Unspecified dementia without behavioral disturbance: Secondary | ICD-10-CM

## 2017-09-09 DIAGNOSIS — M2550 Pain in unspecified joint: Secondary | ICD-10-CM | POA: Diagnosis not present

## 2017-09-09 DIAGNOSIS — M6281 Muscle weakness (generalized): Secondary | ICD-10-CM | POA: Diagnosis not present

## 2017-09-09 DIAGNOSIS — Z743 Need for continuous supervision: Secondary | ICD-10-CM | POA: Diagnosis not present

## 2017-09-09 DIAGNOSIS — E43 Unspecified severe protein-calorie malnutrition: Secondary | ICD-10-CM | POA: Diagnosis not present

## 2017-09-09 DIAGNOSIS — I999 Unspecified disorder of circulatory system: Secondary | ICD-10-CM

## 2017-09-09 DIAGNOSIS — M79672 Pain in left foot: Secondary | ICD-10-CM

## 2017-09-09 DIAGNOSIS — I82402 Acute embolism and thrombosis of unspecified deep veins of left lower extremity: Secondary | ICD-10-CM | POA: Diagnosis not present

## 2017-09-09 DIAGNOSIS — R279 Unspecified lack of coordination: Secondary | ICD-10-CM | POA: Diagnosis not present

## 2017-09-09 DIAGNOSIS — I998 Other disorder of circulatory system: Secondary | ICD-10-CM | POA: Diagnosis not present

## 2017-09-09 DIAGNOSIS — R600 Localized edema: Secondary | ICD-10-CM | POA: Diagnosis not present

## 2017-09-09 DIAGNOSIS — I10 Essential (primary) hypertension: Secondary | ICD-10-CM | POA: Diagnosis not present

## 2017-09-09 DIAGNOSIS — K7460 Unspecified cirrhosis of liver: Secondary | ICD-10-CM | POA: Diagnosis not present

## 2017-09-09 DIAGNOSIS — R1312 Dysphagia, oropharyngeal phase: Secondary | ICD-10-CM | POA: Diagnosis not present

## 2017-09-09 DIAGNOSIS — E860 Dehydration: Secondary | ICD-10-CM | POA: Diagnosis not present

## 2017-09-09 DIAGNOSIS — Z7401 Bed confinement status: Secondary | ICD-10-CM | POA: Diagnosis not present

## 2017-09-09 DIAGNOSIS — N179 Acute kidney failure, unspecified: Secondary | ICD-10-CM | POA: Diagnosis not present

## 2017-09-09 MED ORDER — ACETAMINOPHEN 325 MG PO TABS: 650.0000 mg | ORAL_TABLET | Freq: Three times a day (TID) | ORAL | 0 refills | Status: AC

## 2017-09-09 MED ORDER — OXYCODONE HCL 5 MG PO TABS: 5.0000 mg | ORAL_TABLET | Freq: Four times a day (QID) | ORAL | 0 refills | Status: AC | PRN

## 2017-09-09 NOTE — Discharge Summary (Signed)
Physician Discharge Summary  Kurt Burke HWE:[id number] DOB: 21-Nov-1929 DOA: 09/07/2017  PCP: System, Pcp Not In  Admit date: 09/07/2017 Discharge date: 09/09/2017  Admitted From: SNF Disposition:  SNF with hospice.   Recommendations for Outpatient Follow-up:  Follow up with hospice MD as needed.    Discharge Condition: hospice.  CODE STATUS:comfort care.  Diet recommendation: comfort feeds.   Brief/Interim Summary: Kurt Madan Cummingsis a 84 y.o.malewith medical history significantfor dementia, COPD, hypertension, was recently hospitalized 3 weeks ago at [facility], he was noted to have an acute DVT in his left leg and started on Lovenox then, his hospitalization was complicated by delirium agitation and confusion felt to be secondary to dementia, subsequently discharged to a skilled nursing facility. History is limited due to dementia, was sent to the emergency room at [facility] with concerns due to cold dusky left foot, according to her his knees he was last walking about 3 weeks ago.   Vascular surgery consulted and recommended palliative  BKA.  But family didn ot want to pursue it .  Palliative care consulted and he was transitioned to comfort care.    Discharge Diagnoses:  Active Problems:   AKI (acute kidney injury) ([place])   FTT (failure to thrive) in adult   COPD (chronic obstructive pulmonary disease) (H[facility])   Protein-calorie malnutrition, severe   DNR (do not resuscitate) discussion   Ischemic pain of left foot   Dementia   Ischemic foot  Ischemic left foot:  S/p vascular surgery evaluation, recommending palliative left AKA, pts niece does not want to go forward with it.  Palliative care consulted and plan for d/c to SNF with hospice.   AKI:  Improved with hydration. Would not pursue hydration.    Left LEG DVT;  Stopped the lovenox.    Dementia: Stable ,   Severe Protein calorie malnutrition:  Continue supplements.    Adult failure to  thrive  From the above.    COPD:  No wheezing heard.   Discharge Instructions  Discharge Instructions    Discharge instructions   Complete by:  As directed    Follow up with hospice MD as recommended.     Allergies as of 09/09/2017      Reactions   Codeine Other (See Comments)   Unknown reaction   Ibuprofen    MD took patient off as he had a blood clot in his leg   Tylenol [acetaminophen] Other (See Comments)   unknown      Medication List    STOP taking these medications   CALCIUM 500 + D 500-125 MG-UNIT Tabs Generic drug:  Calcium Carbonate-Vitamin D   cholecalciferol 1000 units tablet Commonly known as:  VITAMIN D   MULTI-DAY VITAMINS PO   ZETIA 10 MG tablet Generic drug:  ezetimibe     TAKE these medications   acetaminophen 325 MG tablet Commonly known as:  TYLENOL Take 2 tablets (650 mg total) by mouth every 8 (eight) hours.   ANORO ELLIPTA 62.5-25 MCG/INH Aepb Generic drug:  umeclidinium-vilanterol Inhale 1 puff into the lungs daily.   dexlansoprazole 60 MG capsule Commonly known as:  DEXILANT Take 60 mg by mouth daily.   enoxaparin 80 MG/0.8ML injection Commonly known as:  LOVENOX Inject 0.75 mLs (75 mg total) into the skin 2 (two) times daily.   feeding supplement (ENSURE ENLIVE) Liqd Take 237 mLs by mouth 3 (three) times daily between meals.   levalbuterol 0.63 MG/3ML nebulizer solution Commonly known as:  Penne Lash  Take 3 mLs (0.63 mg total) by nebulization every 8 (eight) hours as needed for wheezing or shortness of breath.   meclizine 25 MG tablet Commonly known as:  ANTIVERT Take 25 mg by mouth 4 (four) times daily as needed for dizziness.   metoprolol succinate 25 MG 24 hr tablet Commonly known as:  TOPROL-XL Take 1.5 tablets (37.5 mg total) by mouth 2 (two) times daily.   mirtazapine 15 MG tablet Commonly known as:  REMERON Take 7.5 mg by mouth at bedtime.   oxyCODONE 5 MG immediate release tablet Commonly known as:  Oxy  IR/ROXICODONE Take 1 tablet (5 mg total) by mouth every 6 (six) hours as needed for moderate pain.   tamsulosin 0.4 MG Caps capsule Commonly known as:  FLOMAX Take 1 capsule (0.4 mg total) by mouth daily after supper.       Allergies  Allergen Reactions  . Codeine Other (See Comments)    Unknown reaction  . Ibuprofen     MD took patient off as he had a blood clot in his leg  . Tylenol [Acetaminophen] Other (See Comments)    unknown    Consultations:  palliative care   Vascular surgery consult.    Procedures/Studies: Ct Head Wo Contrast  Result Date: 08/14/2017 CLINICAL DATA:  84 year old male with altered level of consciousness today. Initial encounter. EXAM: CT HEAD WITHOUT CONTRAST TECHNIQUE: Contiguous axial images were obtained from the base of the skull through the vertex without intravenous contrast. COMPARISON:  06/15/2012 head CT. FINDINGS: Brain: No intracranial hemorrhage or CT evidence of large acute infarct. Remote right thalamic infarct. Moderate chronic microvascular changes. Moderate global atrophy. No intracranial mass lesion noted on this unenhanced exam. Vascular: Vascular calcifications.  No hyperdense vessel. Skull: No acute abnormality. Sinuses/Orbits: Post lens replacement. No acute orbital abnormality. Minimal mucosal thickening right maxillary sinus. Other: Mastoid air cells and minimally cavities are clear. Prominent scalp calcifications once again noted. IMPRESSION: 1. No acute intracranial abnormality. 2. Remote small right thalamic infarct. Moderate chronic microvascular changes. 3. Moderate global atrophy. Electronically Signed   By: Kurt Burke M.D.   On: 08/14/2017 16:42   US Renal  Result Date: 09/08/2017 CLINICAL DATA:  Acute kidney injury. EXAM: RENAL / URINARY TRACT ULTRASOUND COMPLETE COMPARISON:  Abdominal ultrasound May 10, 2016 FINDINGS: Right Kidney: Length: 11.4 cm. Echogenicity within normal limits. Multiple anechoic cysts measuring to 4.8  cm. 4.3 cm parapelvic cyst with debris versus artifact. No suspicious mass or hydronephrosis visualized. Left Kidney: Length: 9.8 cm. Echogenicity within normal limits. No suspicious mass or hydronephrosis visualized. Bladder: Decompressed by Foley catheter. IMPRESSION: 1. No obstructive uropathy. 2. Multiple RIGHT renal cysts. Electronically Signed   By: Elon Alas M.D.   On: 09/08/2017 03:52   US Venous Img Lower Unilateral Left  Result Date: 08/18/2017 CLINICAL DATA:  84 year old with left lower extremity swelling. EXAM: LEFT LOWER EXTREMITY VENOUS DOPPLER ULTRASOUND TECHNIQUE: Gray-scale sonography with graded compression, as well as color Doppler and duplex ultrasound were performed to evaluate the lower extremity deep venous systems from the level of the common femoral vein and including the common femoral, femoral, profunda femoral, popliteal and calf veins including the posterior tibial, peroneal and gastrocnemius veins when visible. The superficial great saphenous vein was also interrogated. Spectral Doppler was utilized to evaluate flow at rest and with distal augmentation maneuvers in the common femoral, femoral and popliteal veins. COMPARISON:  06/02/2013 FINDINGS: Contralateral Common Femoral Vein: Patient declined this portion of the examination. Common Femoral Vein:  Positive for thrombus. Echogenic thrombus in the left common femoral vein and no significant flow in the left common femoral vein. Saphenofemoral Junction: Not imaged. Profunda Femoral Vein: Not well visualized but suspect there is thrombus in the profunda femoral vein. Femoral Vein: Positive for thrombus. The femoral vein contains echogenic thrombus and the vein is non compressible. No significant flow within the left femoral vein. Popliteal Vein: Positive for thrombus. Left popliteal vein is expanded with echogenic thrombus. No significant flow within the left popliteal vein. Calf Veins: Positive for thrombus in the  gastrocnemius veins. There is blood flow in the posterior tibial veins but these veins are incompletely characterized. Superficial Great Saphenous Vein: No evidence of thrombus. Normal compressibility. Other Findings:  Subcutaneous edema in the left calf. IMPRESSION: Positive for deep venous thrombosis in the left lower extremity. Thrombus extending from the left calf up to the left common femoral vein. Electronically Signed   By: Markus Daft M.D.   On: 08/18/2017 17:42   Dg Chest Port 1 View  Result Date: 08/14/2017 CLINICAL DATA:  COPD, former smoker. Altered mental status. History of COPD. EXAM: PORTABLE CHEST 1 VIEW COMPARISON:  Frontal chest x-ray of November 26, 2015 FINDINGS: The right hemidiaphragm remains higher than the left. There is no focal infiltrate. There is no pleural effusion. The heart and pulmonary vascularity are normal. There is tortuosity of the descending thoracic aorta with mural calcification. The bony thorax exhibits no acute abnormality. IMPRESSION: Chronic elevation of the right hemidiaphragm.  No pneumonia nor CHF. Thoracic aortic atherosclerosis. Electronically Signed   By: David  Martinique M.D.   On: 08/14/2017 14:07   Dg Foot Complete Left  Result Date: 09/08/2017 CLINICAL DATA:  84 year old male with left foot swelling and ecchymosis. EXAM: LEFT FOOT - COMPLETE 3+ VIEW COMPARISON:  None. FINDINGS: There is no acute fracture or dislocation. The bones are osteopenic. There is diffuse soft tissue edema of the distal leg and foot. Vascular calcification noted. IMPRESSION: 1. No acute fracture or dislocation. 2. Diffuse soft tissue edema, likely cellulitis. No radiopaque foreign object or soft tissue air. Electronically Signed   By: Anner Crete M.D.   On: 09/08/2017 00:27   Dg Hip Unilat W Or Wo Pelvis 2-3 Views Left  Result Date: 09/08/2017 CLINICAL DATA:  Swelling and ecchymosis of the foot. EXAM: DG HIP (WITH OR WITHOUT PELVIS) 2-3V LEFT COMPARISON:  None. FINDINGS: There is  lower lumbar degenerative disc and facet arthropathy of the included lumbar spine from L3 through S1. No pelvic diastasis or fracture. Osteoarthritis of both hip joints. Slightly aspherical appearance of both femoral heads which likely reflects a component of fibrosis TAVR impingement morphology. Soft tissue induration is seen along the lateral aspect left hip. IMPRESSION: 1. Soft tissue induration along the lateral aspect of the left hip and thigh suspicious for possible soft tissue contusion or possibly some mild third spacing of fluid. 2. Lower lumbar degenerative disc and facet arthropathy. 3. No acute pelvic or hip fracture. Electronically Signed   By: Ashley Royalty M.D.   On: 09/08/2017 00:30       Subjective: No chest pain or sob.   Discharge Exam: Vitals:   09/09/17 0832 09/09/17 1100  BP:  (!) 109/50  Pulse:  (!) 111  Resp:    Temp:    SpO2: 93% 94%   Vitals:   09/08/17 2146 09/09/17 0600 09/09/17 0832 09/09/17 1100  BP: 135/69 135/65  (!) 109/50  Pulse: 94 88  (!) 111  Resp:  18 18    Temp: 99.6 F (37.6 C) 98.1 F (36.7 C)    TempSrc: Oral Oral    SpO2: 91% 95% 93% 94%  Weight:      Height:        General: Pt is alert, awake, not in acute distress Cardiovascular: RRR, S1/S2 +, Respiratory: CTA bilaterally, no wheezing, no rhonchi Abdominal: Soft, NT, ND, bowel sounds + Extremities: tenderness present.    The results of significant diagnostics from this hospitalization (including imaging, microbiology, ancillary and laboratory) are listed below for reference.     Microbiology: Recent Results (from the past 240 hour(s))  MRSA PCR Screening     Status: None   Collection Time: 09/08/17  3:00 PM  Result Value Ref Range Status   MRSA by PCR NEGATIVE NEGATIVE Final    Comment:        The GeneXpert MRSA Assay (FDA approved for NASAL specimens only), is one component of a comprehensive MRSA colonization surveillance program. It is not intended to diagnose  MRSA infection nor to guide or monitor treatment for MRSA infections. Performed at [facility], [place] [address] [place], [place] [address]      Labs: BNP (last 3 results) No results for input(s): BNP in the last 8760 hours. Basic Metabolic Panel: Recent Labs  Lab 09/07/17 2342 09/08/17 0817  NA 138 140  K 4.2 4.5  CL 105 107  CO2 24 25  GLUCOSE 141* 114*  BUN 50* 45*  CREATININE 3.16* 2.92*  CALCIUM 9.7 9.8   Liver Function Tests: No results for input(s): AST, ALT, ALKPHOS, BILITOT, PROT, ALBUMIN in the last 168 hours. No results for input(s): LIPASE, AMYLASE in the last 168 hours. No results for input(s): AMMONIA in the last 168 hours. CBC: Recent Labs  Lab 09/07/17 2342  WBC 9.0  NEUTROABS 7.5  HGB 10.8*  HCT 35.1*  MCV 92.4  PLT 380   Cardiac Enzymes: No results for input(s): CKTOTAL, CKMB, CKMBINDEX, TROPONINI in the last 168 hours. BNP: Invalid input(s): POCBNP CBG: No results for input(s): GLUCAP in the last 168 hours. D-Dimer No results for input(s): DDIMER in the last 72 hours. Hgb A1c No results for input(s): HGBA1C in the last 72 hours. Lipid Profile No results for input(s): CHOL, HDL, LDLCALC, TRIG, CHOLHDL, LDLDIRECT in the last 72 hours. Thyroid function studies No results for input(s): TSH, T4TOTAL, T3FREE, THYROIDAB in the last 72 hours.  Invalid input(s): FREET3 Anemia work up No results for input(s): VITAMINB12, FOLATE, FERRITIN, TIBC, IRON, RETICCTPCT in the last 72 hours. Urinalysis    Component Value Date/Time   COLORURINE YELLOW 08/14/2017 [place] 08/14/2017 1355   LABSPEC 1.019 08/14/2017 1355   PHURINE 5.0 08/14/2017 1355   GLUCOSEU NEGATIVE 08/14/2017 1355   HGBUR NEGATIVE 08/14/2017 1355   BILIRUBINUR NEGATIVE 08/14/2017 1355   KETONESUR NEGATIVE 08/14/2017 1355   PROTEINUR NEGATIVE 08/14/2017 1355   UROBILINOGEN 0.2 07/21/2012 1540   NITRITE NEGATIVE 08/14/2017 1355   LEUKOCYTESUR NEGATIVE  08/14/2017 1355   Sepsis Labs Invalid input(s): PROCALCITONIN,  WBC,  LACTICIDVEN Microbiology Recent Results (from the past 240 hour(s))  MRSA PCR Screening     Status: None   Collection Time: 09/08/17  3:00 PM  Result Value Ref Range Status   MRSA by PCR NEGATIVE NEGATIVE Final    Comment:        The GeneXpert MRSA Assay (FDA approved for NASAL specimens only), is one component of a comprehensive MRSA colonization  surveillance program. It is not intended to diagnose MRSA infection nor to guide or monitor treatment for MRSA infections. Performed at [facility], [place] [address] [place], [place] [address]      Time coordinating discharge: 35 minutes  SIGNED:   Hosie Poisson, MD  [facility] 09/09/2017, 11:21 AM Pager   If 7PM-7AM, please contact night-coverage [email or web address] Password [email or web address]

## 2017-09-09 NOTE — Clinical Social Work Placement (Signed)
   CLINICAL SOCIAL WORK PLACEMENT  NOTE Deborra Medina   Date:  09/09/2017  Patient Details  Name: Kurt Burke MRN: [id number] Date of Birth: 02/12/1929  Clinical Social Work is seeking post-discharge placement for this patient at the [place] level of care (*CSW will initial, date and re-position this form in  chart as items are completed):  Yes   Patient/family provided with [facility] Work Department's list of facilities offering this level of care within the geographic area requested by the patient (or if unable, by the patient's family).  Yes   Patient/family informed of their freedom to choose among providers that offer the needed level of care, that participate in Medicare, Medicaid or managed care program needed by the patient, have an available bed and are willing to accept the patient.  Yes   Patient/family informed of [facility]'s ownership interest in [facility] and [facility], as well as of the fact that they are under no obligation to receive care at these facilities.  PASRR submitted to EDS on       PASRR number received on 09/09/17     Existing PASRR number confirmed on       FL2 transmitted to all facilities in geographic area requested by pt/family on 09/09/17     FL2 transmitted to all facilities within larger geographic area on       Patient informed that his/her managed care company has contracts with or will negotiate with certain facilities, including the following:        Yes   Patient/family informed of bed offers received.  Patient chooses bed at [place] [facility]     Physician recommends and patient chooses bed at      Patient to be transferred to [facility] [place] on 09/09/17.  Patient to be transferred to facility by [facility]     Patient family notified on 09/09/17 of transfer.  Name of family member notified:  neice, [name]     PHYSICIAN       Additional Comment:     _______________________________________________ Alexander Mt, [place] 09/09/2017, 1:31 PM

## 2017-09-09 NOTE — Progress Notes (Signed)
Nutrition Brief Note  Chart reviewed. Pt now transitioning to comfort care.  No further nutrition interventions warranted at this time.  Please re-consult as needed.   Kwanza Cancelliere A. Imya Mance, RD, LDN, CDE Pager: [phone] After hours Pager: [phone]  

## 2017-09-09 NOTE — Evaluation (Signed)
Clinical/Bedside Swallow Evaluation Patient Details  Name: Kurt Burke MRN: [id number] Date of Birth: 1929-08-22  Today's Date: 09/09/2017 Time: SLP Start Time (ACUTE ONLY): 1045 SLP Stop Time (ACUTE ONLY): 1055 SLP Time Calculation (min) (ACUTE ONLY): 10 min  Past Medical History:  Past Medical History:  Diagnosis Date  . Arthritis   . COPD (chronic obstructive pulmonary disease) ([place])   . GERD (gastroesophageal reflux disease)   . HOH (hard of hearing)   . Hypertension   . Shortness of breath    exertion   Past Surgical History:  Past Surgical History:  Procedure Laterality Date  . COLONOSCOPY  10/07/2011   Procedure: COLONOSCOPY;  Surgeon: Rogene Houston, MD;  Location: [facility];  Service: Endoscopy;  Laterality: N/A;  730  . CYSTOSCOPY  2000   Preformed to remove cyst from liver at [place]   HPI:  84 y.o. male  admitted on 09/07/2017 with medical history significantfor dementia, COPD, hypertension, was recently hospitalized 3 weeks ago at [facility], he was noted to have an acute DVT in his left leg and started on Lovenox then, his hospitalization was complicated by delirium agitation and confusion felt to be secondary to dementia, subsequently discharged to a skilled nursing facility. Poor candidate for surgery, admitted with a dusky left foot. Pt to transition to comfort measures. SLP consulted for comfort feeds with lowest risk.  Pt has a history of dysphagia with severe residuals, aspiration and esopahgeal dysphagia seen on prior MBS in 2018. Pts diet was not midified at that time given that he had not suffered from any pna. His current CXR is clear.    Assessment / Plan / Recommendation Clinical Impression  Pt seen with completed am meal tray at bedside. He reports enjoying his meals, he "gets choked sometimes." Pt has a long history of severe dysphagia with aspiration, though miraculously this has not resulted in a  chronic aspiration pna. Prior therapeutic interventions for compensatory strategies and safer swallowing attempted in 2018. Other than precautions, no intervention would resolve pts primary esophageal deficits and would likely only decrease pts pleasure from eating and drinking. Would not advise diet modification given plan for comfort as pt continues to tolerate intake without discomfort. Will sign off.  SLP Visit Diagnosis: Dysphagia, oropharyngeal phase (R13.12)    Aspiration Risk  Severe aspiration risk    Diet Recommendation Regular;Thin liquid   Liquid Administration via: Cup;Straw Medication Administration: Whole meds with liquid Supervision: Patient able to self feed Compensations: Slow rate;Small sips/bites Postural Changes: Seated upright at 90 degrees    Other  Recommendations Oral Care Recommendations: Oral care BID   Follow up Recommendations None      Frequency and Duration            Prognosis        Swallow Study   General HPI: 84 y.o. male  admitted on 09/07/2017 with medical history significantfor dementia, COPD, hypertension, was recently hospitalized 3 weeks ago at [facility], he was noted to have an acute DVT in his left leg and started on Lovenox then, his hospitalization was complicated by delirium agitation and confusion felt to be secondary to dementia, subsequently discharged to a skilled nursing facility. Poor candidate for surgery, admitted with a dusky left foot. Pt to transition to comfort measures. SLP consulted for comfort feeds with lowest risk.  Pt has a history of dysphagia with severe residuals, aspiration and  esopahgeal dysphagia seen on prior MBS in 2018. Pts diet was not midified at that time given that he had not suffered from any pna. His current CXR is clear.  Type of Study: Bedside Swallow Evaluation Diet Prior to this Study: Regular;Thin liquids Respiratory Status: Room air History of Recent Intubation: No Behavior/Cognition:  Alert;Cooperative;Pleasant mood Oral Cavity Assessment: Within Functional Limits Oral Care Completed by SLP: No Oral Cavity - Dentition: Adequate natural dentition Vision: Functional for self-feeding Self-Feeding Abilities: Able to feed self Patient Positioning: Upright in bed Baseline Vocal Quality: Normal Volitional Cough: Strong Volitional Swallow: Able to elicit    Oral/Motor/Sensory Function Overall Oral Motor/Sensory Function: Within functional limits   Ice Chips     Thin Liquid Thin Liquid: Within functional limits    Nectar Thick Nectar Thick Liquid: Not tested   Honey Thick Honey Thick Liquid: Not tested   Puree Puree: Not tested   Solid     Solid: Not tested     Herbie Baltimore, MA CCC-SLP [phone]  Lynann Beaver 09/09/2017,11:20 AM

## 2017-09-09 NOTE — Progress Notes (Signed)
Attempted report to [facility] [employer] twice.  Will continue to monitor patient status until transfer.

## 2017-09-09 NOTE — Clinical Social Work Note (Signed)
Clinical Social Work Assessment  Patient Details  Name: Kurt Burke MRN: [id number] Date of Birth: 07/20/1929  Date of referral:     09/09/17             Reason for consult:  Facility Placement, End of Life/Hospice                Permission sought to share information with:  Facility Sport and exercise psychologist, Family Supports Permission granted to share information::  No  Name::     Elmo Putt   Agency::  [facility]  Relationship::  neice  Contact Information:  [phone]  Housing/Transportation Living arrangements for the past 2 months:  [place] of Information:  Other (Comment Required)(Niece) Patient Interpreter Needed:  None Criminal Activity/Legal Involvement Pertinent to Current Situation/Hospitalization:  No - Comment as needed Significant Relationships:  Other Family Members, Siblings Lives with:  Facility Resident Do you feel safe going back to the place where you live?  Yes Need for family participation in patient care:  Yes (Comment)  Care giving concerns:  Pt has been living at [facility] receiving therapies, but with the intention of transitioning to LTC. Pt now transitioning to hospice care at [facility].    Social Worker assessment / plan:  CSW spoke with pt's niece Elmo Putt, she has confirmed that she and her family desire for pt to transition back to [facility] with hospice services. Pt family state no further questions they are pleased with [facility]  and ready for pt to transition back today.   Employment status:  Retired Advertising copywriter, Self Pay (Medicaid Pending) PT Recommendations:  [place], [place] / Referral to community resources:  [place]  Patient/Family's Response to care:  Pt family amenable to return to SNF with hospice.   Patient/Family's Understanding of and Emotional Response to Diagnosis, Current Treatment, and  Prognosis:  Pt family state understanding of diagnosis, current treatment and prognosis. Pt family have elected comfort with hospice rather than ongoing support or amputation of limb.   Emotional Assessment Appearance:  Appears stated age Attitude/Demeanor/Rapport:  Unable to Assess Affect (typically observed):  Unable to Assess Orientation:  Oriented to Place, Oriented to Self, Fluctuating Orientation (Suspected and/or reported Sundowners) Alcohol / Substance use:  Not Applicable Psych involvement (Current and /or in the community):  No (Comment)  Discharge Needs  Concerns to be addressed:  Care Coordination, Discharge Planning Concerns Readmission within the last 30 days:  Yes Current discharge risk:  Physical Impairment, Cognitively Impaired Barriers to Discharge:  Ship broker, Continued Medical Work up   [employer], [place] 09/09/2017, 1:24 PM

## 2017-09-09 NOTE — Progress Notes (Signed)
   09/09/17 1200  Clinical Encounter Type  Visited With Patient  Visit Type Spiritual support  Spiritual Encounters  Spiritual Needs Prayer  Patient indicated he had a woman pastor in his church that took care of him.  Chaplain offered prayer.

## 2017-09-09 NOTE — Consult Note (Signed)
   [facility] CM Inpatient Consult   09/09/2017  Kurt Burke [birth year] [id number]   Patient screened for potential [place] [facility] services. Patient is in the [place] of the [place] Management services under patient's [facility] plan with a previous history with [facility] Care management. Reviewed for re-start of care needs.   Chart review reveals patient and family decided on [place] at [facility] with [place]. No [facility] Community Care Management needs assessed.  For questions contact:   Natividad Brood, RN BSN [facility] Liaison  [phone] business mobile phone Toll free office [phone]

## 2017-09-09 NOTE — Progress Notes (Signed)
Patient being transferred via [facility] at this time in stable condition.

## 2017-09-09 NOTE — Progress Notes (Signed)
Patient ID: Kurt Burke, male   DOB: 07/04/1929, 84 y.o.   MRN: [id number]  This NP visited patient at the bedside as a follow up to  yesterday's [place].  Patient is pleasantly confused this morning and enjoying bites of his breakfast.  Niece Ms. Carter at bedside.  Continued conversation regarding current medical situation, goals of care, end-of-life wishes and disposition.  Focus of care remains comfort, quality and dignity.  Most form is completed and reflects full comfort.  Hope is to transition back to skilled nursing facility with hospice services in place.  Discussed with Ms Eulas Post  the importance of continued advocacy and conversation with    medical providers regarding overall plan of care and treatment options,  ensuring decisions are within the context of the patients values and GOCs.  Discussed natural trajectory and expectations at end of life.  Questions and concerns addressed   Discussed with Dr Karleen Hampshire  Total time spent on the unit was 25 minutes  Greater than 50% of the time was spent in counseling and coordination of care  [name] NP  Palliative Medicine Team Team Phone # [phone] Pager [phone]

## 2017-09-09 NOTE — Social Work (Addendum)
CSW aware pt from [facility], will need to see if pt able to return today.   1:08pm- CSW left message to speak with pt niece Elmo Putt, await return call.    Alexander Mt, [place] Work [phone]

## 2017-09-09 NOTE — Social Work (Signed)
Clinical Social Worker facilitated patient discharge including contacting patient family and facility to confirm patient discharge plans.  Clinical information faxed to facility and family agreeable with plan.  CSW arranged ambulance transport via [place] to [facility].  RN to call [phone] with report prior to discharge.  Clinical Social Worker will sign off for now as social work intervention is no longer needed. Please consult [place] again if new need arises.  Alexander Mt, [place] Social Worker [phone]

## 2017-09-09 NOTE — NC FL2 (Addendum)
[place] LEVEL OF CARE SCREENING TOOL     IDENTIFICATION  Patient Name: Kurt Burke Birthdate: December 10, 1929 Sex: male Admission Date (Current Location): 09/07/2017  [place] and [place] Number:  [place] [facility] [id number] [place] and Address:  The [place]. [facility], [place] [address] [place], [place] [address]      Provider Number: [id number]  Attending Physician Name and Address:  Hosie Poisson, MD  Relative Name and Phone Number:  Adele Barthel Niece [phone] or Inda Castle [phone] or Zara Chess    [phone]     Current Level of Care: Hospital Recommended Level of Care: [place] Prior Approval Number:    Date Approved/Denied:   PASRR Number: [id number]  Discharge Plan: SNF    Current Diagnoses: Patient Active Problem List   Diagnosis Date Noted  . Ischemic pain of left foot 09/08/2017  . Dementia 09/08/2017  . Ischemic foot 09/08/2017  . DNR (do not resuscitate) discussion   . Goals of care, counseling/discussion   . Palliative care by specialist   . Protein-calorie malnutrition, severe 08/16/2017  . COPD (chronic obstructive pulmonary disease) ([place])   . SOB (shortness of breath) 02/26/2015  . AKI (acute kidney injury) ([place]) 02/26/2015  . FTT (failure to thrive) in adult 02/26/2015  . Weakness 02/26/2015  . Dehydration 02/26/2015  . Altered mental status 06/16/2012  . URI (upper respiratory infection) 06/16/2012  . Fever 06/16/2012  . Rectal bleeding 09/26/2011  . Rotator cuff insufficiency 09/23/2011  . GERD (gastroesophageal reflux disease) 10/23/2010  . Cirrhosis ([place]) 10/23/2010  . SHOULDER PAIN 10/17/2008  . IMPINGEMENT SYNDROME 10/17/2008  . RUPTURE ROTATOR CUFF 10/17/2008    Orientation RESPIRATION BLADDER Height & Weight     Self  Normal Continent Weight: 169 lb 8.5 oz (76.9 kg) Height:  5\' 8"  (172.7 cm)  BEHAVIORAL SYMPTOMS/MOOD NEUROLOGICAL BOWEL NUTRITION STATUS   Continent Diet(Full Liquid diet)  AMBULATORY STATUS COMMUNICATION OF NEEDS Skin   Extensive Assist Verbally Normal                       Personal Care Assistance Level of Assistance  Feeding, Dressing, Bathing Bathing Assistance: Maximum assistance Feeding assistance: Independent Dressing Assistance: Maximum assistance     Functional Limitations Info  Hearing, Speech, Sight Sight Info: Adequate Hearing Info: Adequate(hard of hearing) Speech Info: Adequate    SPECIAL CARE FACTORS FREQUENCY  PT (By licensed PT), OT (By licensed OT)     PT Frequency: none - hospice OT Frequency: none - hospice            Contractures Contractures Info: Not present    Additional Factors Info  Code Status, Allergies, Psychotropic Code Status Info: DNR Allergies Info: CODEINE, IBUPROFEN, TYLENOL ACETAMINOPHEN  Psychotropic Info: mirtazapine (REMERON) tablet 7.5 mg daily at bedtime         Current Medications (09/09/2017):  This is the current hospital active medication list Current Facility-Administered Medications  Medication Dose Route Frequency Provider Last Rate Last Dose  . acetaminophen (TYLENOL) tablet 650 mg  650 mg Oral Q8H Knox Royalty, NP   650 mg at 09/09/17 0545  . ezetimibe (ZETIA) tablet 10 mg  10 mg Oral Daily Domenic Polite, MD   10 mg at 09/09/17 1100  . feeding supplement (ENSURE ENLIVE) (ENSURE ENLIVE) liquid 237 mL  237 mL Oral TID BM Domenic Polite, MD   237 mL at 09/09/17 1100  . levalbuterol (XOPENEX) nebulizer solution 0.63 mg  0.63 mg Nebulization Q8H PRN  Domenic Polite, MD      . metoprolol succinate (TOPROL-XL) 24 hr tablet 37.5 mg  37.5 mg Oral BID Domenic Polite, MD   37.5 mg at 09/09/17 1100  . mirtazapine (REMERON) tablet 7.5 mg  7.5 mg Oral QHS Domenic Polite, MD   7.5 mg at 09/08/17 2117  . ondansetron (ZOFRAN) tablet 4 mg  4 mg Oral Q6H PRN Domenic Polite, MD       Or  . ondansetron [facility]) injection 4 mg  4 mg Intravenous Q6H PRN Domenic Polite, MD      . oxyCODONE (Oxy IR/ROXICODONE) immediate release tablet 5 mg  5 mg Oral Q6H PRN Knox Royalty, NP   5 mg at 09/09/17 0545  . pantoprazole (PROTONIX) EC tablet 40 mg  40 mg Oral Daily Domenic Polite, MD   40 mg at 09/09/17 1100  . tamsulosin (FLOMAX) capsule 0.4 mg  0.4 mg Oral QPC supper Domenic Polite, MD      . umeclidinium-vilanterol [facility] ELLIPTA) 62.5-25 MCG/INH 1 puff  1 puff Inhalation Daily Domenic Polite, MD   1 puff at 09/09/17 3241     Discharge Medications: Please see discharge summary for a list of discharge medications.  Relevant Imaging Results:  Relevant Lab Results:   Additional Information SSN [id number]  Estanislado Emms, LCSW

## 2017-09-11 DIAGNOSIS — R627 Adult failure to thrive: Secondary | ICD-10-CM | POA: Diagnosis not present

## 2017-09-11 DIAGNOSIS — I82402 Acute embolism and thrombosis of unspecified deep veins of left lower extremity: Secondary | ICD-10-CM | POA: Diagnosis not present

## 2017-09-11 DIAGNOSIS — M79672 Pain in left foot: Secondary | ICD-10-CM | POA: Diagnosis not present

## 2017-09-11 DIAGNOSIS — I999 Unspecified disorder of circulatory system: Secondary | ICD-10-CM | POA: Diagnosis not present

## 2017-09-16 ENCOUNTER — Ambulatory Visit: Payer: Medicare Other | Admitting: Vascular Surgery

## 2017-10-07 DEATH — deceased

## 2019-09-18 IMAGING — US US RENAL
1 series · 14 of 23 positions shown · non-contrast
Comparison: Abdominal ultrasound May 10, 2016

CLINICAL DATA: Acute kidney injury.

EXAM:
RENAL / URINARY TRACT ULTRASOUND COMPLETE

[Series 1: us renal · 0.24mm/px · 14 of 23 slices shown]
[im 1/23]
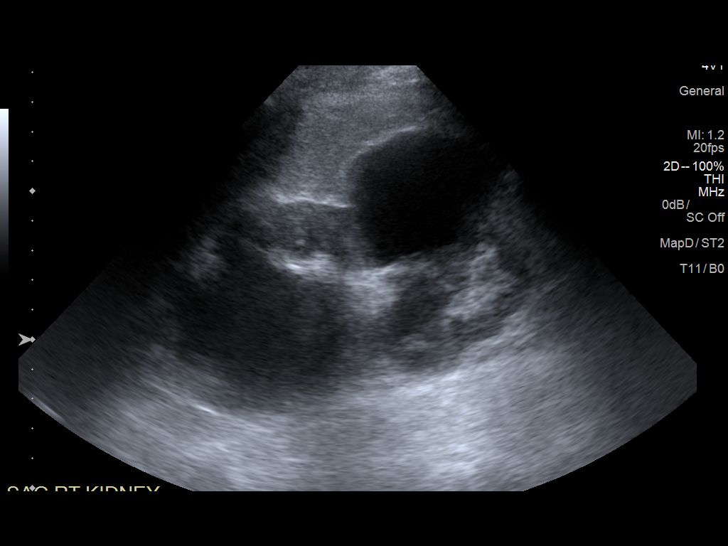
[im 3/23]
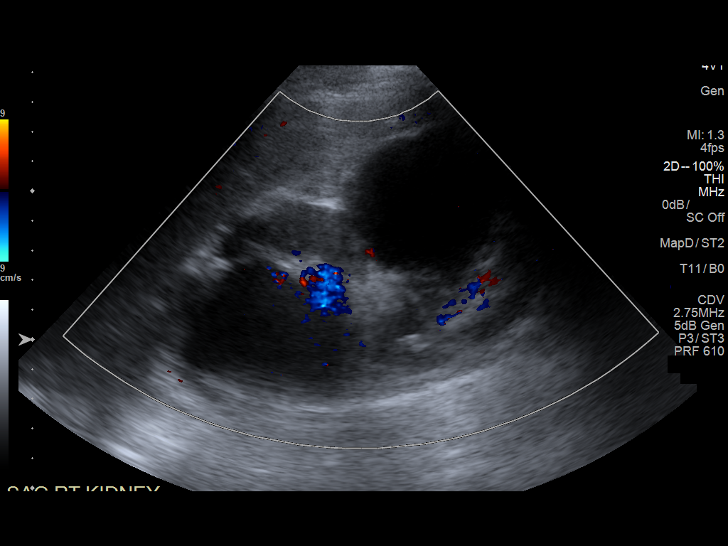
[im 5/23]
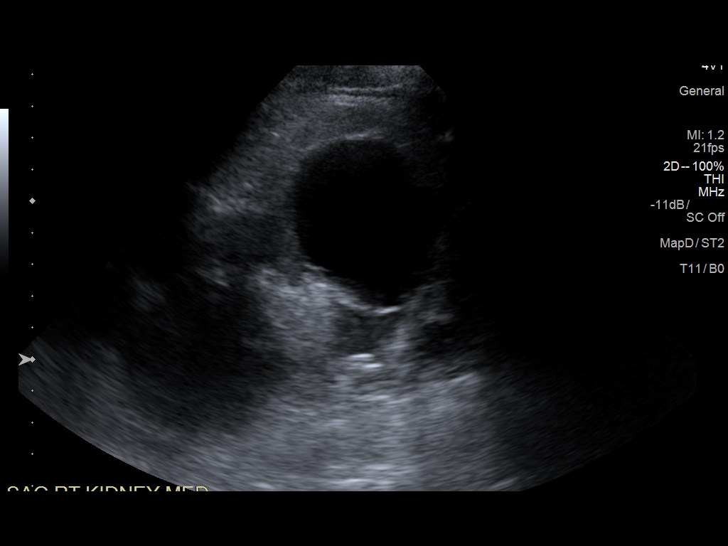
[im 6/23]
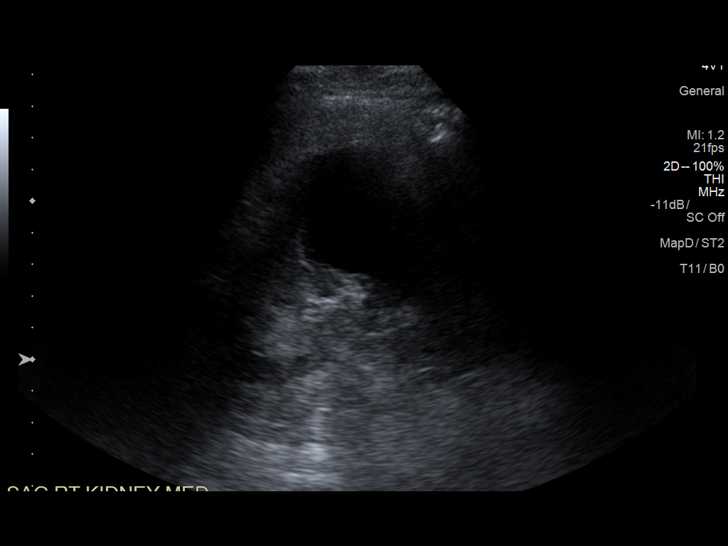
[im 8/23]
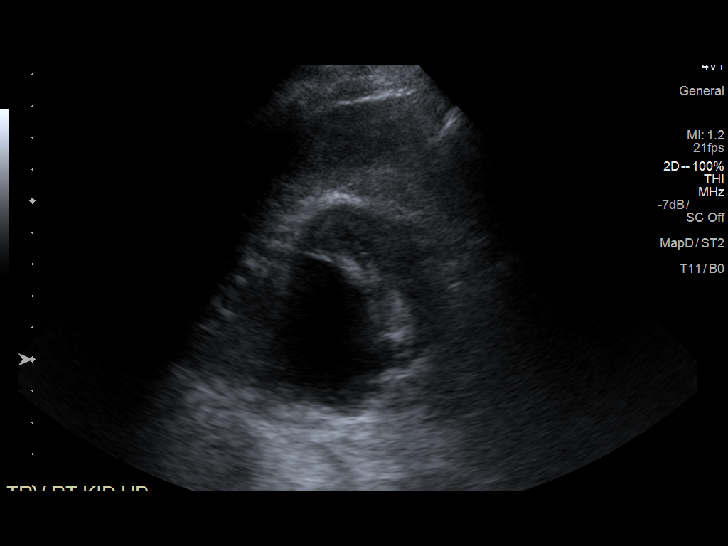
[im 10/23]
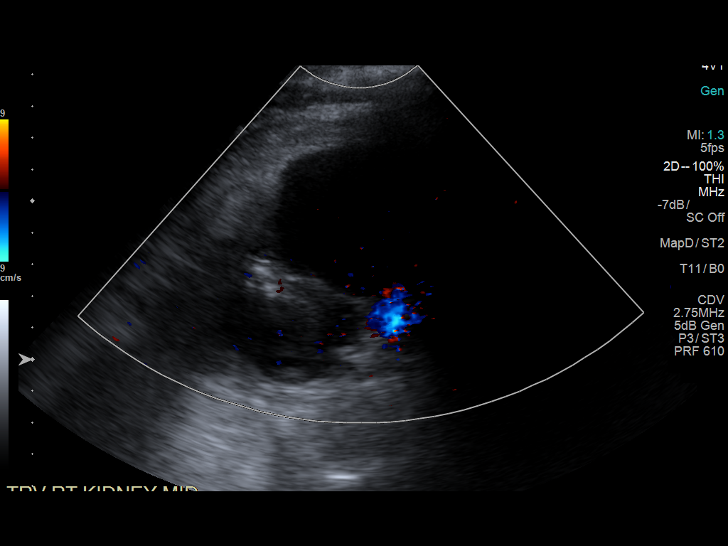
[im 11/23]
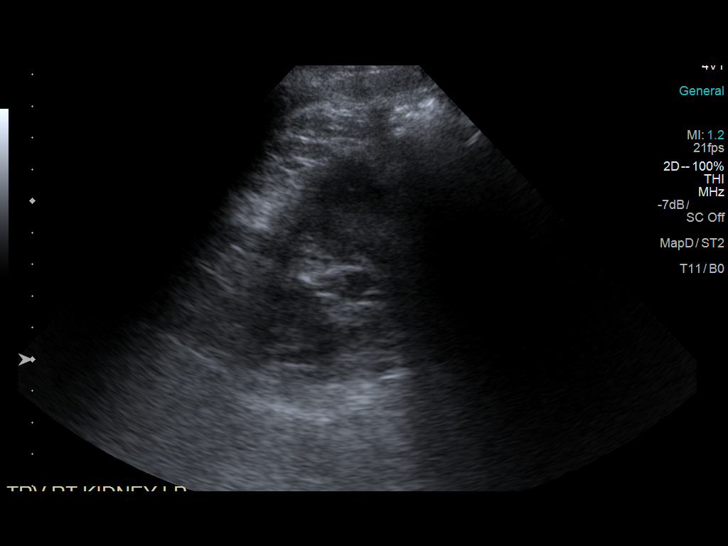
[im 13/23]
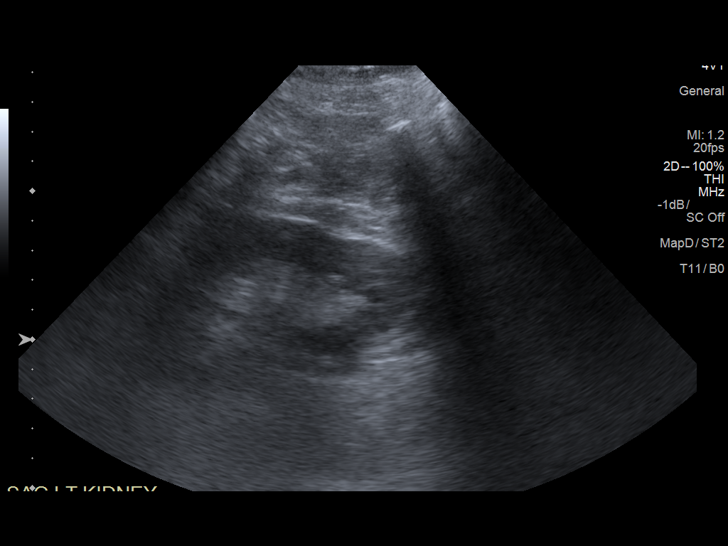
[im 14/23]
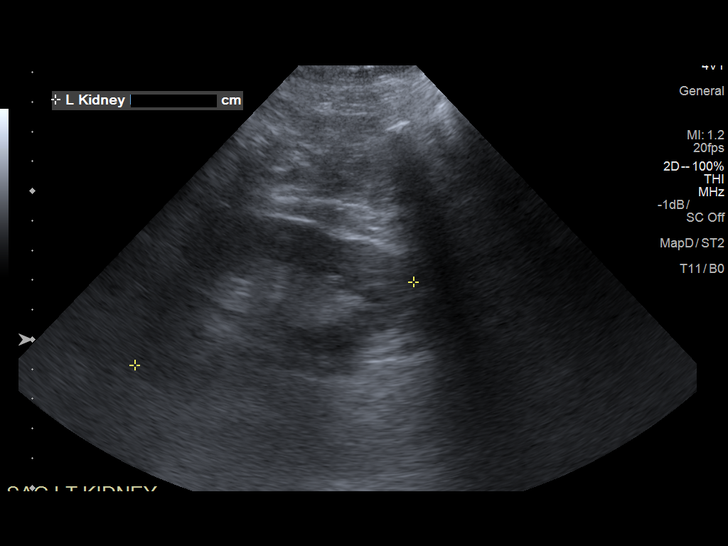
[im 16/23]
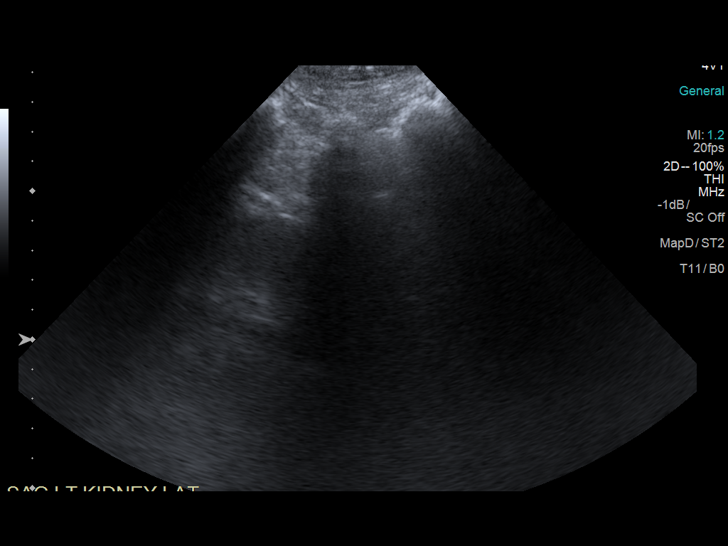
[im 18/23]
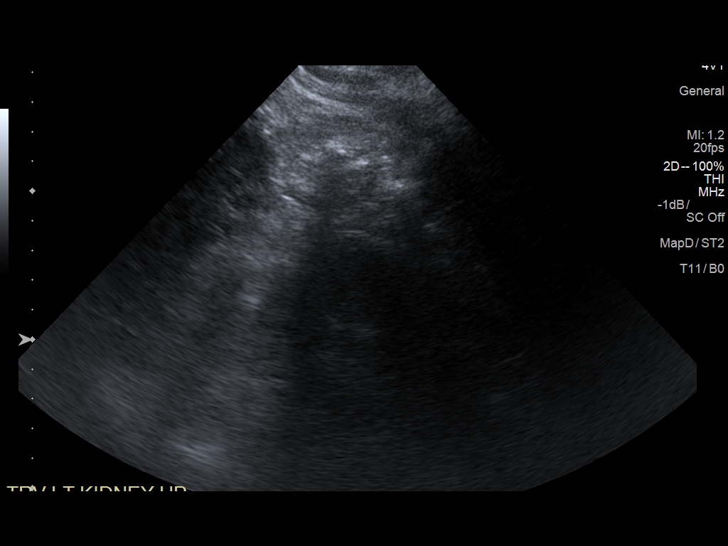
[im 19/23]
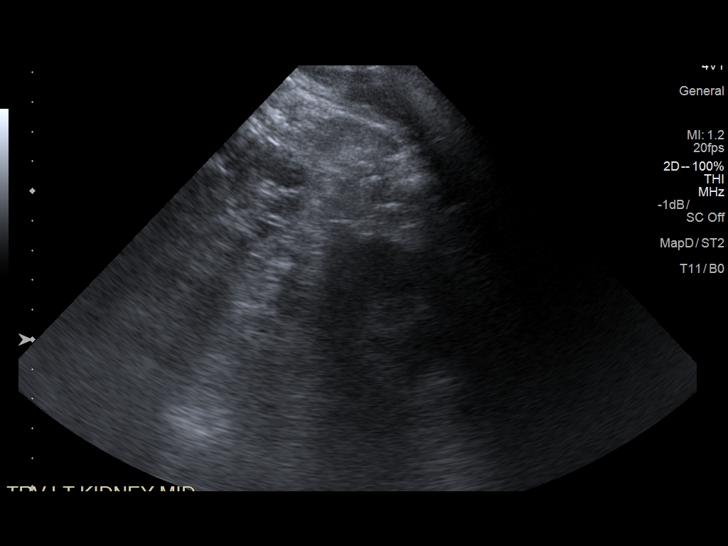
[im 21/23]
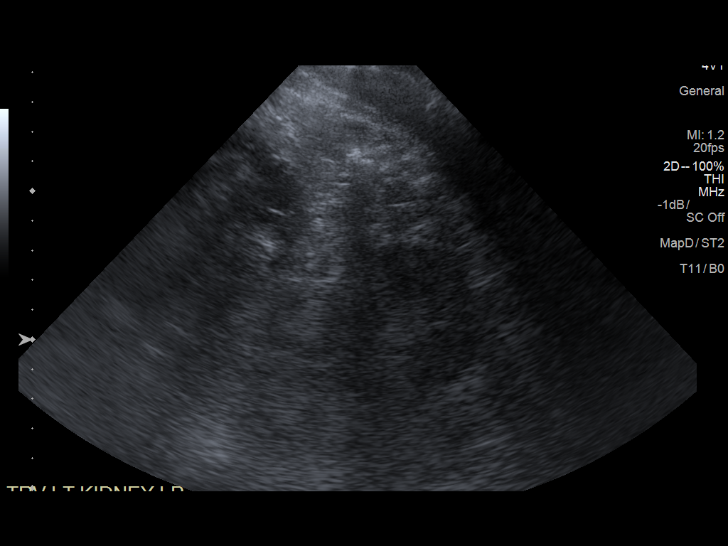
[im 23/23]
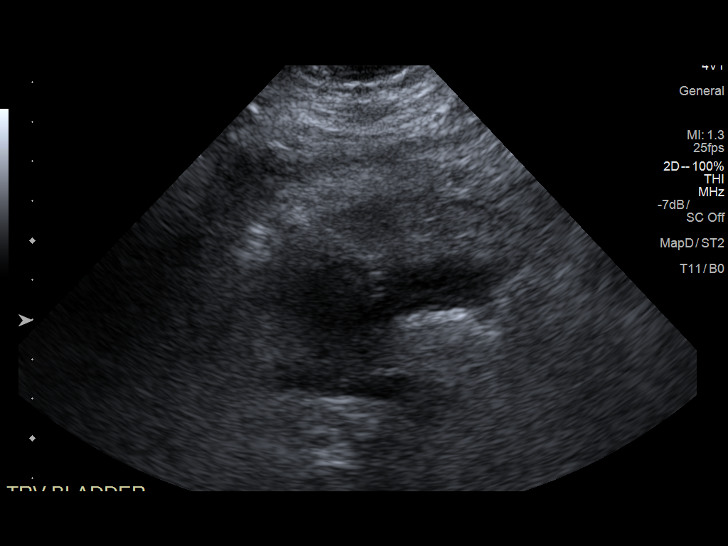

[14 of 23 positions shown; findings below may reference images not displayed]

FINDINGS: Right Kidney:

Length: 11.4 cm. Echogenicity within normal limits. Multiple
anechoic cysts measuring to 4.8 cm. 4.3 cm parapelvic cyst with
debris versus artifact. No suspicious mass or hydronephrosis
visualized.

Left Kidney:

Length: 9.8 cm. Echogenicity within normal limits. No suspicious
mass or hydronephrosis visualized.

Bladder:

Decompressed by Foley catheter.
IMPRESSION: 1. No obstructive uropathy.
2. Multiple RIGHT renal cysts.
